# Patient Record
Sex: Female | Born: 1990 | Race: White | Hispanic: No | Marital: Married | Smoking: Never smoker
Health system: Southern US, Community
[De-identification: ages and names within clinical notes are randomized; demographics above are authoritative.]

## PROBLEM LIST (undated history)

## (undated) ENCOUNTER — Inpatient Hospital Stay (HOSPITAL_COMMUNITY): Payer: Self-pay

## (undated) DIAGNOSIS — N83209 Unspecified ovarian cyst, unspecified side: Secondary | ICD-10-CM

## (undated) DIAGNOSIS — F419 Anxiety disorder, unspecified: Secondary | ICD-10-CM

## (undated) DIAGNOSIS — K219 Gastro-esophageal reflux disease without esophagitis: Secondary | ICD-10-CM

## (undated) DIAGNOSIS — T7840XA Allergy, unspecified, initial encounter: Secondary | ICD-10-CM

## (undated) DIAGNOSIS — I493 Ventricular premature depolarization: Secondary | ICD-10-CM

## (undated) DIAGNOSIS — R51 Headache: Secondary | ICD-10-CM

## (undated) DIAGNOSIS — E079 Disorder of thyroid, unspecified: Secondary | ICD-10-CM

## (undated) DIAGNOSIS — J302 Other seasonal allergic rhinitis: Secondary | ICD-10-CM

## (undated) DIAGNOSIS — K8020 Calculus of gallbladder without cholecystitis without obstruction: Secondary | ICD-10-CM

## (undated) DIAGNOSIS — J40 Bronchitis, not specified as acute or chronic: Secondary | ICD-10-CM

## (undated) DIAGNOSIS — J45909 Unspecified asthma, uncomplicated: Secondary | ICD-10-CM

## (undated) DIAGNOSIS — M1990 Unspecified osteoarthritis, unspecified site: Secondary | ICD-10-CM

## (undated) DIAGNOSIS — F32A Depression, unspecified: Secondary | ICD-10-CM

## (undated) DIAGNOSIS — F329 Major depressive disorder, single episode, unspecified: Secondary | ICD-10-CM

## (undated) DIAGNOSIS — I491 Atrial premature depolarization: Secondary | ICD-10-CM

## (undated) HISTORY — PX: DERMOID CYST  EXCISION: SHX1452

## (undated) HISTORY — DX: Unspecified osteoarthritis, unspecified site: M19.90

## (undated) HISTORY — DX: Atrial premature depolarization: I49.1

## (undated) HISTORY — PX: OTHER SURGICAL HISTORY: SHX169

## (undated) HISTORY — DX: Allergy, unspecified, initial encounter: T78.40XA

## (undated) HISTORY — PX: DIAGNOSTIC LAPAROSCOPY: SUR761

## (undated) HISTORY — DX: Gastro-esophageal reflux disease without esophagitis: K21.9

## (undated) HISTORY — DX: Disorder of thyroid, unspecified: E07.9

## (undated) HISTORY — DX: Calculus of gallbladder without cholecystitis without obstruction: K80.20

## (undated) HISTORY — DX: Ventricular premature depolarization: I49.3

---
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
## 2010-05-09 ENCOUNTER — Ambulatory Visit: Payer: Self-pay | Admitting: Women's Health

## 2010-12-26 ENCOUNTER — Other Ambulatory Visit: Payer: Self-pay | Admitting: *Deleted

## 2010-12-26 DIAGNOSIS — Z3049 Encounter for surveillance of other contraceptives: Secondary | ICD-10-CM

## 2010-12-26 MED ORDER — ETONOGESTREL 68 MG ~~LOC~~ IMPL: DRUG_IMPLANT | Freq: Once | SUBCUTANEOUS | Status: DC

## 2011-01-23 ENCOUNTER — Telehealth: Payer: Self-pay

## 2011-01-23 NOTE — Telephone Encounter (Signed)
PT. C-O DARK HEAVY BLEEDING WITH CRAMPS THAT ARE VARYING IN INTENSITY. I LEFT A VOICE MAIL @ 11:39 A.M. TO GET MORE DETAIL TO HELP PT. SHE CALLED BACK AGAIN @ 11:40 A.M. IN MY VOICE MAIL AND DID NOT LEAVE ANY NEW INFO. FOR ME. FYI PT HAS AN AEX WITH YOU 02-14-11 & ON 02-15-11 REMOVAL & INSERTION OF NEXPLANON WITH DR. Reece Agar. NOT SURE WHAT TO DO SINCE SHE IS NOT GIVING MORE INFO.

## 2011-01-23 NOTE — Telephone Encounter (Signed)
Sherri, please call her and have her take 2-3 over-the-counter Motrin every 6 hours today to see if that will help slow down her menses and help with her cramps.

## 2011-01-23 NOTE — Telephone Encounter (Signed)
PT. NOTIFIED AND BLEEDING HAS SLOWED DOWN FOR NOW. SHE CAN NOT COME BACK FROM [place] CURRENTLY; BUT WILL GO TO [facility] [place] IF SYMPTOMS WORSEN OR PERSIST AGAIN UNTIL SHE CAN COME IN FOR HER APPTS NEXT MONTH AT OUR OFFICE.

## 2011-02-07 ENCOUNTER — Encounter: Payer: Self-pay | Admitting: Gynecology

## 2011-02-14 ENCOUNTER — Encounter: Payer: Self-pay | Admitting: Women's Health

## 2011-02-15 ENCOUNTER — Telehealth: Payer: Self-pay | Admitting: *Deleted

## 2011-02-15 ENCOUNTER — Encounter: Payer: Self-pay | Admitting: Obstetrics and Gynecology

## 2011-02-15 ENCOUNTER — Ambulatory Visit (INDEPENDENT_AMBULATORY_CARE_PROVIDER_SITE_OTHER): Payer: 59 | Admitting: Obstetrics and Gynecology

## 2011-02-15 VITALS — BP 120/80

## 2011-02-15 DIAGNOSIS — Z3046 Encounter for surveillance of implantable subdermal contraceptive: Secondary | ICD-10-CM

## 2011-02-15 DIAGNOSIS — N938 Other specified abnormal uterine and vaginal bleeding: Secondary | ICD-10-CM

## 2011-02-15 DIAGNOSIS — N83209 Unspecified ovarian cyst, unspecified side: Secondary | ICD-10-CM | POA: Insufficient documentation

## 2011-02-15 DIAGNOSIS — Z309 Encounter for contraceptive management, unspecified: Secondary | ICD-10-CM

## 2011-02-15 DIAGNOSIS — N949 Unspecified condition associated with female genital organs and menstrual cycle: Secondary | ICD-10-CM

## 2011-02-15 MED ORDER — HYDROCODONE-ACETAMINOPHEN 5-500 MG PO TABS: 1.0000 | ORAL_TABLET | Freq: Three times a day (TID) | ORAL | Status: DC | PRN

## 2011-02-15 NOTE — Telephone Encounter (Signed)
Patient informed below.  There is no numbness in her arm and not badly swollen.  Just said she had a lot of pain having it but in.

## 2011-02-15 NOTE — Telephone Encounter (Signed)
Patient called c/o of lots of pain with arm since insert of Nexplanon.  Wants to know if we could call in an RX. Had gotten a Vicodin from grandmother since procedure and has started to help.  Can we call in an RX for pain?

## 2011-02-15 NOTE — Telephone Encounter (Signed)
Okay to call and Vicodin 5 mg. Giver numbers 15. Does she have any numbness in her arm? Is her arm badly  swollen?

## 2011-02-15 NOTE — Progress Notes (Signed)
Patient came to see me today to have her old Nexplanon removed and a new one inserted. She is that her current one now for 3 years. Initially she had amenorrhea. She has had meno menorrhagia recently. She saw someone at the [place] where she lives. She has a ovarian cyst and a possible endometrial polyp and is scheduled for followup ultrasound in 3 more weeks. She is currently on a pack of birth control pills to try to control bleeding. She says her bleeding has reduced.  Her old implant is easily palpable. The area was prepped and draped in a sterile manner with Betadine. One percent plain Xylocaine was used to anesthetize the skin. A small incision was made over the distal part of the implant. The implant could be delivered into the incision. It was then removed. It was measured and was the full 4 cm. It was clearly intact. The patient and I discussed a new implant. I offered her a Mirena IUD as an option to better control bleeding. She instead wanted a new Nexplanon placed. She signed the informed consent. Additional Xylocaine was used on the skin. A new Nexplanon was inserted with ease very superficially. Both the patient and I can easily palpate it. A Steri-Strip was placed. A bandage was applied. The patient was given postoperative instructions. I told her that either Dr. Brayton Layman or I would be happy to help her with the ovarian cyst if she would like.

## 2011-02-15 NOTE — Progress Notes (Signed)
Addended by: Bertram Savin A on: 02/15/2011 10:27 AM   Modules accepted: Orders

## 2011-02-16 ENCOUNTER — Encounter (HOSPITAL_COMMUNITY): Payer: Self-pay

## 2011-02-16 ENCOUNTER — Inpatient Hospital Stay (HOSPITAL_COMMUNITY)
Admission: AD | Admit: 2011-02-16 | Discharge: 2011-02-17 | Disposition: A | Discharge status: Home / Self Care | Payer: 59 | Source: Ambulatory Visit | Attending: Gynecology | Admitting: Gynecology

## 2011-02-16 DIAGNOSIS — T859XXA Unspecified complication of internal prosthetic device, implant and graft, initial encounter: Secondary | ICD-10-CM | POA: Insufficient documentation

## 2011-02-16 DIAGNOSIS — R51 Headache: Secondary | ICD-10-CM | POA: Insufficient documentation

## 2011-02-16 HISTORY — DX: Unspecified ovarian cyst, unspecified side: N83.209

## 2011-02-16 HISTORY — DX: Major depressive disorder, single episode, unspecified: F32.9

## 2011-02-16 HISTORY — DX: Depression, unspecified: F32.A

## 2011-02-16 MED ORDER — IBUPROFEN 600 MG PO TABS
600.0000 mg | ORAL_TABLET | Freq: Once | ORAL | Status: AC
  Administered 2011-02-16: 600 mg via ORAL
  Filled 2011-02-16: qty 1

## 2011-02-16 MED ORDER — OXYCODONE-ACETAMINOPHEN 5-325 MG PO TABS
2.0000 | ORAL_TABLET | Freq: Once | ORAL | Status: AC
  Administered 2011-02-16: 2 via ORAL
  Filled 2011-02-16: qty 2

## 2011-02-16 MED ORDER — IBUPROFEN 600 MG PO TABS: 600.0000 mg | ORAL_TABLET | Freq: Four times a day (QID) | ORAL | Status: AC | PRN

## 2011-02-16 MED ORDER — OXYCODONE-ACETAMINOPHEN 5-325 MG PO TABS: 2.0000 | ORAL_TABLET | Freq: Four times a day (QID) | ORAL | Status: AC | PRN

## 2011-02-16 NOTE — Progress Notes (Signed)
Pt also states, " Im also having a headache since he put the implant in, and I've taken Vicodin, but it isn't helping."

## 2011-02-16 NOTE — Progress Notes (Signed)
Patient is here with c/o headache, pain in site of left inner forearm where the nexplanon was placed yesterday by dr Kem Kays. She is upset that her regular dr Lily Peer didn't do the procedure, she states that she felt rushed and was not numbed appropraitely. She states that she had implanon before and didn't have any issues. She has ace wrap wrapped around the site, unable to visualize the nexplanon placement site. She states that she is moving to another city for 2 months and has an appointment with a gyn doctor there for ovarian cyst. Capillary reflex is normal on left arm, it is warm and has normal color.

## 2011-02-16 NOTE — Progress Notes (Signed)
Pt states, " I had nexplanon inserted yesterday by Dr. Oletha Blend. Last night I had swelling in my left arm and my hand were numb and is pale. We unwrapped the bandage and loosed it and it helped some, and the rod is too superficial, and there is a below the incision."

## 2011-02-16 NOTE — ED Provider Notes (Signed)
History   The patient is a 20 y.o. year old female who presents to [facility] reporting pain and swelling at insertion sign of Nexplanon inserted yesterday but Dr. Dianne Dun. She also reports HA and numbness in her hand, both resolved. She denies fever and chills. She had Implanon previously w/out complication. She has taken Ibuprofen today w/ no relief of pain. She called her Dr's office this afternoon and was Rx'd Vicodin. She took one w/ no improvement. She does not currently live in town and will be returning home tomorrow. She has an appointment w/ her Gyn at home on 02/26/11 for F/U ovarian cysts.   CSN: [id number] Arrival date & time: 02/16/2011  8:44 PM  Chief Complaint  Patient presents with  . Post-op Problem  . Headache    (Consider location/radiation/quality/duration/timing/severity/associated sxs/prior treatment) HPI  Past Medical History  Diagnosis Date  . Thyroid disease     Hypothyroid  . Endometriosis   . Ovarian cyst   . Depression     History reviewed. No pertinent past surgical history.  Family History  Problem Relation Age of Onset  . Heart disease Mother     History  Substance Use Topics  . Smoking status: Never Smoker   . Smokeless tobacco: Never Used  . Alcohol Use: No    OB History    Grav Para Term Preterm Abortions TAB SAB Ect Mult Living                  Review of Systems  All other systems reviewed and are negative.    Allergies  Seroquel  Home Medications  No current outpatient prescriptions on file.  BP 132/82  Pulse 69  Temp(Src) 99.1 F (37.3 C) (Oral)  Resp 20  Ht 5' 7.5" (1.715 m)  Wt 85.787 kg (189 lb 2 oz)  BMI 29.18 kg/m2  LMP 01/21/2011  Physical Exam  Constitutional: She is oriented to person, place, and time. She appears well-developed and well-nourished. She appears distressed (mild).  Neck: Normal range of motion.  Cardiovascular: Normal rate.   Pulmonary/Chest: Effort normal.  Neurological: She is alert and  oriented to person, place, and time.  Skin: Skin is warm and dry. Bruising and ecchymosis noted. No erythema. No pallor.          Outer dressing removed. Small pressure dressing left in place. C/D/I. Nexplanon device visible and easily palpable through the skin. Moderate tenderness. No swelling .  Psychiatric: Her mood appears anxious.    ED Course  Procedures (including critical care time)   MDM  Assessment: 1. Shallow Nexplanon placement  Plan: 1. Per consult w/ Dr Nicholas Lose, offer removal vs expectant management w/ pain management. Pt elects to leave Nexplanon in place 2. Call Gyn on Monday to schedule F/U in 1 week or sooner PRN for worsening pain. 3. Rx Ibuprofen and Percocet #30. 4. Infection precautions reviewed   Dorathy Kinsman 02/16/2011 11:46 PM

## 2011-12-20 ENCOUNTER — Emergency Department (HOSPITAL_COMMUNITY): Payer: Worker's Compensation | Attending: Emergency Medicine

## 2011-12-20 ENCOUNTER — Emergency Department (HOSPITAL_COMMUNITY)
Admission: EM | Admit: 2011-12-20 | Discharge: 2011-12-20 | Disposition: A | Discharge status: Home / Self Care | Payer: Worker's Compensation | Attending: Emergency Medicine | Admitting: Emergency Medicine

## 2011-12-20 ENCOUNTER — Encounter (HOSPITAL_COMMUNITY): Payer: Self-pay | Admitting: Emergency Medicine

## 2011-12-20 DIAGNOSIS — M25539 Pain in unspecified wrist: Secondary | ICD-10-CM

## 2011-12-20 DIAGNOSIS — M25579 Pain in unspecified ankle and joints of unspecified foot: Secondary | ICD-10-CM | POA: Insufficient documentation

## 2011-12-20 DIAGNOSIS — X500XXA Overexertion from strenuous movement or load, initial encounter: Secondary | ICD-10-CM | POA: Insufficient documentation

## 2011-12-20 DIAGNOSIS — F329 Major depressive disorder, single episode, unspecified: Secondary | ICD-10-CM | POA: Insufficient documentation

## 2011-12-20 DIAGNOSIS — F3289 Other specified depressive episodes: Secondary | ICD-10-CM | POA: Insufficient documentation

## 2011-12-20 HISTORY — DX: Unspecified asthma, uncomplicated: J45.909

## 2011-12-20 MED ORDER — HYDROCODONE-ACETAMINOPHEN 5-325 MG PO TABS: 1.0000 | ORAL_TABLET | ORAL | Status: AC | PRN

## 2011-12-20 MED ORDER — IBUPROFEN 800 MG PO TABS: 800.0000 mg | ORAL_TABLET | Freq: Three times a day (TID) | ORAL | Status: AC | PRN

## 2011-12-20 MED ORDER — IBUPROFEN 800 MG PO TABS
800.0000 mg | ORAL_TABLET | Freq: Once | ORAL | Status: AC
  Administered 2011-12-20: 800 mg via ORAL
  Filled 2011-12-20: qty 1

## 2011-12-20 NOTE — ED Provider Notes (Signed)
Medical screening examination/treatment/procedure(s) were performed by non-physician practitioner and as supervising physician I was immediately available for consultation/collaboration.   Adryan Druckenmiller, MD 12/20/11 2357 

## 2011-12-20 NOTE — ED Notes (Signed)
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

## 2011-12-20 NOTE — ED Provider Notes (Signed)
History     CSN: [id number]  Arrival date & time 12/20/11  2007   First MD Initiated Contact with Patient 12/20/11 2154      Chief Complaint  Patient presents with  . 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  States the pain and "pop" involved the middle of her dorsal wrist.  Reports pain on dorsal and ventral aspects of wrist, centrally.  States her entire hand feels tingly.  Denies weakness of her hand.  Denies other injury.    The history is provided by the patient. The history is limited by the condition of the patient.    Past Medical History  Diagnosis Date  . Thyroid disease     Hypothyroid  . Endometriosis   . Ovarian cyst   . Depression   . Asthma     Past Surgical History  Procedure Date  . Dermoid cyst  excision     Family History  Problem Relation Age of Onset  . Heart disease Mother   . Hypertension Mother   . Hyperlipidemia Father   . Diabetes Other   . Hypertension Other     History  Substance Use Topics  . Smoking status: Never Smoker   . Smokeless tobacco: Never Used  . Alcohol Use: No    OB History    Grav Para Term Preterm Abortions TAB SAB Ect Mult Living                  Review of Systems  Skin: Negative for color change, pallor and wound.  Neurological: Negative for weakness and numbness.    Allergies  Seroquel  Home Medications   Current Outpatient Rx  Name Route Sig Dispense Refill  . ALPRAZOLAM 0.5 MG PO TABS Oral Take 0.5 mg by mouth at bedtime as needed. Anxiety    . DIPHENHYDRAMINE HCL 25 MG PO CAPS Oral Take 25 mg by mouth daily. Patient takes for allergies     . MONTELUKAST SODIUM 10 MG PO TABS Oral Take 10 mg by mouth at bedtime.      . SERTRALINE HCL 100 MG PO TABS Oral Take 100 mg by mouth  daily.      BP 124/96  Pulse 86  Temp 98.9 F (37.2 C) (Oral)  Resp 20  SpO2 100%  Physical Exam  Nursing note and vitals reviewed. Constitutional: She appears well-developed and well-nourished. No distress.  HENT:  Head: Normocephalic and atraumatic.  Neck: Neck supple.  Pulmonary/Chest: Effort normal.  Musculoskeletal:       Right wrist: She exhibits tenderness and swelling. She exhibits normal range of motion, no bony tenderness, no crepitus, no deformity and no laceration.       Pt with central tenderness over dorsal wrist.  Pt with full AROM of wrist.  Radial pulse is intact.  Grip strength is normal.  No bony tenderness of hand.    Neurological: She is alert.  Skin: She is not diaphoretic.    ED Course  Procedures (including critical care time)  Labs Reviewed - No data to display Dg Wrist Complete Right  12/20/2011  *RADIOLOGY REPORT*  Clinical Data: pain, trauma  RIGHT WRIST - COMPLETE 3+ VIEW  Comparison: The wrist films 06/11/2007  Findings: Radiocarpal joint is intact.  No evidence of carpal fracture.  No  dislocation.  IMPRESSION: No wrist fracture.  Original Report Authenticated By: Genevive Bi, M.D.   Dg Hand Complete Right  12/20/2011  *RADIOLOGY REPORT*  Clinical Data: Wrist pain  RIGHT HAND - COMPLETE 3+ VIEW  Comparison: Wrist films 06/11/2007  Findings: The radiocarpal joint is intact.  No evidence of carpal fracture or metacarpal fracture.  IMPRESSION: No wrist fracture.  No hand fracture.  Original Report Authenticated By: Genevive Bi, M.D.     1. Wrist pain       MDM  Pt with right wrist pain after a child pulled away from her.  No specific or localized bony tenderness. Xrays are negative.  Pt is neurovascularly intact, full AROM.  Pt placed in velcro splint, d/c home with pain medication and PCP resources for follow up.  Discussed all results with patient.  Pt given return precautions.  Pt verbalizes understanding and agrees with plan.            [place], [place] 12/20/11 2356

## 2012-04-05 ENCOUNTER — Ambulatory Visit (INDEPENDENT_AMBULATORY_CARE_PROVIDER_SITE_OTHER): Payer: PRIVATE HEALTH INSURANCE | Admitting: Family Medicine

## 2012-04-05 VITALS — BP 120/80 | HR 85 | Temp 98.1°F | Resp 16 | Ht 67.5 in | Wt 196.0 lb

## 2012-04-05 DIAGNOSIS — N390 Urinary tract infection, site not specified: Secondary | ICD-10-CM

## 2012-04-05 DIAGNOSIS — R300 Dysuria: Secondary | ICD-10-CM

## 2012-04-05 DIAGNOSIS — J069 Acute upper respiratory infection, unspecified: Secondary | ICD-10-CM

## 2012-04-05 LAB — POCT URINALYSIS DIPSTICK
Glucose, UA: NEGATIVE
Ketones, UA: NEGATIVE
Spec Grav, UA: 1.015

## 2012-04-05 LAB — POCT UA - MICROSCOPIC ONLY: Mucus, UA: NEGATIVE

## 2012-04-05 MED ORDER — PHENAZOPYRIDINE HCL 200 MG PO TABS: 200.0000 mg | ORAL_TABLET | Freq: Three times a day (TID) | ORAL | Status: DC | PRN

## 2012-04-05 MED ORDER — FLUCONAZOLE 150 MG PO TABS: 150.0000 mg | ORAL_TABLET | Freq: Once | ORAL | Status: DC

## 2012-04-05 MED ORDER — AMOXICILLIN 500 MG PO TABS: 500.0000 mg | ORAL_TABLET | Freq: Three times a day (TID) | ORAL | Status: DC

## 2012-04-05 NOTE — Progress Notes (Signed)
Subjective:    Patient ID: Erika Armstrong, female    DOB: [birth year], 21 y.o.   MRN: [id number] Chief Complaint  Patient presents with  . Urinary Tract Infection    2 weeks  . Sore Throat    1 week  . URI    1 week    HPI  Has had for 2 wks w/ dysuria and constant urge w/ hesitancy - gets several times a yr. Also gets freq yeast infection w/ occ itching, no discharge. Has chronic constipation, sexually active, no douching or bubble baths.  Had some Bactrim at home from prior infection - took 1 pill twice a day for a wk which helped some but sxs did not totally go away and since she went off it, sxs has gotten much worse. Tried her on some meds when she was little but nothing ever worked for her. Dev nasal cong a wk ago but progressed into pharyngitis and laryngitis. Having yellow mucous and productive cough w/ chest pain.  Sleeping ok.  No using any otc meds other than benadryl which she takes normally. Also had some Rt ear pressure. Past Medical History  Diagnosis Date  . Thyroid disease     Hypothyroid  . Endometriosis   . Ovarian cyst   . Depression   . Asthma   . Allergy   . Arthritis    Current Outpatient Prescriptions on File Prior to Visit  Medication Sig Dispense Refill  . ALPRAZolam (XANAX) 0.5 MG tablet Take 0.5 mg by mouth at bedtime as needed. Anxiety      . diphenhydrAMINE (BENADRYL) 25 mg capsule Take 25 mg by mouth daily. Patient takes for allergies       . montelukast (SINGULAIR) 10 MG tablet Take 10 mg by mouth at bedtime.        . sertraline (ZOLOFT) 100 MG tablet Take 100 mg by mouth daily.       Current Facility-Administered Medications on File Prior to Visit  Medication Dose Route Frequency Provider Last Rate Last Dose  . Etonogestrel IMPL   Subcutaneous Once Trellis Paganini, MD        Review of Systems  Constitutional: Negative for fever, chills, diaphoresis, activity change, appetite change, fatigue and unexpected weight change.  HENT: Positive for  ear pain, congestion, sore throat, rhinorrhea, trouble swallowing and voice change.   Respiratory: Positive for cough. Negative for shortness of breath and wheezing.   Cardiovascular: Positive for chest pain.  Gastrointestinal: Positive for constipation. Negative for abdominal pain, diarrhea, blood in stool, anal bleeding and rectal pain.  Genitourinary: Positive for dysuria, urgency and difficulty urinating. Negative for frequency, hematuria, decreased urine volume, vaginal bleeding, vaginal discharge, genital sores, vaginal pain, menstrual problem, pelvic pain and dyspareunia.  Musculoskeletal: Negative for gait problem.  Skin: Negative for rash.  Hematological: Negative for adenopathy.  Psychiatric/Behavioral: Negative for sleep disturbance. The patient is not nervous/anxious.       BP 120/80  Pulse 85  Temp(Src) 98.1 F (36.7 C) (Oral)  Resp 16  Ht 5' 7.5" (1.715 m)  Wt 196 lb (88.905 kg)  BMI 30.23 kg/m2  SpO2 100% Objective:   Physical Exam  Constitutional: She is oriented to person, place, and time. She appears well-developed and well-nourished. No distress.  HENT:  Head: Normocephalic and atraumatic.  Right Ear: Tympanic membrane, external ear and ear canal normal.  Left Ear: Tympanic membrane, external ear and ear canal normal.  Nose: Nose normal. No mucosal edema or rhinorrhea.  Mouth/Throat: Uvula is midline, oropharynx is clear and moist and mucous membranes are normal. No oropharyngeal exudate.  Eyes: Conjunctivae are normal. Right eye exhibits no discharge. Left eye exhibits no discharge. No scleral icterus.  Neck: Normal range of motion. Neck supple.  Cardiovascular: Normal rate, regular rhythm, normal heart sounds and intact distal pulses.   Pulmonary/Chest: Effort normal and breath sounds normal.  Abdominal: Soft. Bowel sounds are normal. She exhibits no distension and no mass. There is no hepatosplenomegaly. There is tenderness. There is no rebound, no guarding and  no CVA tenderness.  Lymphadenopathy:    She has no cervical adenopathy.  Neurological: She is alert and oriented to person, place, and time.  Skin: Skin is warm and dry. She is not diaphoretic. No erythema.  Psychiatric: She has a normal mood and affect. Her behavior is normal.      Results for orders placed in visit on 04/05/12  URINE CULTURE      Result Value Range   Colony Count 40,000 COLONIES/ML     Organism ID, Bacteria Multiple bacterial morphotypes present, none     Organism ID, Bacteria predominant. Suggest appropriate recollection if      Organism ID, Bacteria clinically indicated.    POCT URINALYSIS DIPSTICK      Result Value Range   Color, UA yellow     Clarity, UA cloudy     Glucose, UA neg     Bilirubin, UA neg     Ketones, UA neg     Spec Grav, UA 1.015     Blood, UA small     pH, UA 5.0     Protein, UA neg     Urobilinogen, UA 0.2     Nitrite, UA neg     Leukocytes, UA large (3+)    POCT UA - MICROSCOPIC ONLY      Result Value Range   WBC, Ur, HPF, POC 30-40     RBC, urine, microscopic 3-6     Bacteria, U Microscopic 1+     Mucus, UA neg     Epithelial cells, urine per micros 5-8     Crystals, Ur, HPF, POC neg     Casts, Ur, LPF, POC neg     Yeast, UA positive      Assessment & Plan:  Dysuria - Plan: POCT urinalysis dipstick, POCT UA - Microscopic Only, Urine culture, phenazopyridine (PYRIDIUM) 200 MG tablet  UTI (lower urinary tract infection) - Plan: amoxicillin (AMOXIL) 500 MG tablet  URI (upper respiratory infection)  Meds ordered this encounter  Medications  . amoxicillin (AMOXIL) 500 MG tablet    Sig: Take 1 tablet (500 mg total) by mouth 3 (three) times daily.    Dispense:  21 tablet    Refill:  0  . phenazopyridine (PYRIDIUM) 200 MG tablet    Sig: Take 1 tablet (200 mg total) by mouth 3 (three) times daily as needed for pain.    Dispense:  10 tablet    Refill:  0  .  fluconazole (DIFLUCAN) 150 MG tablet    Sig: Take 1 tablet (150 mg  total) by mouth once. Repeat if needed    Dispense:  2 tablet    Refill:  0

## 2012-04-06 LAB — URINE CULTURE

## 2012-04-12 ENCOUNTER — Telehealth: Payer: Self-pay

## 2012-04-12 NOTE — Telephone Encounter (Signed)
Patient was being treated with antibiotics for UTI and URI. She is out of antibiotic and still has symptoms. Dr Clelia Croft ordered labs to make sure it actually is a UTI but patient has not heard back about results. She would like to know what to do next. (Dr Clelia Croft will be out of office for the week)  Best [phone]

## 2012-04-12 NOTE — Telephone Encounter (Signed)
If she still has UTI symptoms - she should RTC because she may have a vaginal infection that is causing her urinary symptoms.

## 2012-04-12 NOTE — Telephone Encounter (Signed)
Please check lab results. They are back and in Dr. Clelia Croft box and she is out of the office.

## 2012-04-12 NOTE — Telephone Encounter (Signed)
Pt states that she is still coughing and having a runny nose, urinary frequency and a "ting".  Per Maralyn Sago get Mucinex and RTC since she may vaginal infection. Advised pt to RTC.

## 2012-04-24 ENCOUNTER — Telehealth: Payer: Self-pay | Admitting: *Deleted

## 2012-04-24 ENCOUNTER — Other Ambulatory Visit: Payer: Self-pay | Admitting: Gynecology

## 2012-04-24 ENCOUNTER — Encounter: Payer: Self-pay | Admitting: Gynecology

## 2012-04-24 ENCOUNTER — Ambulatory Visit (INDEPENDENT_AMBULATORY_CARE_PROVIDER_SITE_OTHER): Payer: PRIVATE HEALTH INSURANCE | Admitting: Gynecology

## 2012-04-24 ENCOUNTER — Telehealth: Payer: Self-pay | Admitting: Gynecology

## 2012-04-24 VITALS — BP 124/80

## 2012-04-24 DIAGNOSIS — N898 Other specified noninflammatory disorders of vagina: Secondary | ICD-10-CM

## 2012-04-24 DIAGNOSIS — D279 Benign neoplasm of unspecified ovary: Secondary | ICD-10-CM | POA: Insufficient documentation

## 2012-04-24 DIAGNOSIS — Z86018 Personal history of other benign neoplasm: Secondary | ICD-10-CM

## 2012-04-24 DIAGNOSIS — R102 Pelvic and perineal pain: Secondary | ICD-10-CM

## 2012-04-24 DIAGNOSIS — Z3049 Encounter for surveillance of other contraceptives: Secondary | ICD-10-CM

## 2012-04-24 DIAGNOSIS — Z23 Encounter for immunization: Secondary | ICD-10-CM

## 2012-04-24 DIAGNOSIS — N949 Unspecified condition associated with female genital organs and menstrual cycle: Secondary | ICD-10-CM

## 2012-04-24 DIAGNOSIS — R350 Frequency of micturition: Secondary | ICD-10-CM

## 2012-04-24 DIAGNOSIS — L259 Unspecified contact dermatitis, unspecified cause: Secondary | ICD-10-CM

## 2012-04-24 DIAGNOSIS — Z9889 Other specified postprocedural states: Secondary | ICD-10-CM

## 2012-04-24 LAB — URINALYSIS W MICROSCOPIC + REFLEX CULTURE
Casts: NONE SEEN
Crystals: NONE SEEN
Glucose, UA: NEGATIVE mg/dL
Hgb urine dipstick: NEGATIVE
Leukocytes, UA: NEGATIVE
Specific Gravity, Urine: 1.02 (ref 1.005–1.030)
WBC, UA: NONE SEEN WBC/hpf (ref <3)
pH: 7.5 (ref 5.0–8.0)

## 2012-04-24 LAB — WET PREP FOR TRICH, YEAST, CLUE
Clue Cells Wet Prep HPF POC: NONE SEEN
Yeast Wet Prep HPF POC: NONE SEEN

## 2012-04-24 LAB — CBC WITH DIFFERENTIAL/PLATELET
Basophils Absolute: 0 10*3/uL (ref 0.0–0.1)
Basophils Relative: 0 % (ref 0–1)
MCHC: 34.6 g/dL (ref 30.0–36.0)
Monocytes Absolute: 0.7 10*3/uL (ref 0.1–1.0)
Neutro Abs: 4.9 10*3/uL (ref 1.7–7.7)
Neutrophils Relative %: 60 % (ref 43–77)
RDW: 13.1 % (ref 11.5–15.5)

## 2012-04-24 MED ORDER — FLUCONAZOLE 150 MG PO TABS: 150.0000 mg | ORAL_TABLET | Freq: Once | ORAL | Status: DC

## 2012-04-24 MED ORDER — LEVONORGESTREL 20 MCG/24HR IU IUD: INTRAUTERINE_SYSTEM | Freq: Once | INTRAUTERINE | Status: DC

## 2012-04-24 MED ORDER — HYDROCORTISONE VALERATE 0.2 % EX OINT: TOPICAL_OINTMENT | Freq: Two times a day (BID) | CUTANEOUS | Status: DC

## 2012-04-24 NOTE — Telephone Encounter (Signed)
Reassure her that it should be no problem.

## 2012-04-24 NOTE — Telephone Encounter (Signed)
Pt informed with the below. 

## 2012-04-24 NOTE — Telephone Encounter (Signed)
Pt advised that Mirena & insertion covered under her $25 copay per [employer]. JF to insert on 04/27/12/wl

## 2012-04-24 NOTE — Telephone Encounter (Signed)
Pt was seen today and discussed removing nexplanon IUD in the near future and placing a Mirena IUD. Pt said she forgot to mention to you that she is a 2nd generation DES baby would that affect anything? As well has her uterus being anteverted? She is worried about placement of IUD issues. Please advise

## 2012-04-24 NOTE — Progress Notes (Addendum)
Patient is a 21 year old who presented to the office with several complaints. The first concern of her cyst is her left upper, she had a nexplanon placed that she feels is very superficial and at times causes her irritation. She is having no menstrual periods. She is sexually active. She also had symptoms of frequency and this year he and she stated that she had some antibiotic at home that she took twice a day for one week. Her symptoms continued and she went to an urgent care and they placed on amoxicillin twice a day for one week. They had also given her some anti-spasmodic agent which she does not recall the name and last night when she took it it made her sick her stomach with some nausea and vomiting. She was also complaining of some vulvar pruritus and she had been taking so much antibiotic. She states that occasionally she has had dyspareunia.  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 Patient denied any fever recently.  Exam: Left upper arm medial aspect nexplanon wasn't visible superficial underneath the skin with the distal portion of the rod causing perhaps some mild irritation.  Abdomen: Soft nontender no rebound or guarding although her pelvis slight tenderness more on the right than on the left. Pelvic exam: Bartholin urethra Skene glands within normal limits Vagina: Cranial-like discharge was noted so wet prep along with a GC and Chlamydia culture was obtained Cervix: As above Uterus: Anteverted slightly tender but no discernible mass per se on either adnexa. Rectal exam deferred.  Patient received flu vaccine today Urine pregnancy test pending at time of this dictation Wet prep negative  Assessment/plan: Patient has been on antibiotics for almost 2 weeks. We will wait for the urine culture as well as the GC and Chlamydia culture. 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 I've given her literature information on the Mirena IUD. I've called in a prescription for [place] cream to apply to her upper arm for the contact dermatitis. We discussed about removing her nexplanon IUD in the near future and placing a Mirena IUD intrauterine instead.  UPT negative

## 2012-04-24 NOTE — Patient Instructions (Addendum)
Intrauterine Device Information  An intrauterine device (IUD) is inserted into your uterus and prevents pregnancy. There are 2 types of IUDs available:  · Copper IUD. This type of IUD is wrapped in copper wire and is placed inside the uterus. Copper makes the uterus and fallopian tubes produce a fluid that kills sperm. The copper IUD can stay in place for 10 years.  · Hormone IUD. This type of IUD contains the hormone progestin (synthetic progesterone). The hormone thickens the cervical mucus and prevents sperm from entering the uterus, and it also thins the uterine lining to prevent implantation of a fertilized egg. The hormone can weaken or kill the sperm that get into the uterus. The hormone IUD can stay in place for 5 years.  Your caregiver will make sure you are a good candidate for a contraceptive IUD. Discuss with your caregiver the possible side effects.  ADVANTAGES  · It is highly effective, reversible, long-acting, and low maintenance.  · There are no estrogen-related side effects.  · An IUD can be used when breastfeeding.  · It is not associated with weight gain.  · It works immediately after insertion.  · The copper IUD does not interfere with your female hormones.  · The progesterone IUD can make heavy menstrual periods lighter.  · The progesterone IUD can be used for 5 years.  · The copper IUD can be used for 10 years.  DISADVANTAGES  · The progesterone IUD can be associated with irregular bleeding patterns.  · The copper IUD can make your menstrual flow heavier and more painful.  · You may experience cramping and vaginal bleeding after insertion.  Document Released: 04/02/2004 Document Revised: 07/22/2011 Document Reviewed: 09/01/2010  [employer]® Patient Information ©2013 [employer].

## 2012-04-24 NOTE — Addendum Note (Signed)
Addended by: Richardson Chiquito on: 04/24/2012 02:36 PM   Modules accepted: Orders

## 2012-04-24 NOTE — Addendum Note (Signed)
Addended by: Bertram Savin A on: 04/24/2012 02:19 PM   Modules accepted: Orders

## 2012-04-25 LAB — GC/CHLAMYDIA PROBE AMP: CT Probe RNA: NEGATIVE

## 2012-04-26 LAB — URINE CULTURE: Colony Count: 45000

## 2012-04-27 ENCOUNTER — Telehealth: Payer: Self-pay | Admitting: Gynecology

## 2012-04-27 ENCOUNTER — Encounter: Payer: Self-pay | Admitting: Gynecology

## 2012-04-27 ENCOUNTER — Ambulatory Visit (INDEPENDENT_AMBULATORY_CARE_PROVIDER_SITE_OTHER): Payer: PRIVATE HEALTH INSURANCE | Admitting: Gynecology

## 2012-04-27 ENCOUNTER — Ambulatory Visit (INDEPENDENT_AMBULATORY_CARE_PROVIDER_SITE_OTHER): Payer: PRIVATE HEALTH INSURANCE

## 2012-04-27 ENCOUNTER — Other Ambulatory Visit: Payer: Self-pay | Admitting: Gynecology

## 2012-04-27 DIAGNOSIS — R1900 Intra-abdominal and pelvic swelling, mass and lump, unspecified site: Secondary | ICD-10-CM

## 2012-04-27 DIAGNOSIS — N858 Other specified noninflammatory disorders of uterus: Secondary | ICD-10-CM

## 2012-04-27 DIAGNOSIS — N83202 Unspecified ovarian cyst, left side: Secondary | ICD-10-CM

## 2012-04-27 DIAGNOSIS — N949 Unspecified condition associated with female genital organs and menstrual cycle: Secondary | ICD-10-CM

## 2012-04-27 DIAGNOSIS — R102 Pelvic and perineal pain: Secondary | ICD-10-CM

## 2012-04-27 DIAGNOSIS — N83209 Unspecified ovarian cyst, unspecified side: Secondary | ICD-10-CM

## 2012-04-27 DIAGNOSIS — Z9889 Other specified postprocedural states: Secondary | ICD-10-CM

## 2012-04-27 DIAGNOSIS — D3910 Neoplasm of uncertain behavior of unspecified ovary: Secondary | ICD-10-CM

## 2012-04-27 DIAGNOSIS — N859 Noninflammatory disorder of uterus, unspecified: Secondary | ICD-10-CM

## 2012-04-27 MED ORDER — OXYCODONE-ACETAMINOPHEN 5-500 MG PO CAPS: 1.0000 | ORAL_CAPSULE | ORAL | Status: DC | PRN

## 2012-04-27 NOTE — Addendum Note (Signed)
Addended by: Ok Edwards on: 04/27/2012 11:29 AM   Modules accepted: Orders

## 2012-04-27 NOTE — Telephone Encounter (Signed)
Patient said she was in earlier and had u/s.  She said you gave her pain Rx and told her not to drive with using it.  Procedure today caused pain and she took one and could not drive to work.  Her work is asking her to have doctors note faxed to them for her absence today.  If that is okay with you I will prepare letter for you to sign.

## 2012-04-27 NOTE — Telephone Encounter (Signed)
Yes this will be fine thank you

## 2012-04-27 NOTE — Progress Notes (Signed)
Patient is a 21 year old who was seen in the office on December 13 complaining of some irritation on her upper left forearm where she had been nexplanon put in. She is having no menstrual cycles. She is sexually active. Patient been treated for urinary tract infection at the urgent care. She complained of some vulvar pruritus and had wet prep GC and Chlamydia culture here in our office which was negative.  Patient brought to my attention that in 2012 in [facility] she had a laparoscopic right ovarian cystectomy for dermoid cyst.  For the mild irritation at the nexplanon site she was given a prescription for Westcort cream to apply twice a day for 7-10 days which she just started yesterday.  She presented to the office for an ultrasound due to the fullness noted on exam on recent office visit in tenderness noted. Ultrasound today as follows:  Uterus measures 7 x 4.3 x 2.2 cm with endometrial stripe 1.5 mm. Anteverted uterus displaced to the right. Fluid-filled endometrial cavity. Right ovary normal follicles less than 12 mm. Left ovarian rim of tissue seen with follicles. Left adnexa with a solid cystic mass echogenic avascular measuring 4.9 x 4.1 x 4.6 cm some left ovarian arterial blood flow was seen. Mass was otherwise avascular.  Assessment/plan: Patient prior history of dermoid cyst of right ovary. Patient will return back to the office in 4-6 weeks for followup ultrasound if cyst is persistent we will proceed with laparoscopic ovarian cystectomy of what appears to be another dermoid cysts on this contralateral side from the side where she had had a previous ovarian cystectomy. Literature information on laparoscopy and ovarian cyst provided. We are going to get a medical release so we can review the previous operative note that was done in [facility] last year or where she had a dermoid cyst removed from her right. A CA 125 will be drawn today the limitations discussed  with the patient. We also discussed that at the same time that she is under anesthesia to remove her nexplanon and then on her postop visit we can place a Mirena IUD.

## 2012-04-27 NOTE — Telephone Encounter (Signed)
Letter prepared and faxed. Copy is in e-chart. Patient informed.

## 2012-04-27 NOTE — Patient Instructions (Signed)
Ovarian Cyst The ovaries are small organs that are on each side of the uterus. The ovaries are the organs that produce the female hormones, estrogen and progesterone. An ovarian cyst is a sac filled with fluid that can vary in its size. It is normal for a small cyst to form in women who are in the childbearing age and who have menstrual periods. This type of cyst is called a follicle cyst that becomes an ovulation cyst (corpus luteum cyst) after it produces the women's egg. It later goes away on its own if the woman does not become pregnant. There are other kinds of ovarian cysts that may cause problems and may need to be treated. The most serious problem is a cyst with cancer. It should be noted that menopausal women who have an ovarian cyst are at a higher risk of it being a cancer cyst. They should be evaluated very quickly, thoroughly and followed closely. This is especially true in menopausal women because of the high rate of ovarian cancer in women in menopause. CAUSES AND TYPES OF OVARIAN CYSTS:  FUNCTIONAL CYST: The follicle/corpus luteum cyst is a functional cyst that occurs every month during ovulation with the menstrual cycle. They go away with the next menstrual cycle if the woman does not get pregnant. Usually, there are no symptoms with a functional cyst.  ENDOMETRIOMA CYST: This cyst develops from the lining of the uterus tissue. This cyst gets in or on the ovary. It grows every month from the bleeding during the menstrual period. It is also called a "chocolate cyst" because it becomes filled with blood that turns brown. This cyst can cause pain in the lower abdomen during intercourse and with your menstrual period.  CYSTADENOMA CYST: This cyst develops from the cells on the outside of the ovary. They usually are not cancerous. They can get very big and cause lower abdomen pain and pain with intercourse. This type of cyst can twist on itself, cut off its blood supply and cause severe pain. It  also can easily rupture and cause a lot of pain.  DERMOID CYST: This type of cyst is sometimes found in both ovaries. They are found to have different kinds of body tissue in the cyst. The tissue includes skin, teeth, hair, and/or cartilage. They usually do not have symptoms unless they get very big. Dermoid cysts are rarely cancerous.  POLYCYSTIC OVARY: This is a rare condition with hormone problems that produces many small cysts on both ovaries. The cysts are follicle-like cysts that never produce an egg and become a corpus luteum. It can cause an increase in body weight, infertility, acne, increase in body and facial hair and lack of menstrual periods or rare menstrual periods. Many women with this problem develop type 2 diabetes. The exact cause of this problem is unknown. A polycystic ovary is rarely cancerous.  THECA LUTEIN CYST: Occurs when too much hormone (human chorionic gonadotropin) is produced and over-stimulates the ovaries to produce an egg. They are frequently seen when doctors stimulate the ovaries for invitro-fertilization (test tube babies).  LUTEOMA CYST: This cyst is seen during pregnancy. Rarely it can cause an obstruction to the birth canal during labor and delivery. They usually go away after delivery. SYMPTOMS   Pelvic pain or pressure.  Pain during sexual intercourse.  Increasing girth (swelling) of the abdomen.  Abnormal menstrual periods.  Increasing pain with menstrual periods.  You stop having menstrual periods and you are not pregnant. DIAGNOSIS  The diagnosis can   be made during:  Routine or annual pelvic examination (common).  Ultrasound.  X-ray of the pelvis.  CT Scan.  MRI.  Blood tests. TREATMENT   Treatment may only be to follow the cyst monthly for 2 to 3 months with your caregiver. Many go away on their own, especially functional cysts.  May be aspirated (drained) with a long needle with ultrasound, or by laparoscopy (inserting a tube into  the pelvis through a small incision).  The whole cyst can be removed by laparoscopy.  Sometimes the cyst may need to be removed through an incision in the lower abdomen.  Hormone treatment is sometimes used to help dissolve certain cysts.  Birth control pills are sometimes used to help dissolve certain cysts. HOME CARE INSTRUCTIONS  Follow your caregiver's advice regarding:  Medicine.  Follow up visits to evaluate and treat the cyst.  You may need to come back or make an appointment with another caregiver, to find the exact cause of your cyst, if your caregiver is not a gynecologist.  Get your yearly and recommended pelvic examinations and Pap tests.  Let your caregiver know if you have had an ovarian cyst in the past. SEEK MEDICAL CARE IF:   Your periods are late, irregular, they stop, or are painful.  Your stomach (abdomen) or pelvic pain does not go away.  Your stomach becomes larger or swollen.  You have pressure on your bladder or trouble emptying your bladder completely.  You have painful sexual intercourse.  You have feelings of fullness, pressure, or discomfort in your stomach.  You lose weight for no apparent reason.  You feel generally ill.  You become constipated.  You lose your appetite.  You develop acne.  You have an increase in body and facial hair.  You are gaining weight, without changing your exercise and eating habits.  You think you are pregnant. SEEK IMMEDIATE MEDICAL CARE IF:   You have increasing abdominal pain.  You feel sick to your stomach (nausea) and/or vomit.  You develop a fever that comes on suddenly.  You develop abdominal pain during a bowel movement.  Your menstrual periods become heavier than usual. Document Released: 04/29/2005 Document Revised: 07/22/2011 Document Reviewed: 03/02/2009 Exit[employer] Patient Information 2013 [employer].  Diagnostic Laparoscopy Laparoscopy is a surgical procedure. It is used to  diagnose and treat diseases inside the belly(abdomen). It is usually a brief, common, and relatively simple procedure. The laparoscopeis a thin, lighted, pencil-sized instrument. It is like a telescope. It is inserted into your abdomen through a small cut (incision). Your caregiver can look at the organs inside your body through this instrument. He or she can see if there is anything abnormal. Laparoscopy can be done either in a hospital or outpatient clinic. You may be given a mild sedative to help you relax before the procedure. Once in the operating room, you will be given a drug to make you sleep (general anesthesia). Laparoscopy usually lasts less than 1 hour. After the procedure, you will be monitored in a recovery area until you are stable and doing well. Once you are home, it will take 2 to 3 days to fully recover. RISKS AND COMPLICATIONS  Laparoscopy has relatively few risks. Your caregiver will discuss the risks with you before the procedure. Some problems that can occur include:  Infection.  Bleeding.  Damage to other organs.  Anesthetic side effects. PROCEDURE Once you receive anesthesia, your surgeon inflates the abdomen with a harmless gas (carbon dioxide). This makes the   organs easier to see. The laparoscope is inserted into the abdomen through a small incision. This allows your surgeon to see into the abdomen. Other small instruments are also inserted into the abdomen through other small openings. Many surgeons attach a video camera to the laparoscope to enlarge the view. During a diagnostic laparoscopy, the surgeon may be looking for inflammation, infection, or cancer. Your surgeon may take tissue samples(biopsies). The samples are sent to a specialist in looking at cells and tissue samples (pathologist). The pathologist examines them under a microscope. Biopsies can help to diagnose or confirm a disease. AFTER THE PROCEDURE   The gas is released from inside the abdomen.  The  incisions are closed with stitches (sutures). Because these incisions are small (usually less than 1/2 inch), there is usually minimal discomfort after the procedure. There may be some mild discomfort in the throat. This is from the tube placed in the throat while you were sleeping. You may have some mild abdominal discomfort. There may also be discomfort from the instrument placement incisions in the abdomen.  The recovery time is shortened as long as there are no complications.  You will rest in a recovery room until stable and doing well. As long as there are no complications, you may be allowed to go home. FINDING OUT THE RESULTS OF YOUR TEST Not all test results are available during your visit. If your test results are not back during the visit, make an appointment with your caregiver to find out the results. Do not assume everything is normal if you have not heard from your caregiver or the medical facility. It is important for you to follow up on all of your test results. HOME CARE INSTRUCTIONS   Take all medicines as directed.  Only take over-the-counter or prescription medicines for pain, discomfort, or fever as directed by your caregiver.  Resume daily activities as directed.  Showers are preferred over baths.  You may resume sexual activities in 1 week or as directed.  Do not drive while taking narcotics. SEEK MEDICAL CARE IF:   There is increasing abdominal pain.  There is new pain in the shoulders (shoulder strap areas).  You feel lightheaded or faint.  You have the chills.  You or your child has an oral temperature above 102 F (38.9 C).  There is pus-like (purulent) drainage from any of the wounds.  You are unable to pass gas or have a bowel movement.  You feel sick to your stomach (nauseous) or throw up (vomit). MAKE SURE YOU:   Understand these instructions.  Will watch your condition.  Will get help right away if you are not doing well or get  worse. Document Released: 08/05/2000 Document Revised: 07/22/2011 Document Reviewed: 04/29/2007 ExitCare Patient Information 2013 [employer].   

## 2012-05-14 ENCOUNTER — Encounter: Payer: Self-pay | Admitting: Women's Health

## 2012-05-14 ENCOUNTER — Ambulatory Visit (INDEPENDENT_AMBULATORY_CARE_PROVIDER_SITE_OTHER): Payer: 59 | Admitting: Women's Health

## 2012-05-14 VITALS — BP 108/70 | Ht 68.0 in | Wt 187.5 lb

## 2012-05-14 DIAGNOSIS — F419 Anxiety disorder, unspecified: Secondary | ICD-10-CM

## 2012-05-14 DIAGNOSIS — F411 Generalized anxiety disorder: Secondary | ICD-10-CM

## 2012-05-14 DIAGNOSIS — D279 Benign neoplasm of unspecified ovary: Secondary | ICD-10-CM

## 2012-05-14 DIAGNOSIS — R3915 Urgency of urination: Secondary | ICD-10-CM

## 2012-05-14 DIAGNOSIS — Z975 Presence of (intrauterine) contraceptive device: Secondary | ICD-10-CM

## 2012-05-14 DIAGNOSIS — E079 Disorder of thyroid, unspecified: Secondary | ICD-10-CM

## 2012-05-14 DIAGNOSIS — Z01419 Encounter for gynecological examination (general) (routine) without abnormal findings: Secondary | ICD-10-CM

## 2012-05-14 LAB — URINALYSIS W MICROSCOPIC + REFLEX CULTURE
Bilirubin Urine: NEGATIVE
Casts: NONE SEEN
Crystals: NONE SEEN
Glucose, UA: NEGATIVE mg/dL
Specific Gravity, Urine: >1.03 — ABNORMAL HIGH (ref 1.005–1.030)
pH: 5.5 (ref 5.0–8.0)

## 2012-05-14 LAB — CBC WITH DIFFERENTIAL/PLATELET
Eosinophils Absolute: 0.3 10*3/uL (ref 0.0–0.7)
Eosinophils Relative: 4 % (ref 0–5)
Hemoglobin: 15.9 g/dL — ABNORMAL HIGH (ref 12.0–15.0)
Lymphocytes Relative: 30 % (ref 12–46)
Lymphs Abs: 2.3 10*3/uL (ref 0.7–4.0)
MCH: 31.1 pg (ref 26.0–34.0)
MCV: 87.9 fL (ref 78.0–100.0)
Monocytes Relative: 9 % (ref 3–12)
RBC: 5.11 MIL/uL (ref 3.87–5.11)
WBC: 7.5 10*3/uL (ref 4.0–10.5)

## 2012-05-14 LAB — GLUCOSE, RANDOM: Glucose, Bld: 64 mg/dL — ABNORMAL LOW (ref 70–99)

## 2012-05-14 MED ORDER — ALPRAZOLAM 0.5 MG PO TABS: 0.5000 mg | ORAL_TABLET | Freq: Every evening | ORAL | Status: DC | PRN

## 2012-05-14 NOTE — Patient Instructions (Addendum)

## 2012-05-14 NOTE — Progress Notes (Signed)
Erika Armstrong 02/24/1991 [id number]    History:    The patient presents for annual exam.  Amenorrheic with nexplanon/ upper left arm placed October 2012 by Dr. Eda Paschal. History of irregular cycles prior to nexplanon on. Had Implanon placed 2009 until 2012/amenorrheic. History of a right dermoid cyst removed October 2012 in [place]. History of normal Paps. Gardasil series completed 2008. History of a left ovarian cyst December 2013, scheduled for repeat ultrasound January 2014.   Past medical history, past surgical history, family history and social history were all reviewed and documented in the EPIC chart. Planning marriage June 2014. First/only partner. History of hypothyroidism 2009 no Synthroid since 2010. 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   ROS:  A  ROS was performed and pertinent positives and negatives are included in the history.  Exam:  Filed Vitals:   05/14/12 1005  BP: 108/70    General appearance:  Normal Head/Neck:  Normal, without cervical or supraclavicular adenopathy. Thyroid:  Symmetrical, normal in size, without palpable masses or nodularity. Respiratory  Effort:  Normal  Auscultation:  Clear without wheezing or rhonchi Cardiovascular  Auscultation:  Regular rate, without rubs, murmurs or gallops  Edema/varicosities:  Not grossly evident Abdominal  Soft,nontender, without masses, guarding or rebound.  Liver/spleen:  No organomegaly noted  Hernia:  None appreciated  Skin  Inspection:  Grossly normal left arm superficial nexplanon palpated  Palpation:  Grossly normal Neurologic/psychiatric  Orientation:  Normal with appropriate conversation.  Mood/affect:  Normal  Genitourinary    Breasts: Examined lying and sitting.     Right: Without masses, retractions, discharge or axillary adenopathy.     Left: Without masses, retractions, discharge or axillary adenopathy.   Inguinal/mons:  Normal without inguinal  adenopathy  External genitalia:  Normal  BUS/Urethra/Skene's glands:  Normal  Bladder:  Normal  Vagina:  Normal  Cervix:  Normal  Uterus:   normal in size, shape and contour.  Midline and mobile  Adnexa/parametria:     Rt: Without masses or tenderness.   Lt: No fullness with exam noted .  Anus and perineum: Normal   Assessment/Plan:  22 y.o. engaged [facility] G0 for annual exam with complaint of urinary frequency with urgency without pain.  UA: Trace leukocytes, 3-6 WBCs, few bacteria Nexplanon / October 2012 Left ovarian cyst  Plan: Urine culture pending. History of hypothyroidism, no Synthroid since 2010. TSH, CBC, Pap normal 2012, new screening guidelines reviewed. SBE's, increase exercise and decrease calories for weight loss. MVI daily encouraged. Keep scheduled ultrasound appointment for followup.    Harrington Challenger [facility], 10:33 AM 05/14/2012

## 2012-05-26 ENCOUNTER — Telehealth: Payer: Self-pay

## 2012-05-26 NOTE — Telephone Encounter (Signed)
I left message for patient that Dr. Glenetta Hew had sent me an order for surgery after her last visit  to schedule for first week of Feb.  I now have Feb schedule and tentatively have scheduled 06/18/12 7:30am [facility].  I did remind her that this surgery is tentative pending her ultrasound and seeing if the cyst is still present.  I asked her to call me to confirm the date.

## 2012-05-28 ENCOUNTER — Telehealth: Payer: Self-pay

## 2012-05-28 NOTE — Telephone Encounter (Signed)
Patient called. She got my message regarding surgery being tentatively scheduled.  She said she has started new job and will not be able to do surgery then.  She wants me to cancel it and wait for her to have u/s visit.  She said if cyst persists she will talk with Dr. Glenetta Hew then about possibly postponing a little longer until she has been at her new job a little longer. Surgery cancelled.

## 2012-06-05 ENCOUNTER — Other Ambulatory Visit: Payer: PRIVATE HEALTH INSURANCE

## 2012-06-05 ENCOUNTER — Other Ambulatory Visit: Payer: Self-pay | Admitting: Gynecology

## 2012-06-05 ENCOUNTER — Ambulatory Visit (INDEPENDENT_AMBULATORY_CARE_PROVIDER_SITE_OTHER): Payer: 59 | Admitting: Gynecology

## 2012-06-05 ENCOUNTER — Ambulatory Visit: Payer: PRIVATE HEALTH INSURANCE | Admitting: Gynecology

## 2012-06-05 ENCOUNTER — Ambulatory Visit (INDEPENDENT_AMBULATORY_CARE_PROVIDER_SITE_OTHER): Payer: 59

## 2012-06-05 DIAGNOSIS — N83202 Unspecified ovarian cyst, left side: Secondary | ICD-10-CM

## 2012-06-05 DIAGNOSIS — N898 Other specified noninflammatory disorders of vagina: Secondary | ICD-10-CM

## 2012-06-05 DIAGNOSIS — N83201 Unspecified ovarian cyst, right side: Secondary | ICD-10-CM

## 2012-06-05 DIAGNOSIS — R300 Dysuria: Secondary | ICD-10-CM

## 2012-06-05 DIAGNOSIS — N83209 Unspecified ovarian cyst, unspecified side: Secondary | ICD-10-CM

## 2012-06-05 DIAGNOSIS — Z113 Encounter for screening for infections with a predominantly sexual mode of transmission: Secondary | ICD-10-CM

## 2012-06-05 LAB — URINALYSIS W MICROSCOPIC + REFLEX CULTURE
Bilirubin Urine: NEGATIVE
Crystals: NONE SEEN
Glucose, UA: NEGATIVE mg/dL
Leukocytes, UA: NEGATIVE
Protein, ur: NEGATIVE mg/dL
Specific Gravity, Urine: >1.03 — ABNORMAL HIGH (ref 1.005–1.030)
Urobilinogen, UA: 0.2 mg/dL (ref 0.0–1.0)

## 2012-06-05 LAB — WET PREP FOR TRICH, YEAST, CLUE
Clue Cells Wet Prep HPF POC: NONE SEEN
Trich, Wet Prep: NONE SEEN

## 2012-06-05 MED ORDER — NITROFURANTOIN MONOHYD MACRO 100 MG PO CAPS: 100.0000 mg | ORAL_CAPSULE | Freq: Two times a day (BID) | ORAL | Status: DC

## 2012-06-05 MED ORDER — FLUCONAZOLE 100 MG PO TABS: ORAL_TABLET | ORAL | Status: DC

## 2012-06-05 NOTE — Patient Instructions (Addendum)
Urinary Tract Infection Urinary tract infections (UTIs) can develop anywhere along your urinary tract. Your urinary tract is your body's drainage system for removing wastes and extra water. Your urinary tract includes two kidneys, two ureters, a bladder, and a urethra. Your kidneys are a pair of bean-shaped organs. Each kidney is about the size of your fist. They are located below your ribs, one on each side of your spine. CAUSES Infections are caused by microbes, which are microscopic organisms, including fungi, viruses, and bacteria. These organisms are so small that they can only be seen through a microscope. Bacteria are the microbes that most commonly cause UTIs. SYMPTOMS  Symptoms of UTIs may vary by age and gender of the patient and by the location of the infection. Symptoms in young women typically include a frequent and intense urge to urinate and a painful, burning feeling in the bladder or urethra during urination. Older women and men are more likely to be tired, shaky, and weak and have muscle aches and abdominal pain. A fever may mean the infection is in your kidneys. Other symptoms of a kidney infection include pain in your back or sides below the ribs, nausea, and vomiting. DIAGNOSIS To diagnose a UTI, your caregiver will ask you about your symptoms. Your caregiver also will ask to provide a urine sample. The urine sample will be tested for bacteria and white blood cells. White blood cells are made by your body to help fight infection. TREATMENT  Typically, UTIs can be treated with medication. Because most UTIs are caused by a bacterial infection, they usually can be treated with the use of antibiotics. The choice of antibiotic and length of treatment depend on your symptoms and the type of bacteria causing your infection. HOME CARE INSTRUCTIONS  If you were prescribed antibiotics, take them exactly as your caregiver instructs you. Finish the medication even if you feel better after you  have only taken some of the medication.  Drink enough water and fluids to keep your urine clear or pale yellow.  Avoid caffeine, tea, and carbonated beverages. They tend to irritate your bladder.  Empty your bladder often. Avoid holding urine for long periods of time.  Empty your bladder before and after sexual intercourse.  After a bowel movement, women should cleanse from front to back. Use each tissue only once. SEEK MEDICAL CARE IF:   You have back pain.  You develop a fever.  Your symptoms do not begin to resolve within 3 days. SEEK IMMEDIATE MEDICAL CARE IF:   You have severe back pain or lower abdominal pain.  You develop chills.  You have nausea or vomiting.  You have continued burning or discomfort with urination. MAKE SURE YOU:   Understand these instructions.  Will watch your condition.  Will get help right away if you are not doing well or get worse. Document Released: 02/06/2005 Document Revised: 10/29/2011 Document Reviewed: 06/07/2011 [facility] Patient Information 2013 [place], [place].  Monilial Vaginitis Vaginitis in a soreness, swelling and redness (inflammation) of the vagina and vulva. Monilial vaginitis is not a sexually transmitted infection. CAUSES  Yeast vaginitis is caused by yeast (candida) that is normally found in your vagina. With a yeast infection, the candida has overgrown in number to a point that upsets the chemical balance. SYMPTOMS   White, thick vaginal discharge.  Swelling, itching, redness and irritation of the vagina and possibly the lips of the vagina (vulva).  Burning or painful urination.  Painful intercourse. DIAGNOSIS  Things that may contribute  to monilial vaginitis are:  Postmenopausal and virginal states.  Pregnancy.  Infections.  Being tired, sick or stressed, especially if you had monilial vaginitis in the past.  Diabetes. Good control will help lower the chance.  Birth control pills.  Tight fitting  garments.  Using bubble bath, feminine sprays, douches or deodorant tampons.  Taking certain medications that kill germs (antibiotics).  Sporadic recurrence can occur if you become ill. TREATMENT  Your caregiver will give you medication.  There are several kinds of anti monilial vaginal creams and suppositories specific for monilial vaginitis. For recurrent yeast infections, use a suppository or cream in the vagina 2 times a week, or as directed.  Anti-monilial or steroid cream for the itching or irritation of the vulva may also be used. Get your caregiver's permission.  Painting the vagina with methylene blue solution may help if the monilial cream does not work.  Eating yogurt may help prevent monilial vaginitis. HOME CARE INSTRUCTIONS   Finish all medication as prescribed.  Do not have sex until treatment is completed or after your caregiver tells you it is okay.  Take warm sitz baths.  Do not douche.  Do not use tampons, especially scented ones.  Wear cotton underwear.  Avoid tight pants and panty hose.  Tell your sexual partner that you have a yeast infection. They should go to their caregiver if they have symptoms such as mild rash or itching.  Your sexual partner should be treated as well if your infection is difficult to eliminate.  Practice safer sex. Use condoms.  Some vaginal medications cause latex condoms to fail. Vaginal medications that harm condoms are:  Cleocin cream.  Butoconazole (Femstat).  Terconazole (Terazol) vaginal suppository.  Miconazole (Monistat) (may be purchased over the counter). SEEK MEDICAL CARE IF:   You have a temperature by mouth above 102 F (38.9 C).  The infection is getting worse after 2 days of treatment.  The infection is not getting better after 3 days of treatment.  You develop blisters in or around your vagina.  You develop vaginal bleeding, and it is not your menstrual period.  You have pain when you  urinate.  You develop intestinal problems.  You have pain with sexual intercourse. Document Released: 02/06/2005 Document Revised: 07/22/2011 Document Reviewed: 10/21/2008 [facility] Patient Information 2013 [place], [place].

## 2012-06-06 LAB — GC/CHLAMYDIA PROBE AMP: GC Probe RNA: NEGATIVE

## 2012-06-06 NOTE — Progress Notes (Addendum)
Erika Armstrong is an 22 y.o. female. Who presented to the office for preoperative consultation as a result of her persistent left ovarian cyst. Patient was also having slight vaginal discharge today and an STD screen along with a wet prep was undertaken. Patient hasn't nexplanon for contraception which was placed in 2012  and is not having menses. Patient has the Gardasil Vaccine series completed.  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 On the office visit December 2013 she had an ultrasound due to the fact that time of a pelvic exam a fullness was noted on her left adnexa. The ultrasound demonstrated the following:  Uterus measures 7 x 4.3 x 2.2 cm with endometrial stripe 1.5 mm. Anteverted uterus displaced to the right. Fluid-filled endometrial cavity. Right ovary normal follicles less than 12 mm. Left ovarian rim of tissue seen with follicles. Left adnexa with a solid cystic mass echogenic avascular measuring 4.9 x 4.1 x 4.6 cm some left ovarian arterial blood flow was seen. Mass was otherwise avascular. She had a normal CA 125 (9.9). She was asked to return back to the office for followup ultrasound which she did today 06/05/2012 with the following findings:  The uterus measured 5.6 x 4.1 x 2.5 cm with endometrial stripe of 10.5 mm. Right ovary appeared to be normal. Left adnexal hyperechoic, avascular mass measured 4.4 x 3.8 x 4.4 cm was noted there was no free fluid noted in the cul-de-sac.  Patient's wet prep today demonstrated moniliasis and she was given a prescription Diflucan 150 mg to take 1 by mouth.   Pertinent Gynecological History: Menses: No menses on nexplanon Bleeding: No menses on nexplanon Contraception: none DES exposure: denies Blood transfusions: none Sexually transmitted diseases: no past history Previous GYN Procedures: Laparoscopic right ovarian cystectomy for dermoid cyst    Last mammogram: Not indicated Date: Not indicated Last pap: No prior Pap Date: No prior Pap OB History: G 0, P 0   Menstrual History: Menarche age: 47 No LMP recorded. Patient has had an implant.    Past Medical History  Diagnosis Date  . Thyroid disease     Hypothyroid  . Endometriosis   . Ovarian cyst   . Depression   . Asthma   . Allergy   . Arthritis     Past Surgical History  Procedure Date  . Dermoid cyst  excision     Family History  Problem Relation Age of Onset  . Heart disease Mother   . Hypertension Mother   . Hyperlipidemia Father   . Diabetes Other   . Hypertension Other   . Hypertension Maternal Grandmother   . COPD Maternal Grandmother   . COPD Maternal Grandfather   . Hypertension Paternal Grandmother   . Diabetes Paternal Grandmother   . COPD Paternal Grandfather     Social History:  reports that she has never smoked. She has never used smokeless tobacco. She reports that she does not drink alcohol or use illicit drugs.  Allergies:  Allergies  Allergen Reactions  . Seroquel (Quetiapine Fumerate) Swelling     (Not in a hospital admission)  REVIEW OF SYSTEMS: A ROS was performed and pertinent positives and negatives are included in the history.  GENERAL: No fevers or chills. HEENT: No change in vision, no earache, sore throat or sinus congestion. NECK: No pain or stiffness. CARDIOVASCULAR: No chest pain or pressure. No palpitations. PULMONARY: No shortness of  breath, cough or wheeze. GASTROINTESTINAL: No abdominal pain, nausea, vomiting or diarrhea, melena or bright red blood per rectum. GENITOURINARY: No urinary frequency, urgency, hesitancy or dysuria. MUSCULOSKELETAL: No joint or muscle pain, no back pain, no recent trauma. DERMATOLOGIC: No rash, no itching, no lesions. ENDOCRINE: No polyuria, polydipsia, no heat or cold intolerance. No recent change in weight. HEMATOLOGICAL: No anemia or easy bruising or bleeding. NEUROLOGIC: No headache,  seizures, numbness, tingling or weakness. PSYCHIATRIC: No depression, no loss of interest in normal activity or change in sleep pattern.     There were no vitals taken for this visit.  Physical Exam:  HEENT:unremarkable Neck:Supple, midline, no thyroid megaly, no carotid bruits Lungs:  Clear to auscultation no rhonchi's or wheezes Heart:Regular rate and rhythm, no murmurs or gallops Breast Exam: Done recently reported to be normal Abdomen: Soft nontender no rebound or guarding Pelvic:BUS within normal limits Vagina: Moniliasis Cervix: No lesions Uterus: Anteverted Adnexa: Left adnexal fullness Extremities: No cords, no edema Rectal: Not done  Results for orders placed in visit on 06/05/12 (from the past 24 hour(s))  URINALYSIS WITH CULTURE REFLEX     Status: Abnormal   Collection Time   06/05/12  4:33 PM      Component Value Range   Color, Urine YELLOW  YELLOW   APPearance CLEAR  CLEAR   Specific Gravity, Urine >1.030 (*) 1.005 - 1.030   pH 5.5  5.0 - 8.0   Glucose, UA NEG  NEG mg/dL   Bilirubin Urine NEG  NEG   Ketones, ur TRACE (*) NEG mg/dL   Hgb urine dipstick TRACE (*) NEG   Protein, ur NEG  NEG mg/dL   Urobilinogen, UA 0.2  0.0 - 1.0 mg/dL   Nitrite NEG  NEG   Leukocytes, UA NEG  NEG   Squamous Epithelial / LPF FEW  RARE   Crystals NONE SEEN  NONE SEEN   Casts NONE SEEN  NONE SEEN   WBC, UA 0-2  <3 WBC/hpf   RBC / HPF 0-2  <3 RBC/hpf   Bacteria, UA FEW (*) RARE   Narrative:    Performed at:  [facility]                [address]                [place], [place] [address]  WET PREP FOR TRICH, YEAST, CLUE     Status: Abnormal   Collection Time   06/05/12  4:59 PM      Component Value Range   Yeast Wet Prep HPF POC FEW (*) NONE SEEN   Trich, Wet Prep NONE SEEN  NONE SEEN   Clue Cells Wet Prep HPF POC NONE SEEN  NONE SEEN   WBC, Wet Prep HPF POC FEW  NONE SEEN   Narrative:    Performed at:  [facility]                [address]                [place], [place] [address]  GC/CHLAMYDIA PROBE AMP     Status: Normal (In process)   Collection Time   06/05/12  4:59 PM   Narrative:    Performed at:  [facility]                [address]  [place], [place] [address]  CA 125     Status: Normal   Collection Time   06/05/12  5:06 PM      Component Value Range   CA 125 9.9  0.0 - 30.2 U/mL   Narrative:    Performed at:  [facility]                [address]                [place], [place] [address]    No results found.  Assessment/Plan: Patient with persistent left ovarian cyst highly suspicious for another dermoid cyst. Patient is having some pulling sensation and discomfort during intercourse. Patient with recent normal CA 125. Wet prep today demonstrated moniliasis and she was given a prescription of Diflucan 150 mg one by mouth. Urine culture pending at this time. Her urinalysis had demonstrated 0-2 white blood cells, 0-RBC, and few bacteria. Patient was to start the Macrobid if she began having any dysuria or back pain 100 mg twice a day for 7 days. Culture pending at time of this dictation. We discussed laparoscopic left ovarian cystectomy and the risk discuss what her were as follow:                        Patient was counseled as to the risk of surgery to include the following:  1. Infection (prohylactic antibiotics will be administered)  2. DVT/Pulmonary Embolism (prophylactic pneumo compression stockings will be used)  3.Trauma to internal organs requiring additional surgical procedure to repair any injury to     Internal organs requiring perhaps additional hospitalization days.  4.Hemmorhage requiring transfusion and blood products which carry risks such as anaphylactic reaction, hepatitis and AIDS  5. Removal of left ovarian cyst, possible left salpingo-oophorectomy.  Patient had received literature information on the procedure  scheduled and all her questions were answered accepts all risk. STD screen pending at time of this dictation (GC and Chlamydia culture).  [facility] HMD11:37 AMTD@   Akshaj Besancon H 06/06/2012, 11:25 AM

## 2012-06-07 LAB — URINE CULTURE

## 2012-06-11 ENCOUNTER — Telehealth: Payer: Self-pay

## 2012-06-11 NOTE — Telephone Encounter (Signed)
Patient returned my call. I had left message on 06/08/12 for her to call me regarding surgery--ins benefits and scheduling.Patient states that she would like to do surgery after Feb 20th.  I called her back and left message to call me back.

## 2012-06-11 NOTE — Telephone Encounter (Signed)
I called patient and at her request left detailed message on her voice mail.  Patient informed that I followed up with administrator who also spoke with Dr. Glenetta Hew.  The determination is that this surgery is not urgent/emergent and patient could have a little time to prepare financially.  Dr. Glenetta Hew said it was reassuring that the CA-125 was normal and we should recheck an ultrasound in two months if she opts to postpone surgery.  I asked her to call me and let me know what she would like to do and if she wants to postpone we will go ahead and get her scheduled for her u/s.

## 2012-06-11 NOTE — Telephone Encounter (Signed)
I spoke with patient regarding ins benefits and scheduling surgery.  Patient said she cannot afford surgery.  She can only pay a small fraction of her surgery prepymt to [facility].  I will check with administrator/Dr. JF and see what they recommend. I told patient I will call her back.

## 2012-06-18 ENCOUNTER — Encounter (HOSPITAL_COMMUNITY): Admission: RE | Payer: Self-pay | Source: Ambulatory Visit

## 2012-06-18 ENCOUNTER — Ambulatory Visit (HOSPITAL_COMMUNITY): Admission: RE | Admit: 2012-06-18 | Payer: 59 | Source: Ambulatory Visit | Admitting: Gynecology

## 2012-06-18 SURGERY — LAPAROSCOPY OPERATIVE
Anesthesia: General | Laterality: Left

## 2012-07-07 ENCOUNTER — Telehealth: Payer: Self-pay

## 2012-07-07 ENCOUNTER — Other Ambulatory Visit: Payer: Self-pay | Admitting: Gynecology

## 2012-07-07 NOTE — Telephone Encounter (Signed)
I called patient to follow-up with her on a couple of things.  #1 She was not able to schedule surgery due to her financial issues and Dr. Glenetta Hew recommended an u/s 2 mos follow-up if she opted to postpone it.  That will be due the end of March and I wanted to see if we could go ahead and schedule that for her.  #2 We are holding a Mirena IUD for her since December and I would like to know if patient plans to use it or not.  I asked her to call me when she can.

## 2012-07-21 ENCOUNTER — Telehealth: Payer: Self-pay

## 2012-07-21 NOTE — Telephone Encounter (Signed)
Patient had returned my 07/08/12 call and we talked. She does not want the Mirena IUD we are holding.  She was willing to go ahead and schedule u/s.  I had to leave message for appt desk to call her to schedule.

## 2012-07-22 NOTE — Telephone Encounter (Signed)
Appt for u/s and office visit scheduled for 08/14/12.

## 2012-08-14 ENCOUNTER — Ambulatory Visit (INDEPENDENT_AMBULATORY_CARE_PROVIDER_SITE_OTHER): Payer: 59

## 2012-08-14 ENCOUNTER — Ambulatory Visit (INDEPENDENT_AMBULATORY_CARE_PROVIDER_SITE_OTHER): Payer: 59 | Admitting: Gynecology

## 2012-08-14 DIAGNOSIS — N83209 Unspecified ovarian cyst, unspecified side: Secondary | ICD-10-CM

## 2012-08-14 DIAGNOSIS — N83202 Unspecified ovarian cyst, left side: Secondary | ICD-10-CM

## 2012-08-14 NOTE — Progress Notes (Signed)
Patient was seen in the office on 06/11/2012 for preoperative consultation As a result of her persistent left ovarian cyst. 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 On the office visit December 2013 she had an ultrasound due to the fact that time of a pelvic exam a fullness was noted on her left adnexa. The ultrasound demonstrated the following:  Uterus measures 7 x 4.3 x 2.2 cm with endometrial stripe 1.5 mm. Anteverted uterus displaced to the right. Fluid-filled endometrial cavity. Right ovary normal follicles less than 12 mm. Left ovarian rim of tissue seen with follicles. Left adnexa with a solid cystic mass echogenic avascular measuring 4.9 x 4.1 x 4.6 cm some left ovarian arterial blood flow was seen. Mass was otherwise avascular. She had a normal CA 125 (9.9). She was asked to return back to the office for followup ultrasound which she did today 06/05/2012 with the following findings:  The uterus measured 5.6 x 4.1 x 2.5 cm with endometrial stripe of 10.5 mm. Right ovary appeared to be normal. Left adnexal hyperechoic, avascular mass measured 4.4 x 3.8 x 4.4 cm was noted there was no free fluid noted in the cul-de-sac.   Patient was scheduled for surgery on February 6 but due to financial reasons the case was canceled and she returned today for followup ultrasound and further discussion. Today's ultrasound demonstrated the following  Uterus measured 5.6 x 3.9 x 2.4 cm with endometrial stripe of 4.4 mm. Right ovary was normal left adnexal hyperechoic avascular mass measuring 5.6 x 4.0 x 5.3 cm. No free fluid in the cul-de-sac.  Patient would like to try to reschedule the operation and see if the [place] system will allow her to make some form of payment  plan for her surgery. Patient is not in any distress today. We'll contact her later next  week to reschedule.

## 2012-08-14 NOTE — Patient Instructions (Addendum)
Diagnostic Laparoscopy Laparoscopy is a surgical procedure. It is used to diagnose and treat diseases inside the belly(abdomen). It is usually a brief, common, and relatively simple procedure. The laparoscopeis a thin, lighted, pencil-sized instrument. It is like a telescope. It is inserted into your abdomen through a small cut (incision). Your caregiver can look at the organs inside your body through this instrument. He or she can see if there is anything abnormal. Laparoscopy can be done either in a hospital or outpatient clinic. You may be given a mild sedative to help you relax before the procedure. Once in the operating room, you will be given a drug to make you sleep (general anesthesia). Laparoscopy usually lasts less than 1 hour. After the procedure, you will be monitored in a recovery area until you are stable and doing well. Once you are home, it will take 2 to 3 days to fully recover. RISKS AND COMPLICATIONS  Laparoscopy has relatively few risks. Your caregiver will discuss the risks with you before the procedure. Some problems that can occur include:  Infection.  Bleeding.  Damage to other organs.  Anesthetic side effects. PROCEDURE Once you receive anesthesia, your surgeon inflates the abdomen with a harmless gas (carbon dioxide). This makes the organs easier to see. The laparoscope is inserted into the abdomen through a small incision. This allows your surgeon to see into the abdomen. Other small instruments are also inserted into the abdomen through other small openings. Many surgeons attach a video camera to the laparoscope to enlarge the view. During a diagnostic laparoscopy, the surgeon may be looking for inflammation, infection, or cancer. Your surgeon may take tissue samples(biopsies). The samples are sent to a specialist in looking at cells and tissue samples (pathologist). The pathologist examines them under a microscope. Biopsies can help to diagnose or confirm a  disease. AFTER THE PROCEDURE   The gas is released from inside the abdomen.  The incisions are closed with stitches (sutures). Because these incisions are small (usually less than 1/2 inch), there is usually minimal discomfort after the procedure. There may be some mild discomfort in the throat. This is from the tube placed in the throat while you were sleeping. You may have some mild abdominal discomfort. There may also be discomfort from the instrument placement incisions in the abdomen.  The recovery time is shortened as long as there are no complications.  You will rest in a recovery room until stable and doing well. As long as there are no complications, you may be allowed to go home. FINDING OUT THE RESULTS OF YOUR TEST Not all test results are available during your visit. If your test results are not back during the visit, make an appointment with your caregiver to find out the results. Do not assume everything is normal if you have not heard from your caregiver or the medical facility. It is important for you to follow up on all of your test results. HOME CARE INSTRUCTIONS   Take all medicines as directed.  Only take over-the-counter or prescription medicines for pain, discomfort, or fever as directed by your caregiver.  Resume daily activities as directed.  Showers are preferred over baths.  You may resume sexual activities in 1 week or as directed.  Do not drive while taking narcotics. SEEK MEDICAL CARE IF:   There is increasing abdominal pain.  There is new pain in the shoulders (shoulder strap areas).  You feel lightheaded or faint.  You have the chills.  You or your  child has an oral temperature above 102 F (38.9 C).  There is pus-like (purulent) drainage from any of the wounds.  You are unable to pass gas or have a bowel movement.  You feel sick to your stomach (nauseous) or throw up (vomit). MAKE SURE YOU:   Understand these instructions.  Will watch  your condition.  Will get help right away if you are not doing well or get worse. Document Released: 08/05/2000 Document Revised: 07/22/2011 Document Reviewed: 04/29/2007 [facility] Patient Information 2013 [place], [place]. Ovarian Cyst The ovaries are small organs that are on each side of the uterus. The ovaries are the organs that produce the female hormones, estrogen and progesterone. An ovarian cyst is a sac filled with fluid that can vary in its size. It is normal for a small cyst to form in women who are in the childbearing age and who have menstrual periods. This type of cyst is called a follicle cyst that becomes an ovulation cyst (corpus luteum cyst) after it produces the women's egg. It later goes away on its own if the woman does not become pregnant. There are other kinds of ovarian cysts that may cause problems and may need to be treated. The most serious problem is a cyst with cancer. It should be noted that menopausal women who have an ovarian cyst are at a higher risk of it being a cancer cyst. They should be evaluated very quickly, thoroughly and followed closely. This is especially true in menopausal women because of the high rate of ovarian cancer in women in menopause. CAUSES AND TYPES OF OVARIAN CYSTS:  FUNCTIONAL CYST: The follicle/corpus luteum cyst is a functional cyst that occurs every month during ovulation with the menstrual cycle. They go away with the next menstrual cycle if the woman does not get pregnant. Usually, there are no symptoms with a functional cyst.  ENDOMETRIOMA CYST: This cyst develops from the lining of the uterus tissue. This cyst gets in or on the ovary. It grows every month from the bleeding during the menstrual period. It is also called a "chocolate cyst" because it becomes filled with blood that turns brown. This cyst can cause pain in the lower abdomen during intercourse and with your menstrual period.  CYSTADENOMA CYST: This cyst develops from the cells on  the outside of the ovary. They usually are not cancerous. They can get very big and cause lower abdomen pain and pain with intercourse. This type of cyst can twist on itself, cut off its blood supply and cause severe pain. It also can easily rupture and cause a lot of pain.  DERMOID CYST: This type of cyst is sometimes found in both ovaries. They are found to have different kinds of body tissue in the cyst. The tissue includes skin, teeth, hair, and/or cartilage. They usually do not have symptoms unless they get very big. Dermoid cysts are rarely cancerous.  POLYCYSTIC OVARY: This is a rare condition with hormone problems that produces many small cysts on both ovaries. The cysts are follicle-like cysts that never produce an egg and become a corpus luteum. It can cause an increase in body weight, infertility, acne, increase in body and facial hair and lack of menstrual periods or rare menstrual periods. Many women with this problem develop type 2 diabetes. The exact cause of this problem is unknown. A polycystic ovary is rarely cancerous.  THECA LUTEIN CYST: Occurs when too much hormone (human chorionic gonadotropin) is produced and over-stimulates the ovaries to produce an  egg. They are frequently seen when doctors stimulate the ovaries for invitro-fertilization (test tube babies).  LUTEOMA CYST: This cyst is seen during pregnancy. Rarely it can cause an obstruction to the birth canal during labor and delivery. They usually go away after delivery. SYMPTOMS   Pelvic pain or pressure.  Pain during sexual intercourse.  Increasing girth (swelling) of the abdomen.  Abnormal menstrual periods.  Increasing pain with menstrual periods.  You stop having menstrual periods and you are not pregnant. DIAGNOSIS  The diagnosis can be made during:  Routine or annual pelvic examination (common).  Ultrasound.  X-ray of the pelvis.  CT Scan.  MRI.  Blood tests. TREATMENT   Treatment may only be to  follow the cyst monthly for 2 to 3 months with your caregiver. Many go away on their own, especially functional cysts.  May be aspirated (drained) with a long needle with ultrasound, or by laparoscopy (inserting a tube into the pelvis through a small incision).  The whole cyst can be removed by laparoscopy.  Sometimes the cyst may need to be removed through an incision in the lower abdomen.  Hormone treatment is sometimes used to help dissolve certain cysts.  Birth control pills are sometimes used to help dissolve certain cysts. HOME CARE INSTRUCTIONS  Follow your caregiver's advice regarding:  Medicine.  Follow up visits to evaluate and treat the cyst.  You may need to come back or make an appointment with another caregiver, to find the exact cause of your cyst, if your caregiver is not a gynecologist.  Get your yearly and recommended pelvic examinations and Pap tests.  Let your caregiver know if you have had an ovarian cyst in the past. SEEK MEDICAL CARE IF:   Your periods are late, irregular, they stop, or are painful.  Your stomach (abdomen) or pelvic pain does not go away.  Your stomach becomes larger or swollen.  You have pressure on your bladder or trouble emptying your bladder completely.  You have painful sexual intercourse.  You have feelings of fullness, pressure, or discomfort in your stomach.  You lose weight for no apparent reason.  You feel generally ill.  You become constipated.  You lose your appetite.  You develop acne.  You have an increase in body and facial hair.  You are gaining weight, without changing your exercise and eating habits.  You think you are pregnant. SEEK IMMEDIATE MEDICAL CARE IF:   You have increasing abdominal pain.  You feel sick to your stomach (nausea) and/or vomit.  You develop a fever that comes on suddenly.  You develop abdominal pain during a bowel movement.  Your menstrual periods become heavier than  usual. Document Released: 04/29/2005 Document Revised: 07/22/2011 Document Reviewed: 03/02/2009 [facility] Patient Information 2013 [place], [place].

## 2012-08-25 ENCOUNTER — Telehealth: Payer: Self-pay

## 2012-08-25 NOTE — Telephone Encounter (Signed)
I called patient to let her know I spoke with Franciso [place]. And she said that we can work with her regarding payments for surgery.  Patient was very happy and is going to check regarding someone to drive her home from surgery and be with her for 24 hours and also to check with her work.  She will call me when she works these things out to schedule.

## 2012-09-09 ENCOUNTER — Telehealth: Payer: Self-pay

## 2012-09-09 NOTE — Telephone Encounter (Signed)
Patient called and left message in voice mail stating she is ready to schedule surgery.

## 2012-09-11 NOTE — Telephone Encounter (Signed)
Surgery scheduled in coordination with patient's schedule. Instructions provided.

## 2012-09-24 ENCOUNTER — Ambulatory Visit (INDEPENDENT_AMBULATORY_CARE_PROVIDER_SITE_OTHER): Payer: 59 | Admitting: Gynecology

## 2012-09-24 ENCOUNTER — Encounter: Payer: Self-pay | Admitting: Gynecology

## 2012-09-24 VITALS — BP 128/86

## 2012-09-24 DIAGNOSIS — Z309 Encounter for contraceptive management, unspecified: Secondary | ICD-10-CM

## 2012-09-24 DIAGNOSIS — N83209 Unspecified ovarian cyst, unspecified side: Secondary | ICD-10-CM

## 2012-09-24 DIAGNOSIS — L989 Disorder of the skin and subcutaneous tissue, unspecified: Secondary | ICD-10-CM

## 2012-09-24 DIAGNOSIS — N83202 Unspecified ovarian cyst, left side: Secondary | ICD-10-CM

## 2012-09-24 DIAGNOSIS — R238 Other skin changes: Secondary | ICD-10-CM

## 2012-09-24 NOTE — Progress Notes (Signed)
Patient presented to the office today to discuss the fact that she was concerned about her left upper arm were 8 nexplanon was placed 2 years ago and a patient at work graft her arm and it was somewhat sore and she wanted to be checked. She states that when they placed the nexplanon that they placed to 2 superficial and she whelps at the site periodically. Patient is scheduled to undergo laparoscopic left ovarian cystectomy at the end of this month.  Exam:  left upper arm medial aspect nexplanon capsule was palpated superficially underneath the skin. It appears to be intact. Slight irritation at on of the ends. Otherwise no abnormality.  Assessment/plan: Patient would like to have this nexplanon removed and explained to her that we can do it in the operating room at time of her left ovarian cystectomy. When she returned back to the office for postop visit 2 weeks later we can then place a new nexplanon on the other arm. She was instructed to put Neosporin in to the area twice a day for the next 5-7 days. We had talked about different other contraceptive options but this has worked up as for her for controlling her cycle.

## 2012-09-25 ENCOUNTER — Telehealth: Payer: Self-pay | Admitting: Gynecology

## 2012-09-25 NOTE — Telephone Encounter (Signed)
09/25/12-Pt was advised that her [facility] insurance covers the Nexplanon & insertion at 100% under preventative services when used for contraception. She wants to proceed.WL

## 2012-09-28 ENCOUNTER — Telehealth: Payer: Self-pay | Admitting: *Deleted

## 2012-09-28 NOTE — Telephone Encounter (Signed)
Left message for pt to call.

## 2012-09-28 NOTE — Telephone Encounter (Signed)
Pt is scheduled for surgery on 10/08/12 left ovarian cystectomy and her nexplanon removed in operating room. She was told that postop visit 2 weeks later we can then place a new nexplanon on the other arm. Pt asked if there was a reason why she has to wait the 2 weeks to have new one placed. Please advise

## 2012-09-28 NOTE — Telephone Encounter (Signed)
Please check with Erika Armstrong to check if her insurance company we'll cover to have it removed and put a new one  In  at the time of her left ovarian cystectomy. If so we need to add  It to the operative permit  and I need to be reminded to bring the nexplanon with me to the operating room because they do not have that there

## 2012-09-29 ENCOUNTER — Other Ambulatory Visit: Payer: Self-pay | Admitting: Gynecology

## 2012-09-29 DIAGNOSIS — Z30431 Encounter for routine checking of intrauterine contraceptive device: Secondary | ICD-10-CM

## 2012-10-01 ENCOUNTER — Encounter (HOSPITAL_COMMUNITY): Payer: Self-pay

## 2012-10-02 ENCOUNTER — Encounter (HOSPITAL_COMMUNITY): Payer: Self-pay

## 2012-10-02 ENCOUNTER — Encounter: Payer: Self-pay | Admitting: Gynecology

## 2012-10-02 ENCOUNTER — Encounter (HOSPITAL_COMMUNITY)
Admission: RE | Admit: 2012-10-02 | Discharge: 2012-10-02 | Disposition: A | Discharge status: Home / Self Care | Payer: 59 | Source: Ambulatory Visit | Attending: Gynecology | Admitting: Gynecology

## 2012-10-02 ENCOUNTER — Ambulatory Visit (INDEPENDENT_AMBULATORY_CARE_PROVIDER_SITE_OTHER): Payer: 59 | Admitting: Gynecology

## 2012-10-02 VITALS — BP 114/72

## 2012-10-02 DIAGNOSIS — N83209 Unspecified ovarian cyst, unspecified side: Secondary | ICD-10-CM

## 2012-10-02 DIAGNOSIS — N83202 Unspecified ovarian cyst, left side: Secondary | ICD-10-CM

## 2012-10-02 HISTORY — DX: Other seasonal allergic rhinitis: J30.2

## 2012-10-02 HISTORY — DX: Anxiety disorder, unspecified: F41.9

## 2012-10-02 HISTORY — DX: Bronchitis, not specified as acute or chronic: J40

## 2012-10-02 HISTORY — DX: Headache: R51

## 2012-10-02 LAB — CBC
HCT: 42.1 % (ref 36.0–46.0)
Hemoglobin: 14.7 g/dL (ref 12.0–15.0)
MCH: 31.7 pg (ref 26.0–34.0)
MCHC: 34.9 g/dL (ref 30.0–36.0)
MCV: 90.9 fL (ref 78.0–100.0)

## 2012-10-02 LAB — URINALYSIS, ROUTINE W REFLEX MICROSCOPIC
Bilirubin Urine: NEGATIVE
Glucose, UA: NEGATIVE mg/dL
Ketones, ur: NEGATIVE mg/dL
Protein, ur: NEGATIVE mg/dL

## 2012-10-02 LAB — URINE MICROSCOPIC-ADD ON

## 2012-10-02 MED ORDER — METOCLOPRAMIDE HCL 10 MG PO TABS: 10.0000 mg | ORAL_TABLET | Freq: Three times a day (TID) | ORAL | Status: DC

## 2012-10-02 MED ORDER — HYDROCODONE-ACETAMINOPHEN 5-325 MG PO TABS: 1.0000 | ORAL_TABLET | Freq: Four times a day (QID) | ORAL | Status: DC | PRN

## 2012-10-02 NOTE — Progress Notes (Signed)
Erika Armstrong is an 22 y.o. female. GOPO, Who presented to the office for preoperative consultation as a result of her persistent left ovarian cyst. Patient hasn't nexplanon for contraception which was placed in 2012 and is not having menses. Patient wants to have it removed because it is bothering her and it was placed elsewhere too superficial. She want to have a new one placed at a different location. 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 On the office visit December 2013 she had an ultrasound due to the fact that time of a pelvic exam a fullness was noted on her left adnexa. The ultrasound demonstrated the following:   Uterus measures 7 x 4.3 x 2.2 cm with endometrial stripe 1.5 mm. Anteverted uterus displaced to the right. Fluid-filled endometrial cavity. Right ovary normal follicles less than 12 mm. Left ovarian rim of tissue seen with follicles. Left adnexa with a solid cystic mass echogenic avascular measuring 4.9 x 4.1 x 4.6 cm some left ovarian arterial blood flow was seen. Mass was otherwise avascular. She had a normal CA 125 (9.9). She was asked to return back to the office for followup ultrasound which she did today 06/05/2012 with the following findings:   The uterus measured 5.6 x 4.1 x 2.5 cm with endometrial stripe of 10.5 mm. Right ovary appeared to be normal. Left adnexal hyperechoic, avascular mass measured 4.4 x 3.8 x 4.4 cm was noted there was no free fluid noted in the cul-de-sac.   On April 4th ultrasound repeated because patient was scheduled for surgery on February 6 but due to financial reasons the case was canceled and she returned today for followup ultrasound and further discussion. Today's ultrasound demonstrated the following  Uterus measured 5.6 x 3.9 x 2.4 cm with endometrial stripe of 4.4 mm. Right ovary was normal left adnexal hyperechoic avascular mass measuring 5.6 x 4.0  x 5.3 cm. No free fluid in the cul-de-sac.    Pertinent Gynecological History: Menses: no cycles on Nexplanon Bleeding: none Contraception: Nexplanon DES exposure: denies Blood transfusions: none Sexually transmitted diseases: no past history Previous GYN Procedures: See above  Last mammogram: not indicated Date: not indicated Last pap: normal Date: 2013 OB History: G0, P0   Menstrual History: Menarche age: 4  No LMP recorded. Patient has had an implant.    Past Medical History  Diagnosis Date  . Thyroid disease     Hypothyroid - pt states no med - saw 2nd opinion - neg   . Endometriosis   . Ovarian cyst   . Depression   . Allergy   . Asthma     no inhaler - uses nebalizer  . Seasonal allergies   . Bronchitis     history - per pt  . Headache     otc med prn  . Arthritis     wrist, knees - otc med prn  . Anxiety     and panic attacks    Past Surgical History  Procedure Laterality Date  . Dermoid cyst  excision      right side  . Diagnostic laparoscopy      Family History  Problem Relation Age of Onset  . Heart disease Mother   . Hypertension Mother   . Hyperlipidemia Father   . Diabetes Other   . Hypertension Other   . Hypertension Maternal Grandmother   . COPD Maternal Grandmother   . COPD  Maternal Grandfather   . Hypertension Paternal Grandmother   . Diabetes Paternal Grandmother   . COPD Paternal Grandfather     Social History:  reports that she has never smoked. She has never used smokeless tobacco. She reports that she does not drink alcohol or use illicit drugs.  Allergies:  Allergies  Allergen Reactions  . Seroquel (Quetiapine Fumerate) Swelling     (Not in a hospital admission)  REVIEW OF SYSTEMS: A ROS was performed and pertinent positives and negatives are included in the history.  GENERAL: No fevers or chills. HEENT: No change in vision, no earache, sore throat or sinus congestion. NECK: No pain or stiffness. CARDIOVASCULAR: No  chest pain or pressure. No palpitations. PULMONARY: No shortness of breath, cough or wheeze. GASTROINTESTINAL: No abdominal pain, nausea, vomiting or diarrhea, melena or bright red blood per rectum. GENITOURINARY: No urinary frequency, urgency, hesitancy or dysuria. MUSCULOSKELETAL: No joint or muscle pain, no back pain, no recent trauma. DERMATOLOGIC: No rash, no itching, no lesions. ENDOCRINE: No polyuria, polydipsia, no heat or cold intolerance. No recent change in weight. HEMATOLOGICAL: No anemia or easy bruising or bleeding. NEUROLOGIC: No headache, seizures, numbness, tingling or weakness. PSYCHIATRIC: No depression, no loss of interest in normal activity or change in sleep pattern.     Blood pressure 114/72.  Physical Exam:  HEENT:unremarkable Neck:Supple, midline, no thyroid megaly, no carotid bruits Lungs:  Clear to auscultation no rhonchi's or wheezes Heart:Regular rate and rhythm, no murmurs or gallops Breast Exam:WNL Abdomen:soft nontender no rebound or guarding Pelvic:BUSwithin normal limits Vagina:no lesions or discharge Cervix:no lesions or discharge Uterus:anteverted Adnexa:fullness and tenderness left adnexa Extremities: No cords, no edema Rectal:unremarkable  Results for orders placed during the hospital encounter of 10/02/12 (from the past 24 hour(s))  URINALYSIS, ROUTINE W REFLEX MICROSCOPIC     Status: Abnormal   Collection Time    10/02/12  2:25 PM      Result Value Range   Color, Urine YELLOW  YELLOW   APPearance CLEAR  CLEAR   Specific Gravity, Urine 1.020  1.005 - 1.030   pH 6.0  5.0 - 8.0   Glucose, UA NEGATIVE  NEGATIVE mg/dL   Hgb urine dipstick SMALL (*) NEGATIVE   Bilirubin Urine NEGATIVE  NEGATIVE   Ketones, ur NEGATIVE  NEGATIVE mg/dL   Protein, ur NEGATIVE  NEGATIVE mg/dL   Urobilinogen, UA 0.2  0.0 - 1.0 mg/dL   Nitrite NEGATIVE  NEGATIVE   Leukocytes, UA NEGATIVE  NEGATIVE  URINE MICROSCOPIC-ADD ON     Status: Abnormal   Collection Time     10/02/12  2:25 PM      Result Value Range   Squamous Epithelial / LPF FEW (*) RARE   WBC, UA 0-2  <3 WBC/hpf   RBC / HPF 0-2  <3 RBC/hpf  CBC     Status: None   Collection Time    10/02/12  2:37 PM      Result Value Range   WBC 8.9  4.0 - 10.5 K/uL   RBC 4.63  3.87 - 5.11 MIL/uL   Hemoglobin 14.7  12.0 - 15.0 g/dL   HCT 11.9  14.7 - 82.9 %   MCV 90.9  78.0 - 100.0 fL   MCH 31.7  26.0 - 34.0 pg   MCHC 34.9  30.0 - 36.0 g/dL   RDW 56.2  13.0 - 86.5 %   Platelets 218  150 - 400 K/uL    No results found.  Assessment/Plan: Patient with  persistent left adnexal mass. Last year in [facility] patient had a right ovarian cystectomy for dermoid cyst . Patient also wants to have her  Nexplanon removed from her left arm that was placed at another facility 2 years ago and it has bothered her and feels too superficial. Patient will be scheduled to undergo laparoscopic left ovarian cystectomy possible left oophorectomy possible left salpingo-oophorectomy. Patient will also undergo removal of Nexplanon from the left arm and replaced with a new one on the right. The risks from procedure described below:                        Patient was counseled as to the risk of surgery to include the following:  1. Infection (prohylactic antibiotics will be administered)  2. DVT/Pulmonary Embolism (prophylactic pneumo compression stockings will be used)  3.Trauma to internal organs requiring additional surgical procedure to repair any injury to     Internal organs requiring perhaps additional hospitalization days.  4.Hemmorhage requiring transfusion and blood products which carry risks such as   anaphylactic reaction, hepatitis and AIDS  5. The risk of nerve or vessel injury while removing and reinserting new Nexplanon rod.  Patient had received literature information on the procedure scheduled and all her questions were answered and fully accepts all risk. Patient was given a prescription for  Lortab 5/325 to take one by mouth every 4-6 hours when necessary postop as well as Reglan 10 mg 1 by mouth every 4-6 hours when necessary nausea or vomiting.  [facility] HMD3:26 PMTD@  Kiani Wurtzel H 10/02/2012, 3:02 PM

## 2012-10-02 NOTE — Telephone Encounter (Signed)
Pt never returned call pt was today for OV.

## 2012-10-02 NOTE — Patient Instructions (Addendum)
DONT FORGET TO BRING THE NEXPLANON WITH YOU TO THE HOSPITAL THE DAY OF YOUR SURGERY!!!    Diagnostic Laparoscopy Laparoscopy is a surgical procedure. It is used to diagnose and treat diseases inside the belly(abdomen). It is usually a brief, common, and relatively simple procedure. The laparoscopeis a thin, lighted, pencil-sized instrument. It is like a telescope. It is inserted into your abdomen through a small cut (incision). Your caregiver can look at the organs inside your body through this instrument. He or she can see if there is anything abnormal. Laparoscopy can be done either in a hospital or outpatient clinic. You may be given a mild sedative to help you relax before the procedure. Once in the operating room, you will be given a drug to make you sleep (general anesthesia). Laparoscopy usually lasts less than 1 hour. After the procedure, you will be monitored in a recovery area until you are stable and doing well. Once you are home, it will take 2 to 3 days to fully recover. RISKS AND COMPLICATIONS  Laparoscopy has relatively few risks. Your caregiver will discuss the risks with you before the procedure. Some problems that can occur include:  Infection.  Bleeding.  Damage to other organs.  Anesthetic side effects. PROCEDURE Once you receive anesthesia, your surgeon inflates the abdomen with a harmless gas (carbon dioxide). This makes the organs easier to see. The laparoscope is inserted into the abdomen through a small incision. This allows your surgeon to see into the abdomen. Other small instruments are also inserted into the abdomen through other small openings. Many surgeons attach a video camera to the laparoscope to enlarge the view. During a diagnostic laparoscopy, the surgeon may be looking for inflammation, infection, or cancer. Your surgeon may take tissue samples(biopsies). The samples are sent to a specialist in looking at cells and tissue samples (pathologist). The  pathologist examines them under a microscope. Biopsies can help to diagnose or confirm a disease. AFTER THE PROCEDURE   The gas is released from inside the abdomen.  The incisions are closed with stitches (sutures). Because these incisions are small (usually less than 1/2 inch), there is usually minimal discomfort after the procedure. There may be some mild discomfort in the throat. This is from the tube placed in the throat while you were sleeping. You may have some mild abdominal discomfort. There may also be discomfort from the instrument placement incisions in the abdomen.  The recovery time is shortened as long as there are no complications.  You will rest in a recovery room until stable and doing well. As long as there are no complications, you may be allowed to go home. FINDING OUT THE RESULTS OF YOUR TEST Not all test results are available during your visit. If your test results are not back during the visit, make an appointment with your caregiver to find out the results. Do not assume everything is normal if you have not heard from your caregiver or the medical facility. It is important for you to follow up on all of your test results. HOME CARE INSTRUCTIONS   Take all medicines as directed.  Only take over-the-counter or prescription medicines for pain, discomfort, or fever as directed by your caregiver.  Resume daily activities as directed.  Showers are preferred over baths.  You may resume sexual activities in 1 week or as directed.  Do not drive while taking narcotics. SEEK MEDICAL CARE IF:   There is increasing abdominal pain.  There is new pain in the  shoulders (shoulder strap areas).  You feel lightheaded or faint.  You have the chills.  You or your child has an oral temperature above 102 F (38.9 C).  There is pus-like (purulent) drainage from any of the wounds.  You are unable to pass gas or have a bowel movement.  You feel sick to your stomach (nauseous)  or throw up (vomit). MAKE SURE YOU:   Understand these instructions.  Will watch your condition.  Will get help right away if you are not doing well or get worse. Document Released: 08/05/2000 Document Revised: 07/22/2011 Document Reviewed: 04/29/2007 [facility] Patient Information 2014 [place], [place].  Ovarian Cyst The ovaries are small organs that are on each side of the uterus. The ovaries are the organs that produce the female hormones, estrogen and progesterone. An ovarian cyst is a sac filled with fluid that can vary in its size. It is normal for a small cyst to form in women who are in the childbearing age and who have menstrual periods. This type of cyst is called a follicle cyst that becomes an ovulation cyst (corpus luteum cyst) after it produces the women's egg. It later goes away on its own if the woman does not become pregnant. There are other kinds of ovarian cysts that may cause problems and may need to be treated. The most serious problem is a cyst with cancer. It should be noted that menopausal women who have an ovarian cyst are at a higher risk of it being a cancer cyst. They should be evaluated very quickly, thoroughly and followed closely. This is especially true in menopausal women because of the high rate of ovarian cancer in women in menopause. CAUSES AND TYPES OF OVARIAN CYSTS:  FUNCTIONAL CYST: The follicle/corpus luteum cyst is a functional cyst that occurs every month during ovulation with the menstrual cycle. They go away with the next menstrual cycle if the woman does not get pregnant. Usually, there are no symptoms with a functional cyst.  ENDOMETRIOMA CYST: This cyst develops from the lining of the uterus tissue. This cyst gets in or on the ovary. It grows every month from the bleeding during the menstrual period. It is also called a "chocolate cyst" because it becomes filled with blood that turns brown. This cyst can cause pain in the lower abdomen during intercourse  and with your menstrual period.  CYSTADENOMA CYST: This cyst develops from the cells on the outside of the ovary. They usually are not cancerous. They can get very big and cause lower abdomen pain and pain with intercourse. This type of cyst can twist on itself, cut off its blood supply and cause severe pain. It also can easily rupture and cause a lot of pain.  DERMOID CYST: This type of cyst is sometimes found in both ovaries. They are found to have different kinds of body tissue in the cyst. The tissue includes skin, teeth, hair, and/or cartilage. They usually do not have symptoms unless they get very big. Dermoid cysts are rarely cancerous.  POLYCYSTIC OVARY: This is a rare condition with hormone problems that produces many small cysts on both ovaries. The cysts are follicle-like cysts that never produce an egg and become a corpus luteum. It can cause an increase in body weight, infertility, acne, increase in body and facial hair and lack of menstrual periods or rare menstrual periods. Many women with this problem develop type 2 diabetes. The exact cause of this problem is unknown. A polycystic ovary is rarely cancerous.  THECA LUTEIN CYST: Occurs when too much hormone (human chorionic gonadotropin) is produced and over-stimulates the ovaries to produce an egg. They are frequently seen when doctors stimulate the ovaries for invitro-fertilization (test tube babies).  LUTEOMA CYST: This cyst is seen during pregnancy. Rarely it can cause an obstruction to the birth canal during labor and delivery. They usually go away after delivery. SYMPTOMS   Pelvic pain or pressure.  Pain during sexual intercourse.  Increasing girth (swelling) of the abdomen.  Abnormal menstrual periods.  Increasing pain with menstrual periods.  You stop having menstrual periods and you are not pregnant. DIAGNOSIS  The diagnosis can be made during:  Routine or annual pelvic examination (common).  Ultrasound.  X-ray  of the pelvis.  CT Scan.  MRI.  Blood tests. TREATMENT   Treatment may only be to follow the cyst monthly for 2 to 3 months with your caregiver. Many go away on their own, especially functional cysts.  May be aspirated (drained) with a long needle with ultrasound, or by laparoscopy (inserting a tube into the pelvis through a small incision).  The whole cyst can be removed by laparoscopy.  Sometimes the cyst may need to be removed through an incision in the lower abdomen.  Hormone treatment is sometimes used to help dissolve certain cysts.  Birth control pills are sometimes used to help dissolve certain cysts. HOME CARE INSTRUCTIONS  Follow your caregiver's advice regarding:  Medicine.  Follow up visits to evaluate and treat the cyst.  You may need to come back or make an appointment with another caregiver, to find the exact cause of your cyst, if your caregiver is not a gynecologist.  Get your yearly and recommended pelvic examinations and Pap tests.  Let your caregiver know if you have had an ovarian cyst in the past. SEEK MEDICAL CARE IF:   Your periods are late, irregular, they stop, or are painful.  Your stomach (abdomen) or pelvic pain does not go away.  Your stomach becomes larger or swollen.  You have pressure on your bladder or trouble emptying your bladder completely.  You have painful sexual intercourse.  You have feelings of fullness, pressure, or discomfort in your stomach.  You lose weight for no apparent reason.  You feel generally ill.  You become constipated.  You lose your appetite.  You develop acne.  You have an increase in body and facial hair.  You are gaining weight, without changing your exercise and eating habits.  You think you are pregnant. SEEK IMMEDIATE MEDICAL CARE IF:   You have increasing abdominal pain.  You feel sick to your stomach (nausea) and/or vomit.  You develop a fever that comes on suddenly.  You develop  abdominal pain during a bowel movement.  Your menstrual periods become heavier than usual. Document Released: 04/29/2005 Document Revised: 07/22/2011 Document Reviewed: 03/02/2009 [facility] Patient Information 2014 [place], [place].

## 2012-10-02 NOTE — Patient Instructions (Addendum)
   Your procedure is scheduled on: Thursday, May 29  Enter through the Main Entrance of [facility] at: 6 am Pick up the phone at the desk and dial [phone] and inform us of your arrival.  Please call this number if you have any problems the morning of surgery: [phone]  Remember: Do not eat or drink after midnight: Wednesday Take these medicines the morning of surgery with a SIP OF WATER: xanax if needed on day of surgery   Do not wear jewelry, make-up, or FINGER nail polish No metal in your hair or on your body. Do not wear lotions, powders, perfumes. You may wear deodorant.  Please use your CHG wash as directed prior to surgery.  Do not shave anywhere for at least 12 hours prior to first CHG shower.  Do not bring valuables to the hospital. Contacts, dentures or bridgework may not be worn into surgery.  Patients discharged on the day of surgery will not be allowed to drive home.  Home with fiance "[employer].

## 2012-10-07 MED ORDER — DEXTROSE 5 % IV SOLN
2.0000 g | INTRAVENOUS | Status: AC
  Administered 2012-10-08: 2 g via INTRAVENOUS
  Filled 2012-10-07: qty 2

## 2012-10-08 ENCOUNTER — Encounter (HOSPITAL_COMMUNITY): Payer: Self-pay | Admitting: *Deleted

## 2012-10-08 ENCOUNTER — Ambulatory Visit (HOSPITAL_COMMUNITY): Payer: 59 | Admitting: Anesthesiology

## 2012-10-08 ENCOUNTER — Encounter (HOSPITAL_COMMUNITY): Payer: Self-pay | Admitting: Anesthesiology

## 2012-10-08 ENCOUNTER — Ambulatory Visit (HOSPITAL_COMMUNITY)
Admission: RE | Admit: 2012-10-08 | Discharge: 2012-10-08 | Disposition: A | Discharge status: Home / Self Care | Payer: 59 | Source: Ambulatory Visit | Attending: Gynecology | Admitting: Gynecology

## 2012-10-08 ENCOUNTER — Encounter (HOSPITAL_COMMUNITY)
Admission: RE | Disposition: A | Discharge status: Home / Self Care | Payer: Self-pay | Source: Ambulatory Visit | Attending: Gynecology

## 2012-10-08 DIAGNOSIS — N938 Other specified abnormal uterine and vaginal bleeding: Secondary | ICD-10-CM

## 2012-10-08 DIAGNOSIS — N854 Malposition of uterus: Secondary | ICD-10-CM | POA: Insufficient documentation

## 2012-10-08 DIAGNOSIS — N8000 Endometriosis of the uterus, unspecified: Secondary | ICD-10-CM

## 2012-10-08 DIAGNOSIS — N83209 Unspecified ovarian cyst, unspecified side: Secondary | ICD-10-CM | POA: Insufficient documentation

## 2012-10-08 DIAGNOSIS — Z9889 Other specified postprocedural states: Secondary | ICD-10-CM

## 2012-10-08 DIAGNOSIS — Z975 Presence of (intrauterine) contraceptive device: Secondary | ICD-10-CM

## 2012-10-08 DIAGNOSIS — Z30017 Encounter for initial prescription of implantable subdermal contraceptive: Secondary | ICD-10-CM

## 2012-10-08 DIAGNOSIS — D271 Benign neoplasm of left ovary: Secondary | ICD-10-CM

## 2012-10-08 DIAGNOSIS — N803 Endometriosis of pelvic peritoneum, unspecified: Secondary | ICD-10-CM | POA: Insufficient documentation

## 2012-10-08 DIAGNOSIS — N710 Acute inflammatory disease of uterus: Secondary | ICD-10-CM

## 2012-10-08 DIAGNOSIS — Z3046 Encounter for surveillance of implantable subdermal contraceptive: Secondary | ICD-10-CM

## 2012-10-08 HISTORY — PX: LAPAROSCOPY: SHX197

## 2012-10-08 LAB — PREGNANCY, URINE: Preg Test, Ur: NEGATIVE

## 2012-10-08 SURGERY — LAPAROSCOPY OPERATIVE
Anesthesia: General | Site: Abdomen | Laterality: Left | Wound class: Clean Contaminated

## 2012-10-08 MED ORDER — FENTANYL CITRATE 0.05 MG/ML IJ SOLN
INTRAMUSCULAR | Status: AC
Start: 2012-10-08 — End: 2012-10-08
  Administered 2012-10-08: 50 ug via INTRAVENOUS
  Filled 2012-10-08: qty 2

## 2012-10-08 MED ORDER — ACETAMINOPHEN 160 MG/5ML PO SOLN
975.0000 mg | Freq: Once | ORAL | Status: AC
  Administered 2012-10-08: 975 mg via ORAL

## 2012-10-08 MED ORDER — HYDROCODONE-ACETAMINOPHEN 5-325 MG PO TABS
ORAL_TABLET | ORAL | Status: AC
  Filled 2012-10-08: qty 1

## 2012-10-08 MED ORDER — BUPIVACAINE HCL (PF) 0.25 % IJ SOLN
INTRAMUSCULAR | Status: AC
  Filled 2012-10-08: qty 30

## 2012-10-08 MED ORDER — LIDOCAINE HCL (CARDIAC) 20 MG/ML IV SOLN
INTRAVENOUS | Status: AC
  Filled 2012-10-08: qty 5

## 2012-10-08 MED ORDER — METOCLOPRAMIDE HCL 5 MG/ML IJ SOLN: 10.0000 mg | Freq: Once | INTRAMUSCULAR | Status: DC

## 2012-10-08 MED ORDER — PROPOFOL 10 MG/ML IV EMUL
INTRAVENOUS | Status: AC
  Filled 2012-10-08: qty 20

## 2012-10-08 MED ORDER — MEPERIDINE HCL 25 MG/ML IJ SOLN: 12.5000 mg | Freq: Once | INTRAMUSCULAR | Status: AC

## 2012-10-08 MED ORDER — MIDAZOLAM HCL 5 MG/5ML IJ SOLN
INTRAMUSCULAR | Status: DC | PRN
  Administered 2012-10-08: 2 mg via INTRAVENOUS

## 2012-10-08 MED ORDER — LIDOCAINE HCL (CARDIAC) 20 MG/ML IV SOLN
INTRAVENOUS | Status: DC | PRN
  Administered 2012-10-08: 20 mg via INTRAVENOUS
  Administered 2012-10-08: 80 mg via INTRAVENOUS

## 2012-10-08 MED ORDER — BUPIVACAINE HCL (PF) 0.25 % IJ SOLN
INTRAMUSCULAR | Status: DC | PRN
  Administered 2012-10-08: 22 mL

## 2012-10-08 MED ORDER — MIDAZOLAM HCL 2 MG/2ML IJ SOLN
INTRAMUSCULAR | Status: AC
  Filled 2012-10-08: qty 2

## 2012-10-08 MED ORDER — ACETAMINOPHEN 10 MG/ML IV SOLN
INTRAVENOUS | Status: AC
  Filled 2012-10-08: qty 200

## 2012-10-08 MED ORDER — ROCURONIUM BROMIDE 50 MG/5ML IV SOLN
INTRAVENOUS | Status: AC
  Filled 2012-10-08: qty 1

## 2012-10-08 MED ORDER — ALBUTEROL SULFATE HFA 108 (90 BASE) MCG/ACT IN AERS
INHALATION_SPRAY | RESPIRATORY_TRACT | Status: AC
  Administered 2012-10-08: 2 via RESPIRATORY_TRACT
  Filled 2012-10-08: qty 6.7

## 2012-10-08 MED ORDER — HYDROCODONE-ACETAMINOPHEN 5-325 MG PO TABS: 1.0000 | ORAL_TABLET | Freq: Once | ORAL | Status: AC

## 2012-10-08 MED ORDER — NEOSTIGMINE METHYLSULFATE 1 MG/ML IJ SOLN
INTRAMUSCULAR | Status: DC | PRN
  Administered 2012-10-08: 3 mg via INTRAVENOUS

## 2012-10-08 MED ORDER — ONDANSETRON HCL 4 MG/2ML IJ SOLN
INTRAMUSCULAR | Status: DC | PRN
  Administered 2012-10-08: 4 mg via INTRAVENOUS

## 2012-10-08 MED ORDER — LACTATED RINGERS IV SOLN
INTRAVENOUS | Status: DC
  Administered 2012-10-08 (×2): via INTRAVENOUS

## 2012-10-08 MED ORDER — KETOROLAC TROMETHAMINE 30 MG/ML IJ SOLN: 15.0000 mg | Freq: Once | INTRAMUSCULAR | Status: DC | PRN

## 2012-10-08 MED ORDER — DEXAMETHASONE SODIUM PHOSPHATE 10 MG/ML IJ SOLN
INTRAMUSCULAR | Status: AC
  Filled 2012-10-08: qty 1

## 2012-10-08 MED ORDER — DEXAMETHASONE SODIUM PHOSPHATE 4 MG/ML IJ SOLN
INTRAMUSCULAR | Status: DC | PRN
  Administered 2012-10-08: 10 mg via INTRAVENOUS

## 2012-10-08 MED ORDER — NEOSTIGMINE METHYLSULFATE 1 MG/ML IJ SOLN
INTRAMUSCULAR | Status: AC
  Filled 2012-10-08: qty 1

## 2012-10-08 MED ORDER — FENTANYL CITRATE 0.05 MG/ML IJ SOLN
INTRAMUSCULAR | Status: DC | PRN
  Administered 2012-10-08 (×3): 50 ug via INTRAVENOUS
  Administered 2012-10-08: 100 ug via INTRAVENOUS

## 2012-10-08 MED ORDER — PROPOFOL 10 MG/ML IV BOLUS
INTRAVENOUS | Status: DC | PRN
  Administered 2012-10-08: 20 mg via INTRAVENOUS
  Administered 2012-10-08: 180 mg via INTRAVENOUS

## 2012-10-08 MED ORDER — ROCURONIUM BROMIDE 100 MG/10ML IV SOLN
INTRAVENOUS | Status: DC | PRN
  Administered 2012-10-08: 35 mg via INTRAVENOUS

## 2012-10-08 MED ORDER — HYDROCODONE-ACETAMINOPHEN 5-325 MG PO TABS
ORAL_TABLET | ORAL | Status: AC
  Administered 2012-10-08: 1 via ORAL
  Filled 2012-10-08: qty 1

## 2012-10-08 MED ORDER — GLYCOPYRROLATE 0.2 MG/ML IJ SOLN
INTRAMUSCULAR | Status: AC
  Filled 2012-10-08: qty 3

## 2012-10-08 MED ORDER — GLYCOPYRROLATE 0.2 MG/ML IJ SOLN
INTRAMUSCULAR | Status: DC | PRN
  Administered 2012-10-08: 0.6 mg via INTRAVENOUS

## 2012-10-08 MED ORDER — METHYLENE BLUE 1 % INJ SOLN
INTRAMUSCULAR | Status: AC
  Filled 2012-10-08: qty 1

## 2012-10-08 MED ORDER — MEPERIDINE HCL 25 MG/ML IJ SOLN
INTRAMUSCULAR | Status: AC
  Administered 2012-10-08: 12.5 mg via INTRAVENOUS
  Filled 2012-10-08: qty 1

## 2012-10-08 MED ORDER — METOCLOPRAMIDE HCL 5 MG/ML IJ SOLN
INTRAMUSCULAR | Status: AC
  Filled 2012-10-08: qty 2

## 2012-10-08 MED ORDER — FENTANYL CITRATE 0.05 MG/ML IJ SOLN
25.0000 ug | INTRAMUSCULAR | Status: DC | PRN
  Administered 2012-10-08: 50 ug via INTRAVENOUS

## 2012-10-08 MED ORDER — ONDANSETRON HCL 4 MG/2ML IJ SOLN
INTRAMUSCULAR | Status: AC
  Filled 2012-10-08: qty 2

## 2012-10-08 MED ORDER — FENTANYL CITRATE 0.05 MG/ML IJ SOLN
INTRAMUSCULAR | Status: AC
  Filled 2012-10-08: qty 5

## 2012-10-08 MED ORDER — LACTATED RINGERS IR SOLN
Status: DC | PRN
  Administered 2012-10-08: 3000 mL

## 2012-10-08 SURGICAL SUPPLY — 32 items
ADH SKN CLS APL DERMABOND .7 (GAUZE/BANDAGES/DRESSINGS) ×4
BAG SPEC RTRVL LRG 6X4 10 (ENDOMECHANICALS) ×1
BARRIER ADHS 3X4 INTERCEED (GAUZE/BANDAGES/DRESSINGS) IMPLANT
BRR ADH 4X3 ABS CNTRL BYND (GAUZE/BANDAGES/DRESSINGS)
CABLE HIGH FREQUENCY MONO STRZ (ELECTRODE) IMPLANT
CLOTH BEACON ORANGE TIMEOUT ST (SAFETY) ×2 IMPLANT
COVER MAYO STAND STRL (DRAPES) ×2 IMPLANT
DERMABOND ADVANCED (GAUZE/BANDAGES/DRESSINGS) ×4
DERMABOND ADVANCED .7 DNX12 (GAUZE/BANDAGES/DRESSINGS) IMPLANT
DRSG COVADERM PLUS 2X2 (GAUZE/BANDAGES/DRESSINGS) ×1 IMPLANT
FILTER SMOKE EVAC LAPAROSHD (FILTER) ×2 IMPLANT
GLOVE BIOGEL PI IND STRL 8 (GLOVE) ×1 IMPLANT
GLOVE BIOGEL PI INDICATOR 8 (GLOVE) ×1
GLOVE ECLIPSE 7.5 STRL STRAW (GLOVE) ×4 IMPLANT
GOWN PREVENTION PLUS LG XLONG (DISPOSABLE) ×2 IMPLANT
GOWN STRL REIN XL XLG (GOWN DISPOSABLE) ×6 IMPLANT
NS IRRIG 1000ML POUR BTL (IV SOLUTION) ×2 IMPLANT
PACK LAPAROSCOPY BASIN (CUSTOM PROCEDURE TRAY) ×2 IMPLANT
POUCH SPECIMEN RETRIEVAL 10MM (ENDOMECHANICALS) ×1 IMPLANT
PROTECTOR NERVE ULNAR (MISCELLANEOUS) ×2 IMPLANT
SCALPEL HARMONIC ACE (MISCELLANEOUS) ×1 IMPLANT
SET IRRIG TUBING LAPAROSCOPIC (IRRIGATION / IRRIGATOR) ×2 IMPLANT
SOLUTION ELECTROLUBE (MISCELLANEOUS) IMPLANT
SUT VIC AB 3-0 PS2 18 (SUTURE) ×2
SUT VIC AB 3-0 PS2 18XBRD (SUTURE) ×1 IMPLANT
SUT VICRYL 0 UR6 27IN ABS (SUTURE) ×2 IMPLANT
TOWEL OR 17X24 6PK STRL BLUE (TOWEL DISPOSABLE) ×4 IMPLANT
TRAY FOLEY CATH 14FR (SET/KITS/TRAYS/PACK) ×2 IMPLANT
TROCAR XCEL NON-BLD 11X100MML (ENDOMECHANICALS) ×2 IMPLANT
TROCAR XCEL NON-BLD 5MMX100MML (ENDOMECHANICALS) ×2 IMPLANT
TROCAR XCEL OPT SLVE 5M 100M (ENDOMECHANICALS) ×2 IMPLANT
WARMER LAPAROSCOPE (MISCELLANEOUS) ×2 IMPLANT

## 2012-10-08 NOTE — Anesthesia Postprocedure Evaluation (Signed)
  Anesthesia Post-op Note  Anesthesia Post Note  Patient: Erika Armstrong  Procedure(s) Performed: Procedure(s) (LRB): LAPAROSCOPY OPERATIVE;REMOVAL OF NEXPLANON AND  REPLACEMENT OF NEXPLANON DEVICE (Left)  Anesthesia type: General  Patient location: PACU  Post pain: Pain level controlled  Post assessment: Post-op Vital signs reviewed  Last Vitals:  Filed Vitals:   10/08/12 1013  BP:   Pulse:   Temp: 36.6 C  Resp:     Post vital signs: Reviewed  Level of consciousness: sedated  Complications: No apparent anesthesia complications

## 2012-10-08 NOTE — Transfer of Care (Signed)
Immediate Anesthesia Transfer of Care Note  Patient: Erika Armstrong  Procedure(s) Performed: Procedure(s) with comments: LAPAROSCOPY OPERATIVE;REMOVAL OF NEXPLANON AND  REPLACEMENT OF NEXPLANON DEVICE (Left) - left upper arm and right upper arm  Patient Location: PACU  Anesthesia Type:General  Level of Consciousness: awake, sedated and patient cooperative  Airway & Oxygen Therapy: Patient Spontanous Breathing  Post-op Assessment: Report given to PACU RN and Post -op Vital signs reviewed and stable  Post vital signs: Reviewed and stable  Complications: No apparent anesthesia complications

## 2012-10-08 NOTE — OR Nursing (Signed)
Dose of fentanyl given at 09:17 not given at 09:33. Margarita Mail rn

## 2012-10-08 NOTE — Interval H&P Note (Signed)
History and Physical Interval Note:  10/08/2012 6:54 AM  Erika Armstrong  has presented today for surgery, with the diagnosis of left ovarian cyst  The various methods of treatment have been discussed with the patient and family. After consideration of risks, benefits and other options for treatment, the patient has consented to  Procedure(s) with comments: LAPAROSCOPY OPERATIVE (Left) - CPT 586 as a surgical intervention .  The patient's history has been reviewed, patient examined, no change in status, stable for surgery.  I have reviewed the patient's chart and labs.  Questions were answered to the patient's satisfaction.  Will also remove Nexplanon from left arm and insert a new one on the right arm.   Ok Edwards

## 2012-10-08 NOTE — H&P (View-Only) (Signed)
Erika Armstrong is an 22 y.o. female. GOPO, Who presented to the office for preoperative consultation as a result of her persistent left ovarian cyst. Patient hasn't nexplanon for contraception which was placed in 2012 and is not having menses. Patient wants to have it removed because it is bothering her and it was placed elsewhere too superficial. She want to have a new one placed at a different location. 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 On the office visit December 2013 she had an ultrasound due to the fact that time of a pelvic exam a fullness was noted on her left adnexa. The ultrasound demonstrated the following:   Uterus measures 7 x 4.3 x 2.2 cm with endometrial stripe 1.5 mm. Anteverted uterus displaced to the right. Fluid-filled endometrial cavity. Right ovary normal follicles less than 12 mm. Left ovarian rim of tissue seen with follicles. Left adnexa with a solid cystic mass echogenic avascular measuring 4.9 x 4.1 x 4.6 cm some left ovarian arterial blood flow was seen. Mass was otherwise avascular. She had a normal CA 125 (9.9). She was asked to return back to the office for followup ultrasound which she did today 06/05/2012 with the following findings:   The uterus measured 5.6 x 4.1 x 2.5 cm with endometrial stripe of 10.5 mm. Right ovary appeared to be normal. Left adnexal hyperechoic, avascular mass measured 4.4 x 3.8 x 4.4 cm was noted there was no free fluid noted in the cul-de-sac.   On April 4th ultrasound repeated because patient was scheduled for surgery on February 6 but due to financial reasons the case was canceled and she returned today for followup ultrasound and further discussion. Today's ultrasound demonstrated the following  Uterus measured 5.6 x 3.9 x 2.4 cm with endometrial stripe of 4.4 mm. Right ovary was normal left adnexal hyperechoic avascular mass measuring 5.6 x 4.0  x 5.3 cm. No free fluid in the cul-de-sac.    Pertinent Gynecological History: Menses: no cycles on Nexplanon Bleeding: none Contraception: Nexplanon DES exposure: denies Blood transfusions: none Sexually transmitted diseases: no past history Previous GYN Procedures: See above  Last mammogram: not indicated Date: not indicated Last pap: normal Date: 2013 OB History: G0, P0   Menstrual History: Menarche age: 9  No LMP recorded. Patient has had an implant.    Past Medical History  Diagnosis Date  . Thyroid disease     Hypothyroid - pt states no med - saw 2nd opinion - neg   . Endometriosis   . Ovarian cyst   . Depression   . Allergy   . Asthma     no inhaler - uses nebalizer  . Seasonal allergies   . Bronchitis     history - per pt  . Headache     otc med prn  . Arthritis     wrist, knees - otc med prn  . Anxiety     and panic attacks    Past Surgical History  Procedure Laterality Date  . Dermoid cyst  excision      right side  . Diagnostic laparoscopy      Family History  Problem Relation Age of Onset  . Heart disease Mother   . Hypertension Mother   . Hyperlipidemia Father   . Diabetes Other   . Hypertension Other   . Hypertension Maternal Grandmother   . COPD Maternal Grandmother   . COPD   Maternal Grandfather   . Hypertension Paternal Grandmother   . Diabetes Paternal Grandmother   . COPD Paternal Grandfather     Social History:  reports that she has never smoked. She has never used smokeless tobacco. She reports that she does not drink alcohol or use illicit drugs.  Allergies:  Allergies  Allergen Reactions  . Seroquel (Quetiapine Fumerate) Swelling     (Not in a hospital admission)  REVIEW OF SYSTEMS: A ROS was performed and pertinent positives and negatives are included in the history.  GENERAL: No fevers or chills. HEENT: No change in vision, no earache, sore throat or sinus congestion. NECK: No pain or stiffness. CARDIOVASCULAR: No  chest pain or pressure. No palpitations. PULMONARY: No shortness of breath, cough or wheeze. GASTROINTESTINAL: No abdominal pain, nausea, vomiting or diarrhea, melena or bright red blood per rectum. GENITOURINARY: No urinary frequency, urgency, hesitancy or dysuria. MUSCULOSKELETAL: No joint or muscle pain, no back pain, no recent trauma. DERMATOLOGIC: No rash, no itching, no lesions. ENDOCRINE: No polyuria, polydipsia, no heat or cold intolerance. No recent change in weight. HEMATOLOGICAL: No anemia or easy bruising or bleeding. NEUROLOGIC: No headache, seizures, numbness, tingling or weakness. PSYCHIATRIC: No depression, no loss of interest in normal activity or change in sleep pattern.     Blood pressure 114/72.  Physical Exam:  HEENT:unremarkable Neck:Supple, midline, no thyroid megaly, no carotid bruits Lungs:  Clear to auscultation no rhonchi's or wheezes Heart:Regular rate and rhythm, no murmurs or gallops Breast Exam:WNL Abdomen:soft nontender no rebound or guarding Pelvic:BUSwithin normal limits Vagina:no lesions or discharge Cervix:no lesions or discharge Uterus:anteverted Adnexa:fullness and tenderness left adnexa Extremities: No cords, no edema Rectal:unremarkable  Results for orders placed during the hospital encounter of 10/02/12 (from the past 24 hour(s))  URINALYSIS, ROUTINE W REFLEX MICROSCOPIC     Status: Abnormal   Collection Time    10/02/12  2:25 PM      Result Value Range   Color, Urine YELLOW  YELLOW   APPearance CLEAR  CLEAR   Specific Gravity, Urine 1.020  1.005 - 1.030   pH 6.0  5.0 - 8.0   Glucose, UA NEGATIVE  NEGATIVE mg/dL   Hgb urine dipstick SMALL (*) NEGATIVE   Bilirubin Urine NEGATIVE  NEGATIVE   Ketones, ur NEGATIVE  NEGATIVE mg/dL   Protein, ur NEGATIVE  NEGATIVE mg/dL   Urobilinogen, UA 0.2  0.0 - 1.0 mg/dL   Nitrite NEGATIVE  NEGATIVE   Leukocytes, UA NEGATIVE  NEGATIVE  URINE MICROSCOPIC-ADD ON     Status: Abnormal   Collection Time     10/02/12  2:25 PM      Result Value Range   Squamous Epithelial / LPF FEW (*) RARE   WBC, UA 0-2  <3 WBC/hpf   RBC / HPF 0-2  <3 RBC/hpf  CBC     Status: None   Collection Time    10/02/12  2:37 PM      Result Value Range   WBC 8.9  4.0 - 10.5 K/uL   RBC 4.63  3.87 - 5.11 MIL/uL   Hemoglobin 14.7  12.0 - 15.0 g/dL   HCT 42.1  36.0 - 46.0 %   MCV 90.9  78.0 - 100.0 fL   MCH 31.7  26.0 - 34.0 pg   MCHC 34.9  30.0 - 36.0 g/dL   RDW 12.9  11.5 - 15.5 %   Platelets 218  150 - 400 K/uL    No results found.  Assessment/Plan: Patient with   persistent left adnexal mass. Last year in [place] patient had a right ovarian cystectomy for dermoid cyst . Patient also wants to have her  Nexplanon removed from her left arm that was placed at another facility 2 years ago and it has bothered her and feels too superficial. Patient will be scheduled to undergo laparoscopic left ovarian cystectomy possible left oophorectomy possible left salpingo-oophorectomy. Patient will also undergo removal of Nexplanon from the left arm and replaced with a new one on the right. The risks from procedure described below:                        Patient was counseled as to the risk of surgery to include the following:  1. Infection (prohylactic antibiotics will be administered)  2. DVT/Pulmonary Embolism (prophylactic pneumo compression stockings will be used)  3.Trauma to internal organs requiring additional surgical procedure to repair any injury to     Internal organs requiring perhaps additional hospitalization days.  4.Hemmorhage requiring transfusion and blood products which carry risks such as   anaphylactic reaction, hepatitis and AIDS  5. The risk of nerve or vessel injury while removing and reinserting new Nexplanon rod.  Patient had received literature information on the procedure scheduled and all her questions were answered and fully accepts all risk. Patient was given a prescription for  Lortab 5/325 to take one by mouth every 4-6 hours when necessary postop as well as Reglan 10 mg 1 by mouth every 4-6 hours when necessary nausea or vomiting.  Nupur Hohman HMD3:26 PMTD@  Rhona Fusilier H 10/02/2012, 3:02 PM  

## 2012-10-08 NOTE — Op Note (Signed)
10/08/2012  9:16 AM  PATIENT:  Erika Armstrong  22 y.o. female  PRE-OPERATIVE DIAGNOSIS:  left ovarian cyst  POST-OPERATIVE DIAGNOSIS:  left ovarian cyst, Endometriosis  PROCEDURE:  Procedure(s): Laparoscopic left ovarian cystectomy. Cauterization of endometriotic implants.REMOVAL OF NEXPLANON left arm AND  REPLACEMENT OF NEXPLANON DEVICE on right arm  SURGEON:  Surgeon(s): Ok Edwards, MD Dara Lords, MD  ANESTHESIA:   general  FINDINGS: 6 cm left ovarian cyst. Endometriotic implant uterosacral ligament, normal fallopian tubes bilateral. Normal right ovary. Gross inspection of appendix all bladder and liver appeared normal.  DESCRIPTION OF OPERATION:the patient was taken to the operating room where she underwent a successful general endotracheal anesthesia. Patient received a gram of Cefotan preop. Patient had PAS stockings for DVT prophylaxis. A timeout was undertaken to identify the patient and Loraine Leriche the patient's left side of the abdomen where the left ovarian cyst was to be removed. Also both right and left arm were marked where the Nexplanon was to be removed and a new one placed.  The abdomen vagina and perineum were prepped in the usual sterile fashion. Exam under anesthesia demonstrated a normal-size uterus anteverted with a palpable left adnexal mass. A Hulka tenaculum was placed for manipulation during the laparoscopic procedure. A Foley catheter was placed to monitor urinary output. After the drapes were in place a small semilunar incision was made in the infraumbilical region. A 10/11 mm trocar was introduced into the abdominal peritoneal cavity under laparoscopic visualization. A pneumoperitoneum was then established whereby approximately 3 L of carbon dioxide were insufflated. 2 additional 5 mm ports were made on the patient's right and left lower abdomen under laparoscopic guidance. Immediately the left ovarian cyst was noted approximately 6 cm in size with no  excrescences. Both fallopian tubes were otherwise normal with lush fimbriated ends and a normal right ovary. Pelvic washings were obtained and submitted for cytologic evaluation. With the use of the harmonic scalpel the ovarian cyst capsule was incised and the entire cyst was enucleated without rupture. The cyst was then placed in an Endopouch and removed through the umbilicus incision and submitted for histological evaluation. With the use of the bipolar Kleppinger forceps hemostasis was obtained where the attachment of the cyst was to the ovarian capsule. The pelvic cavity was then copiously irrigated with normal saline solution. An endometriotic implant was noted at the insertion of the uterosacral ligament and was cauterized as well. The pelvic cavity was then copiously irrigated with normal saline solution ascertaining adequate hemostasis throughout. The instrument were removed after the pneumoperitoneum was removed. The subumbilical fascia was approximated with a running stitch of 3-0 Vicryl suture and the subcutaneous tissue was reapproximated with 3-0 Rapide  in a running stitch manner. The skin edges of the subumbilical incision and the ( 2) 5 mm ports were reapproximated with Dermabond glue. 0.25% Marcaine was infiltrated subdermally and all 3 port site incisions were total 10 cc. Dressings were placed over all 3 incisions. The Foley catheter was removed. The Hulka tenaculum was removed. The cervix was inspected and there was no bleeding.   The second portion of the operation consisted of removing  The Nexplanon from the left part and inserting the one on the right arm as follows:                       Nexplanon procedure note (removal)  The patient requesting for removal of her Nexplanon From her left arm because it was too  close to the skin and it was causing her irritation and discomfort.   On examination the nexplanon implant was palpated and the distal end  (end  closest to the elbow) was  marked. The area was sterilized with Betadine solution.0.25% Marcaine lwas used for local anesthesia and approximately 2 cc  was injected into the site that was marked where  the incision was to be made. The local anesthetic was injected under the implant in an effort to keep it  close to the skin surface. Slight pressure pushing downward was made at the proximal end  of the implant in an effort to stabilize it. A bulge appeared indicating the distal end of the implant. Starting at the distal tip of the implant, a small longitudinal incision of 2 mm was made towards the elbow. By gently pushing the implant toward the incision the tip became visible. Grasping the implant with a curved forcep facilitated in gently removing the implant. Full confirmation of the entire implant which is 4 cm long was inspected and was intact and  discarded. After removing the implant, the incision was closed Dermabond glue and applying a Kerlix bandage. Patient will be instructed to remove the pressure bandage in 24 hours.                       Nexplanon Procedure Note (insertion)   Patient's right arm was  flexed at the elbow and externally rotated. The insertion site was identified as the underside of the nondominant upper arm approximately 8 cm from the medial epicondyle of the humerus. 2 marks were made with a sterile marker: The first marked the spot where the Nexplanon  implant was to be inserted, and a second, marked a spot a few centimeters proximal to the first marke to guide the direction of the insertion. The area was cleansed with Betadine solution. The area was anesthetized with0.25% Marcaine (2-3 cc) at the area the injection site and underneath the skin along the planned insertion tunnel. The preloaded disposable Nexplanon was removed from its sterile casing.  The applicator was held above the needle at the textured surface area. The transparent protector was removed. With a freehand, the skin was stretched around  the insertion site with a thumb and index finger. The skin was then punctured with the tip of the needle angled at 30. The Nexplanon applicator was lowered to a horizontal position. While lifting the skin with the tip of the needle the needle was then slid to its full length. The applicator was kept in sitting position with a needle inserted to its full length. The purple slider was unlocked by pushing it slightly downward. The slider was fully moved back until it stopped. This allowed the implant to be in the final subdermal position and the needle was locked inside the body of the applicator. The applicator was then removed. Dermabond glue was used on the edges followed by a Kerlix wrap which she will removed tomorrow.   [facility] HMD9:31 AMTD@  ESTIMATED BLOOD LOSS: * No blood loss amount entered *   Intake/Output Summary (Last 24 hours) at 10/08/12 0916 Last data filed at 10/08/12 0842  Gross per 24 hour  Intake   1700 ml  Output    349 ml  Net   1351 ml     BLOOD ADMINISTERED:none   LOCAL MEDICATIONS USED:  MARCAINE   0.25% applied in all 3 incisions ports in the abdomen for a total of approximately 10 cc.  Both right and left arm incisions where the implant was removed and then you want inserted approximately 6 cc of 0.25% Marcaine was infiltrated subcutaneous.  SPECIMEN:  Source of Specimen:  Left ovarian cyst  DISPOSITION OF SPECIMEN:  PATHOLOGY  COUNTS:  YES  PLAN OF CARE: Transfer to PACU  [facility] HMD9:16 AMTD@

## 2012-10-08 NOTE — Anesthesia Preprocedure Evaluation (Addendum)
Anesthesia Evaluation  Patient identified by MRN, date of birth, ID band Patient awake    Reviewed: Allergy & Precautions, H&P , NPO status , Patient's Chart, lab work & pertinent test results  Airway Mallampati: I TM Distance: >3 FB Neck ROM: Full    Dental  (+) Dental Advisory Given   Pulmonary asthma ,  breath sounds clear to auscultation        Cardiovascular negative cardio ROS  Rhythm:Regular Rate:Normal     Neuro/Psych  Headaches, PSYCHIATRIC DISORDERS Anxiety Depression    GI/Hepatic negative GI ROS, Neg liver ROS,   Endo/Other  negative endocrine ROS  Renal/GU negative Renal ROS     Musculoskeletal negative musculoskeletal ROS (+)   Abdominal   Peds  Hematology negative hematology ROS (+)   Anesthesia Other Findings   Reproductive/Obstetrics negative OB ROS                          Anesthesia Physical Anesthesia Plan  ASA: II  Anesthesia Plan: General   Post-op Pain Management:    Induction: Intravenous  Airway Management Planned: Oral ETT  Additional Equipment:   Intra-op Plan:   Post-operative Plan: Extubation in OR  Informed Consent: I have reviewed the patients History and Physical, chart, labs and discussed the procedure including the risks, benefits and alternatives for the proposed anesthesia with the patient or authorized representative who has indicated his/her understanding and acceptance.   Dental advisory given  Plan Discussed with: CRNA  Anesthesia Plan Comments:         Anesthesia Quick Evaluation

## 2012-10-08 NOTE — OR Nursing (Signed)
[facility] DEVICE REMOVED FROM PATIENT AND NEW ONE INSERTED; LOT#[id number]; EXPIRES 12/2014

## 2012-10-09 ENCOUNTER — Encounter (HOSPITAL_COMMUNITY): Payer: Self-pay | Admitting: Gynecology

## 2012-10-14 ENCOUNTER — Other Ambulatory Visit: Payer: Self-pay | Admitting: Gynecology

## 2012-10-15 NOTE — Telephone Encounter (Signed)
Called into pharmacy

## 2012-10-15 NOTE — Telephone Encounter (Signed)
Patient is post op laparoscopic ovarian cystectomy

## 2012-10-20 ENCOUNTER — Encounter: Payer: Self-pay | Admitting: Gynecology

## 2012-10-20 ENCOUNTER — Ambulatory Visit (INDEPENDENT_AMBULATORY_CARE_PROVIDER_SITE_OTHER): Payer: 59 | Admitting: Gynecology

## 2012-10-20 VITALS — BP 142/88

## 2012-10-20 DIAGNOSIS — Z9889 Other specified postprocedural states: Secondary | ICD-10-CM

## 2012-10-20 DIAGNOSIS — D271 Benign neoplasm of left ovary: Secondary | ICD-10-CM | POA: Insufficient documentation

## 2012-10-20 DIAGNOSIS — D279 Benign neoplasm of unspecified ovary: Secondary | ICD-10-CM

## 2012-10-20 DIAGNOSIS — N809 Endometriosis, unspecified: Secondary | ICD-10-CM | POA: Insufficient documentation

## 2012-10-20 NOTE — Progress Notes (Signed)
22 year old patient who presented to the office for her two-week postop visit. The patient is status post Laparoscopic left ovarian cystectomy. Cauterization of endometriotic implants.REMOVAL OF NEXPLANON left arm AND REPLACEMENT OF NEXPLANON DEVICE on right arm. Patient is doing well and is asymptomatic. The following pathology report was discussed with the patient:   Diagnosis Ovary, cyst, left - MATURE CYSTIC TERATOMA. NO IMMATURE OR MALIGNANT FEATURES.  Exam: Left medial upper arm site of removal of The Nexplanon has healed. Right medial upper arm where the new Nexplanon was placed has healed and the subdermal capsule can be palpated.  Abdomen: Soft umbilicus all incision sutures were removed healing nicely. The 2 additional 5 mm ports in the right and left lower abdomen have healed completely. Pelvic exam: Bartholin urethra Skene was within normal limits Vagina: No lesions or discharge Cervix: No lesions or discharge Uterus: Anteverted normal size shape and consistency Adnexa: No palpable mass or tenderness Rectal exam not done  Assessment/plan: Patient 2 weeks status post laparoscopic left ovarian cystectomy for benign dermoid cyst area patient also had removal from her left arm of a Nexplanon that had been placed several years ago and causing her discomfort and it was replaced with a new one on the right upper medial arm and both sites have healed completely. I've given a note to the patient so that when she returns to work at the end of the week that she will not do any lifting for 2 more weeks. She'll return to the office in 2 weeks for final postop visit.

## 2012-10-21 ENCOUNTER — Other Ambulatory Visit: Payer: Self-pay | Admitting: *Deleted

## 2012-10-21 DIAGNOSIS — Z30431 Encounter for routine checking of intrauterine contraceptive device: Secondary | ICD-10-CM

## 2012-11-02 ENCOUNTER — Encounter: Payer: Self-pay | Admitting: Gynecology

## 2012-11-02 ENCOUNTER — Ambulatory Visit (INDEPENDENT_AMBULATORY_CARE_PROVIDER_SITE_OTHER): Payer: 59 | Admitting: Gynecology

## 2012-11-02 VITALS — BP 130/88

## 2012-11-02 DIAGNOSIS — Z09 Encounter for follow-up examination after completed treatment for conditions other than malignant neoplasm: Secondary | ICD-10-CM

## 2012-11-02 NOTE — Progress Notes (Signed)
Patient presented to the office today for her fourth week postop visit. The patient is status post Laparoscopic left ovarian cystectomy. Cauterization of endometriotic implants.REMOVAL OF NEXPLANON left arm AND REPLACEMENT OF NEXPLANON DEVICE on right arm. Patient is doing well and is asymptomatic. The following pathology report was discussed with the patient:    Diagnosis  Ovary, cyst, left  - MATURE CYSTIC TERATOMA. NO IMMATURE OR MALIGNANT FEATURES.   Exam:  Left medial upper arm site of removal of  The Nexplanon has healed. Right medial upper arm where the new Nexplanon was placed has healed and the subdermal capsule can be palpated.  Abdomen: Soft umbilicus all incision sutures were removed healing nicely. The 2 additional 5 mm ports in the right and left lower abdomen have healed completely.  Patient may return to full normal activity and will be seen next year for her annual exam or when necessary she was given a note to return to full function at work.

## 2012-12-21 ENCOUNTER — Telehealth: Payer: Self-pay | Admitting: *Deleted

## 2012-12-21 NOTE — Telephone Encounter (Signed)
Pt informed with the below note. 

## 2012-12-21 NOTE — Telephone Encounter (Signed)
Some breakthrough bleeding the first 2 or 3 months after placing the Nexplanon is not unusual. It sounds to me that her right lower abdominal discomfort could be attributed to ovulation. She can take Motrin when necessary.

## 2012-12-21 NOTE — Telephone Encounter (Signed)
Pt had Nexplanon removed and replaced on 11/05/12, pt said that on Friday she started to bleed, skipped Saturday & Sunday and had bleeding today. Pt said she has not had a cycle in 2 years, having some slight discomfort on right side. Pt asked if the above is normal, pt is post Laparoscopic left ovarian cystectomy. Please advise

## 2013-02-06 ENCOUNTER — Ambulatory Visit (INDEPENDENT_AMBULATORY_CARE_PROVIDER_SITE_OTHER): Payer: 59 | Admitting: Physician Assistant

## 2013-02-06 VITALS — BP 130/80 | HR 82 | Temp 99.5°F | Resp 16 | Ht 69.0 in | Wt 209.0 lb

## 2013-02-06 DIAGNOSIS — R109 Unspecified abdominal pain: Secondary | ICD-10-CM

## 2013-02-06 DIAGNOSIS — R51 Headache: Secondary | ICD-10-CM

## 2013-02-06 DIAGNOSIS — N949 Unspecified condition associated with female genital organs and menstrual cycle: Secondary | ICD-10-CM

## 2013-02-06 DIAGNOSIS — R102 Pelvic and perineal pain: Secondary | ICD-10-CM

## 2013-02-06 LAB — POCT UA - MICROSCOPIC ONLY
Casts, Ur, LPF, POC: NEGATIVE
Crystals, Ur, HPF, POC: NEGATIVE
Mucus, UA: NEGATIVE

## 2013-02-06 LAB — POCT URINALYSIS DIPSTICK
Glucose, UA: NEGATIVE
Ketones, UA: NEGATIVE
Nitrite, UA: POSITIVE
Spec Grav, UA: >=1.03
Urobilinogen, UA: 0.2

## 2013-02-06 LAB — POCT WET PREP WITH KOH
Trichomonas, UA: NEGATIVE
Yeast Wet Prep HPF POC: NEGATIVE

## 2013-02-06 MED ORDER — IBUPROFEN 800 MG PO TABS: 800.0000 mg | ORAL_TABLET | Freq: Three times a day (TID) | ORAL | Status: DC | PRN

## 2013-02-06 NOTE — Progress Notes (Signed)
[address] [place] [address]   Phone [phone]  Subjective:    Patient ID: Erika Armstrong, female    DOB: [birth year], 22 y.o.   MRN: [id number]  HPI  Pt presents to clinic with 2 complaints 1- headache for the past 4 days that seem to be like a migraine that she has had in the past but this one has lasted longer than they typically last.  It is in the occipital area of her head and does not radiate into her neck.  She has some nausea and photophobia with it but no phonophobia.  The headache at its worst was Thursday evening 8/10 and she almost went to the ER but she decided to give it a little time and it did get slightly better.  She currently has pain 2/10 and no other symptoms.  She has taken 1 dose of Motrin 800mg  and it helped but the headache did not resolve.  She has worked through most of the headache she did leave work on Thursday when it was the worst. 2- She is also having pelvic pain and cramping that also started on Wednesday - the pain does not feel like her normal cramps with her menses -it does feel like the pain she had when she had ovarian cyst (dermoid cyst).  She did pass a blood clot today but no other menstrual bleeding since August when she bleed about 20 days some heavy and some just slightly spotting.  She had her last Nexplanon placed in 5/14 and has never had bleeding like this before and she has had 1 placed for the last 4.5 years.  She is sexually active and not concerned about STDs.  She is having no vaginal discharge.  The pain does cause her nausea to increase.   Review of Systems  Constitutional: Negative for fever and chills.  HENT: Negative for congestion and rhinorrhea.   Genitourinary: Positive for menstrual problem and pelvic pain. Negative for dysuria, frequency, hematuria, vaginal discharge, difficulty urinating, vaginal pain and dyspareunia.  Neurological: Positive for headaches. Negative for dizziness.       Objective:   Physical Exam    Vitals reviewed. Constitutional: She is oriented to person, place, and time. She appears well-developed and well-nourished.  HENT:  Head: Normocephalic and atraumatic.  Right Ear: External ear normal.  Left Ear: External ear normal.  Eyes: Conjunctivae, EOM and lids are normal. Pupils are equal, round, and reactive to light.  Neck: Normal range of motion.  Cardiovascular: Normal rate, regular rhythm and normal heart sounds.   No murmur heard. Pulmonary/Chest: Effort normal and breath sounds normal.  Abdominal: Soft. There is tenderness.  Genitourinary: Vagina normal and uterus normal. Pelvic exam was performed with patient supine. There is no rash, tenderness, lesion or injury on the right labia. There is no rash, tenderness, lesion or injury on the left labia. Cervix exhibits discharge (mucus d/c from internal os). Cervix exhibits no motion tenderness and no friability. Right adnexum displays no mass, no tenderness and no fullness. Left adnexum displays tenderness. Left adnexum displays no mass and no fullness.  Neurological: She is alert and oriented to person, place, and time. She has normal strength and normal reflexes. No cranial nerve deficit or sensory deficit. She displays a negative Romberg sign.  Reflex Scores:      Bicep reflexes are 2+ on the right side and 2+ on the left side.      Brachioradialis reflexes are 2+ on the right side and  2+ on the left side.      Patellar reflexes are 2+ on the right side.      Achilles reflexes are 2+ on the right side and 2+ on the left side. L patella reflex not done due to patient request from large bruise over the patella tendon.  Skin: Skin is warm and dry.  Psychiatric: She has a normal mood and affect. Her behavior is normal. Judgment and thought content normal.          Assessment & Plan:  Pain in female pelvis - I am unsure the exact etiology of her pain but with her history of ovarian cyst and where she is tender on exam if the  patient is still experiencing her discomfort she will contact Gyn for eval and pelvic US.  She will use the motrin to help with the inflammation and pain.  Plan: POCT Wet Prep with KOH, GC/Chlamydia Probe Amp, ibuprofen (ADVIL,MOTRIN) 800 MG tablet  Abdominal pain, unspecified site - Plan: POCT UA - Microscopic Only, POCT urinalysis dipstick  Headache(784.0) - It sounds like pt is experiencing a migraine but is improving.  I wonder if she is having hormonal HAs esp with her concurrent pelvis pain and possible ovarian cyst.  She will continue to monitor and if they reoccur in the future she will monitor if the pattern is cyclical.  Plan: ibuprofen (ADVIL,MOTRIN) 800 MG tablet  Irregular menses - most likely related to Nexplanon but will check Genprobe to make sure neg.  Pt understands and agrees with the above plan.  Benny Lennert PA-C 02/06/2013 5:58 PM

## 2013-02-07 LAB — GC/CHLAMYDIA PROBE AMP
CT Probe RNA: NEGATIVE
GC Probe RNA: NEGATIVE

## 2013-02-10 ENCOUNTER — Other Ambulatory Visit: Payer: Self-pay | Admitting: Physician Assistant

## 2013-02-10 MED ORDER — ONDANSETRON HCL 4 MG PO TABS: 4.0000 mg | ORAL_TABLET | Freq: Three times a day (TID) | ORAL | Status: DC | PRN

## 2013-02-15 ENCOUNTER — Ambulatory Visit (INDEPENDENT_AMBULATORY_CARE_PROVIDER_SITE_OTHER): Payer: 59 | Admitting: Gynecology

## 2013-02-15 ENCOUNTER — Encounter: Payer: Self-pay | Admitting: Gynecology

## 2013-02-15 VITALS — BP 128/86

## 2013-02-15 DIAGNOSIS — M79609 Pain in unspecified limb: Secondary | ICD-10-CM

## 2013-02-15 DIAGNOSIS — Z3046 Encounter for surveillance of implantable subdermal contraceptive: Secondary | ICD-10-CM

## 2013-02-15 DIAGNOSIS — Z975 Presence of (intrauterine) contraceptive device: Secondary | ICD-10-CM

## 2013-02-15 DIAGNOSIS — N921 Excessive and frequent menstruation with irregular cycle: Secondary | ICD-10-CM

## 2013-02-15 DIAGNOSIS — N912 Amenorrhea, unspecified: Secondary | ICD-10-CM

## 2013-02-15 MED ORDER — KETOROLAC TROMETHAMINE 60 MG/2ML IM SOLN
60.0000 mg | Freq: Once | INTRAMUSCULAR | Status: AC
  Administered 2013-02-15: 60 mg via INTRAMUSCULAR

## 2013-02-15 NOTE — Progress Notes (Signed)
Patient presented to the office today requesting to have the Nexplanon removed from her right arm. Review of her record indicated she was seen on 11/02/2012 and she was status post Laparoscopic left ovarian cystectomy. Cauterization of endometriotic implants.REMOVAL OF NEXPLANON left arm AND REPLACEMENT OF NEXPLANON DEVICE on right arm.   Attempted removal of subdermal and plan as follows:                         Removal of Implanon/Xplanon  The patient was in the supine position with her right arm at 90 identification of the previously placed Implanon was noted. The site for the incision was to be made Betadine solution was applied as an antiseptic. One percent lidocaine was infiltrated under the implant to keep a close to the skin surface. Countertraction on the proximal end of the implant was made and a longitudinal incision of 2 mm from the tip of the implant towards the elbow was made. Pushing the implant towards the incision was made several times along with placing the curved hemostat and trying to identify and feel for the subdermal implant with no success. The skin edges were reapproximated with 2 interrupted sutures of 3-0 Vicryl suture and a Kerlix wrap was placed for patient to remove tomorrow. The patient will be contacted and will have her return in 2 weeks to remove the implant under ultrasound guidance.  Patient wants to have the subdermal implant removed because now she wants to plan to get pregnant and this is causing her to have heavy periods, nausea and headaches. She had a negative urine pregnancy test today in the office.  [facility] HMD5:41 PMTD@

## 2013-02-16 ENCOUNTER — Telehealth: Payer: Self-pay | Admitting: *Deleted

## 2013-02-16 MED ORDER — FLUCONAZOLE 150 MG PO TABS: 150.0000 mg | ORAL_TABLET | Freq: Once | ORAL | Status: DC

## 2013-02-16 MED ORDER — CEPHALEXIN 500 MG PO CAPS: 500.0000 mg | ORAL_CAPSULE | Freq: Two times a day (BID) | ORAL | Status: DC

## 2013-02-16 NOTE — Telephone Encounter (Signed)
rx sent, pt informed.  

## 2013-02-16 NOTE — Telephone Encounter (Signed)
Pt had OV yesterday, pt said her Rx for Diflucan and antibiotic was going to be e-scibed? No rx noted in office notes. Please advise

## 2013-02-16 NOTE — Telephone Encounter (Signed)
Please call in Keflex 500 mg bid for 5 days  Diflucan 150 mg one PO refll x 1

## 2013-02-17 ENCOUNTER — Other Ambulatory Visit: Payer: Self-pay | Admitting: *Deleted

## 2013-02-17 DIAGNOSIS — Z3049 Encounter for surveillance of other contraceptives: Secondary | ICD-10-CM

## 2013-03-05 ENCOUNTER — Ambulatory Visit (INDEPENDENT_AMBULATORY_CARE_PROVIDER_SITE_OTHER): Payer: 59 | Admitting: Gynecology

## 2013-03-05 ENCOUNTER — Encounter: Payer: Self-pay | Admitting: Gynecology

## 2013-03-05 DIAGNOSIS — S40259A Superficial foreign body of unspecified shoulder, initial encounter: Secondary | ICD-10-CM

## 2013-03-05 DIAGNOSIS — M795 Residual foreign body in soft tissue: Secondary | ICD-10-CM

## 2013-03-05 DIAGNOSIS — Z3046 Encounter for surveillance of implantable subdermal contraceptive: Secondary | ICD-10-CM

## 2013-03-05 MED ORDER — FLUCONAZOLE 150 MG PO TABS: 150.0000 mg | ORAL_TABLET | Freq: Once | ORAL | Status: DC

## 2013-03-05 MED ORDER — OXYCODONE-ACETAMINOPHEN 2.5-325 MG PO TABS: 1.0000 | ORAL_TABLET | ORAL | Status: DC | PRN

## 2013-03-05 MED ORDER — CEPHALEXIN 500 MG PO CAPS: ORAL_CAPSULE | ORAL | Status: DC

## 2013-03-05 NOTE — Progress Notes (Signed)
Patient presented to the office today for removal of her On under ultrasound guidance. She received the office on October 7 and we were not successful.  Procedure note: The patient was in the supine position with her right arm at 90 identification of the previously placed Implanon was noted. The site for the incision was to be made Betadine solution was applied as an antiseptic. One percent lidocaine was infiltrated under the implant to keep a close to the skin surface. Countertraction on the proximal end of the implant was made and a longitudinal incision of 2 mm from the tip of the implant towards the elbow was made. Pushing the implant towards the incision was made several times along with placing the curved hemostat and trying to identify and feel for the subdermal implant with no success. This was undertaken with the aid of the ultrasound to help localize the subdermal implant but we were not successful and retrieving it despite a larger incision.  The edges were reapproximated with interrupted suture of 2-0 Vicryl a gauze and a Kerlix wrap was placed.  The patient will be placed on Keflex 500 mg twice a day for 7 more days. She was given a prescription of Percocet to take one by mouth every 4-6 hours when necessary. She will return back to the office in 10 days to remove the suture. I am going to talk with upper extremity orthopedic surgeon to see if it would be able to retrieve and remove the Nexplanon since the patient would like to get pregnant. I've discussed this with her husband who was present and the patient.

## 2013-03-22 ENCOUNTER — Other Ambulatory Visit: Payer: Self-pay | Admitting: *Deleted

## 2013-03-22 ENCOUNTER — Encounter: Payer: Self-pay | Admitting: Gynecology

## 2013-03-22 MED ORDER — ALPRAZOLAM 0.5 MG PO TABS: 0.5000 mg | ORAL_TABLET | Freq: Every day | ORAL | Status: DC | PRN

## 2013-03-22 NOTE — Telephone Encounter (Signed)
rx called in KW 

## 2013-03-24 ENCOUNTER — Ambulatory Visit (INDEPENDENT_AMBULATORY_CARE_PROVIDER_SITE_OTHER): Payer: 59 | Admitting: Family Medicine

## 2013-03-24 VITALS — BP 118/76 | HR 98 | Temp 98.2°F | Resp 16 | Ht 68.0 in | Wt 208.0 lb

## 2013-03-24 DIAGNOSIS — R059 Cough, unspecified: Secondary | ICD-10-CM

## 2013-03-24 DIAGNOSIS — R05 Cough: Secondary | ICD-10-CM

## 2013-03-24 DIAGNOSIS — F411 Generalized anxiety disorder: Secondary | ICD-10-CM

## 2013-03-24 DIAGNOSIS — J029 Acute pharyngitis, unspecified: Secondary | ICD-10-CM

## 2013-03-24 DIAGNOSIS — J019 Acute sinusitis, unspecified: Secondary | ICD-10-CM

## 2013-03-24 LAB — POCT RAPID STREP A (OFFICE): Rapid Strep A Screen: NEGATIVE

## 2013-03-24 MED ORDER — BENZONATATE 100 MG PO CAPS: 200.0000 mg | ORAL_CAPSULE | Freq: Three times a day (TID) | ORAL | Status: DC | PRN

## 2013-03-24 MED ORDER — AMOXICILLIN-POT CLAVULANATE 875-125 MG PO TABS: 1.0000 | ORAL_TABLET | Freq: Two times a day (BID) | ORAL | Status: DC

## 2013-03-24 MED ORDER — HYDROCODONE-HOMATROPINE 5-1.5 MG/5ML PO SYRP: 5.0000 mL | ORAL_SOLUTION | Freq: Every evening | ORAL | Status: DC | PRN

## 2013-03-24 MED ORDER — FLUTICASONE PROPIONATE 50 MCG/ACT NA SUSP: 2.0000 | Freq: Every day | NASAL | Status: DC

## 2013-03-24 MED ORDER — HYDROXYZINE HCL 10 MG PO TABS: 10.0000 mg | ORAL_TABLET | Freq: Two times a day (BID) | ORAL | Status: DC | PRN

## 2013-03-24 NOTE — Progress Notes (Signed)
[facility]:  Office Visit  Chief Complaint:  Chief Complaint  Patient presents with  . Sore Throat    some PND symptoms x 1 week  . Cough    HPI: Erika Armstrong is a 22 y.o. female who is here for  Sore throat, sinus congestion ,  Yellow/white mucus, raw throat, she has had facial pain. 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 Everyone in her house has it. She denies fevers, chills, msk aches. She ahs not had flu vaccine. She has clogged ears. Has allergies and asthma. She has taken benadryl. She also has asthma. Denies wheezing but there is rumbling in her chest sometimes. She has no rashes, no abd pain, no vomiting.   She has constant nausea from nexplanon, she got it put in in May and is supposed to get it removed 2 days before Thanksgiving.  Hsitory of anxiety was given benzos by ob/gyn  Past Medical History  Diagnosis Date  . Thyroid disease     Hypothyroid - pt states no med - saw 2nd opinion - neg   . Endometriosis   . Ovarian cyst   . Depression   . Allergy   . Asthma     no inhaler - uses nebalizer  . Seasonal allergies   . Bronchitis     history - per pt  . Headache(784.0)     otc med prn  . Arthritis     wrist, knees - otc med prn  . Anxiety     and panic attacks   Past Surgical History  Procedure Laterality Date  . Dermoid cyst  excision      right side  . Diagnostic laparoscopy    . Laparoscopy Left 10/08/2012    Procedure: LAPAROSCOPY OPERATIVE;REMOVAL OF NEXPLANON AND  REPLACEMENT OF NEXPLANON DEVICE;  Surgeon: Ok Edwards, MD;  Location: [facility];  Service: Gynecology;  Laterality: Left;  left upper arm and right upper arm   History   Social History  . Marital Status: Single    Spouse Name: N/A    Number of Children: N/A  . Years of Education: N/A   Social History Main Topics  . Smoking status: Never Smoker   . Smokeless  tobacco: Never Used  . Alcohol Use: No  . Drug Use: No  . Sexual Activity: Yes    Birth Control/ Protection: Implant   Other Topics Concern  . Not on file   Social History Narrative  . No narrative on file   Family History  Problem Relation Age of Onset  . Heart disease Mother   . Hypertension Mother   . Hyperlipidemia Father   . Diabetes Other   . Hypertension Other   . Hypertension Maternal Grandmother   . COPD Maternal Grandmother   . COPD Maternal Grandfather   . Hypertension Paternal Grandmother   . Diabetes Paternal Grandmother   . COPD Paternal Grandfather    Allergies  Allergen Reactions  . Seroquel [Quetiapine Fumerate] Swelling   Prior to Admission medications   Medication Sig Start Date End Date Taking? Authorizing Provider  acetaminophen (TYLENOL) 325 MG tablet Take 650 mg by mouth daily as needed for pain (alternates with ibuprofen daily).   Yes Historical Provider, MD  diphenhydrAMINE (BENADRYL) 25 mg capsule Take 50 mg by mouth 2 (two) times daily as needed. Patient takes for allergies  Yes Historical Provider, MD  albuterol (PROVENTIL) (2.5 MG/3ML) 0.083% nebulizer solution Take 2.5 mg by nebulization daily as needed for wheezing (depends of allergies, not an MDI).    Historical Provider, MD  ALPRAZolam Prudy Feeler) 0.5 MG tablet Take 1 tablet (0.5 mg total) by mouth daily as needed for anxiety. Anxiety 03/22/13   Ok Edwards, MD  HYDROcodone-acetaminophen (NORCO/VICODIN) 5-325 MG per tablet TAKE ONE TABLET BY MOUTH EVERY 6 HOURS AS NEEDED FOR PAIN 10/14/12   Ok Edwards, MD  oxycodone-acetaminophen (PERCOCET) 2.5-325 MG per tablet Take 1 tablet by mouth every 4 (four) hours as needed for pain. 03/05/13   Ok Edwards, MD     ROS: The patient denies fevers, chills, night sweats, unintentional weight loss, chest pain, palpitations, wheezing, dyspnea on exertion, nausea, vomiting, abdominal pain, dysuria, hematuria, melena, numbness, weakness, or  tingling.   All other systems have been reviewed and were otherwise negative with the exception of those mentioned in the HPI and as above.    PHYSICAL EXAM: Filed Vitals:   03/24/13 1334  BP: 118/76  Pulse: 98  Temp: 98.2 F (36.8 C)  Resp: 16   Filed Vitals:   03/24/13 1334  Height: 5\' 8"  (1.727 m)  Weight: 208 lb (94.348 kg)   Body mass index is 31.63 kg/(m^2).  General: Alert, no acute distress HEENT:  Normocephalic, atraumatic, oropharynx patent. EOMI, PERRLA, TM nl, + sinus tenerenss, + boggy nares, right is closed compared to left, + rhinorrhea Cardiovascular:  Regular rate and rhythm, no rubs murmurs or gallops.  No Carotid bruits, radial pulse intact. No pedal edema.  Respiratory: Clear to auscultation bilaterally.  No wheezes, rales, or rhonchi.  No cyanosis, no use of accessory musculature GI: No organomegaly, abdomen is soft and non-tender, positive bowel sounds.  No masses. Skin: No rashes. Neurologic: Facial musculature symmetric. Psychiatric: Patient is appropriate throughout our interaction. Lymphatic: No cervical lymphadenopathy Musculoskeletal: Gait intact.   LABS: Results for orders placed in visit on 03/24/13  POCT RAPID STREP A (OFFICE)      Result Value Range   Rapid Strep A Screen Negative  Negative     EKG/XRAY:   Primary read interpreted by Dr. Conley Rolls at [facility].   ASSESSMENT/PLAN: Encounter Diagnoses  Name Primary?  . Acute pharyngitis Yes  . Acute sinusitis   . Cough   . Generalized anxiety disorder    Rx Augmentin, Tessalon Perles, Hycodan, Vistaril (advise to not take with benadryl if taking vistaril) Work note given for today F/u prn  Gross sideeffects, risk and benefits, and alternatives of medications d/w patient. Patient is aware that all medications have potential sideeffects and we are unable to predict every sideeffect or drug-drug interaction that may occur.  Hamilton Capri PHUONG, DO 03/24/2013 2:45 PM

## 2013-03-24 NOTE — Patient Instructions (Signed)

## 2013-03-26 ENCOUNTER — Other Ambulatory Visit: Payer: Self-pay | Admitting: Orthopedic Surgery

## 2013-03-31 ENCOUNTER — Encounter (HOSPITAL_BASED_OUTPATIENT_CLINIC_OR_DEPARTMENT_OTHER): Payer: Self-pay | Admitting: *Deleted

## 2013-04-01 ENCOUNTER — Telehealth: Payer: Self-pay

## 2013-04-01 MED ORDER — FLUCONAZOLE 150 MG PO TABS: 150.0000 mg | ORAL_TABLET | Freq: Once | ORAL | Status: DC

## 2013-04-01 NOTE — Telephone Encounter (Signed)
Patient was put on amoxicillin by dr Conley Rolls, she now has a yeast infection and would like something called in for that if possible please call patient at [phone]

## 2013-04-01 NOTE — Telephone Encounter (Signed)
Diflucan sent in/ per protocol called patient to advise.

## 2013-04-03 ENCOUNTER — Ambulatory Visit (INDEPENDENT_AMBULATORY_CARE_PROVIDER_SITE_OTHER): Payer: 59 | Admitting: Family Medicine

## 2013-04-03 ENCOUNTER — Ambulatory Visit: Payer: 59

## 2013-04-03 VITALS — BP 118/72 | HR 89 | Temp 99.0°F | Resp 16 | Ht 68.0 in | Wt 208.0 lb

## 2013-04-03 DIAGNOSIS — J45909 Unspecified asthma, uncomplicated: Secondary | ICD-10-CM

## 2013-04-03 DIAGNOSIS — R059 Cough, unspecified: Secondary | ICD-10-CM

## 2013-04-03 DIAGNOSIS — K120 Recurrent oral aphthae: Secondary | ICD-10-CM

## 2013-04-03 DIAGNOSIS — R05 Cough: Secondary | ICD-10-CM

## 2013-04-03 DIAGNOSIS — J069 Acute upper respiratory infection, unspecified: Secondary | ICD-10-CM

## 2013-04-03 DIAGNOSIS — R0989 Other specified symptoms and signs involving the circulatory and respiratory systems: Secondary | ICD-10-CM

## 2013-04-03 LAB — POCT CBC
Granulocyte percent: 65.3 %G (ref 37–80)
HCT, POC: 46.8 % (ref 37.7–47.9)
Hemoglobin: 15.1 g/dL (ref 12.2–16.2)
Lymph, poc: 3 (ref 0.6–3.4)
MCH, POC: 30.8 pg (ref 27–31.2)
MCHC: 32.3 g/dL (ref 31.8–35.4)
MCV: 95.5 fL (ref 80–97)
MID (cbc): 0.8 (ref 0–0.9)
MPV: 9.3 fL (ref 0–99.8)
POC Granulocyte: 7.2 — AB (ref 2–6.9)
POC LYMPH PERCENT: 27.3 %L (ref 10–50)
POC MID %: 7.4 % (ref 0–12)
Platelet Count, POC: 254 10*3/uL (ref 142–424)
RBC: 4.9 M/uL (ref 4.04–5.48)
RDW, POC: 13.2 %
WBC: 11 10*3/uL — AB (ref 4.6–10.2)

## 2013-04-03 MED ORDER — ALBUTEROL SULFATE (2.5 MG/3ML) 0.083% IN NEBU: 2.5000 mg | INHALATION_SOLUTION | Freq: Four times a day (QID) | RESPIRATORY_TRACT | Status: DC | PRN

## 2013-04-03 MED ORDER — ACYCLOVIR 5 % EX OINT: 1.0000 "application " | TOPICAL_OINTMENT | Freq: Four times a day (QID) | CUTANEOUS | Status: DC | PRN

## 2013-04-03 MED ORDER — LEVOFLOXACIN 500 MG PO TABS: 500.0000 mg | ORAL_TABLET | Freq: Every day | ORAL | Status: DC

## 2013-04-03 MED ORDER — FLUCONAZOLE 150 MG PO TABS: 150.0000 mg | ORAL_TABLET | Freq: Once | ORAL | Status: DC

## 2013-04-03 MED ORDER — METHYLPREDNISOLONE 4 MG PO KIT: PACK | ORAL | Status: DC

## 2013-04-03 NOTE — Progress Notes (Signed)
 [facility]:  Office Visit  Chief Complaint:  Chief Complaint  Patient presents with  . Nasal Congestion    Last seen on 12th still currently on abx. feels no better   . Chest Pain  . Cough    HPI: Erika Armstrong is a 22 y.o. female who is here for  Chest congestion and still not feeling better after treated for sinus and bronchitis sxs She has a history fo asthma , has had to use albuterol more frequently, admits to some wheezing but done . She was on augmentin for the last 10 days. hpi from last visit below  Erika Armstrong is a 22 y.o. female who is here for Sore throat, sinus congestion , Yellow/white mucus, raw throat, she has had facial pain. 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 Everyone in her house has it. She denies fevers, chills, msk aches. She ahs not had flu vaccine. She has clogged ears. Has allergies and asthma. She has taken benadryl. She also has asthma. Denies wheezing but there is rumbling in her chest sometimes. She has no rashes, no abd pain, no vomiting.  She has constant nausea from nexplanon, she got it put in in May and is supposed to get it removed 2 days before Thanksgiving.  Hsitory of anxiety was given benzos by ob/gyn   Past Medical History  Diagnosis Date  . Thyroid disease     Hypothyroid - pt states no med - saw 2nd opinion - neg   . Endometriosis   . Ovarian cyst   . Depression   . Allergy   . Asthma     no inhaler - uses nebalizer  . Seasonal allergies   . Bronchitis     history - per pt  . Headache(784.0)     otc med prn  . Arthritis     wrist, knees - otc med prn  . Anxiety     and panic attacks   Past Surgical History  Procedure Laterality Date  . Dermoid cyst  excision      right side  . Diagnostic laparoscopy    . Laparoscopy Left 10/08/2012    Procedure: LAPAROSCOPY OPERATIVE;REMOVAL OF NEXPLANON AND  REPLACEMENT  OF NEXPLANON DEVICE;  Surgeon: Ok Edwards, MD;  Location: [facility];  Service: Gynecology;  Laterality: Left;  left upper arm and right upper arm   History   Social History  . Marital Status: Single    Spouse Name: N/A    Number of Children: N/A  . Years of Education: N/A   Social History Main Topics  . Smoking status: Never Smoker   . Smokeless tobacco: Never Used  . Alcohol Use: Yes     Comment: social  . Drug Use: No  . Sexual Activity: Yes    Birth Control/ Protection: Implant   Other Topics Concern  . None   Social History Narrative  . None   Family History  Problem Relation Age of Onset  . Heart disease Mother   . Hypertension Mother   . Hyperlipidemia Father   . Diabetes Other   . Hypertension Other   . Hypertension Maternal Grandmother   . COPD Maternal Grandmother   . COPD Maternal Grandfather   . Hypertension Paternal Grandmother   . Diabetes Paternal Grandmother   . COPD Paternal Grandfather    Allergies  Allergen Reactions  . Seroquel [Quetiapine Fumerate] Swelling   Prior to Admission medications   Medication Sig Start Date End Date Taking? Authorizing Provider  acetaminophen (TYLENOL) 325 MG tablet Take 650 mg by mouth daily as needed for pain (alternates with ibuprofen daily).   Yes Historical Provider, MD  albuterol (PROVENTIL) (2.5 MG/3ML) 0.083% nebulizer solution Take 2.5 mg by nebulization daily as needed for wheezing (depends of allergies, not an MDI).   Yes Historical Provider, MD  ALPRAZolam Prudy Feeler) 0.5 MG tablet Take 1 tablet (0.5 mg total) by mouth daily as needed for anxiety. Anxiety 03/22/13  Yes Ok Edwards, MD  amoxicillin-clavulanate (AUGMENTIN) 875-125 MG per tablet Take 1 tablet by mouth 2 (two) times daily. 03/24/13  Yes  P , DO  diphenhydrAMINE (BENADRYL) 25 mg capsule Take 50 mg by mouth 2 (two) times daily as needed. Patient takes for allergies   Yes Historical Provider, MD  fluconazole (DIFLUCAN) 150 MG tablet Take 1  tablet (150 mg total) by mouth once. Repeat if needed 04/01/13  Yes Chelle S Jeffery, PA-C  fluticasone (FLONASE) 50 MCG/ACT nasal spray Place 2 sprays into both nostrils daily. 03/24/13  Yes  P , DO  HYDROcodone-homatropine (HYCODAN) 5-1.5 MG/5ML syrup Take 5 mLs by mouth at bedtime as needed for cough. 03/24/13  Yes  P , DO     ROS: The patient denies night sweats, unintentional weight loss, chest pain, palpitations, wheezing, dyspnea on exertion, nausea, vomiting, abdominal pain, dysuria, hematuria, melena, numbness, weakness, or tingling.   All other systems have been reviewed and were otherwise negative with the exception of those mentioned in the HPI and as above.    PHYSICAL EXAM: Filed Vitals:   04/03/13 1822  BP: 118/72  Pulse: 89  Temp: 99 F (37.2 C)  Resp: 16   Filed Vitals:   04/03/13 1822  Height: 5\' 8"  (1.727 m)  Weight: 208 lb (94.348 kg)   Body mass index is 31.63 kg/(m^2).  General: Alert, no acute distress HEENT:  Normocephalic, atraumatic, oropharynx patent. EOMI, PERRLA, + sinus tenderness,  tm nl, no exudates Cardiovascular:  Regular rate and rhythm, no rubs murmurs or gallops.  No Carotid bruits, radial pulse intact. No pedal edema.  Respiratory: Clear to auscultation bilaterally.  No wheezes, rales, or rhonchi.  No cyanosis, no use of accessory musculature GI: No organomegaly, abdomen is soft and non-tender, positive bowel sounds.  No masses. Skin: No rashes. Neurologic: Facial musculature symmetric. Psychiatric: Patient is appropriate throughout our interaction. Lymphatic: No cervical lymphadenopathy Musculoskeletal: Gait intact.   LABS: Results for orders placed in visit on 04/03/13  POCT CBC      Result Value Range   WBC 11.0 (*) 4.6 - 10.2 K/uL   Lymph, poc 3.0  0.6 - 3.4   POC LYMPH PERCENT 27.3  10 - 50 %L   MID (cbc) 0.8  0 - 0.9   POC MID % 7.4  0 - 12 %M   POC Granulocyte 7.2 (*) 2 - 6.9   Granulocyte percent 65.3  37 - 80  %G   RBC 4.90  4.04 - 5.48 M/uL   Hemoglobin 15.1  12.2 - 16.2 g/dL   HCT, POC 84.6  96.2 - 47.9 %   MCV 95.5  80 - 97 fL   MCH, POC 30.8  27 - 31.2 pg   MCHC 32.3  31.8 - 35.4 g/dL   RDW, POC 95.2     Platelet Count, POC 254  142 - 424  K/uL   MPV 9.3  0 - 99.8 fL     EKG/XRAY:   Primary read interpreted by Dr. Conley Rolls at [facility]. Increase vascular markings    ASSESSMENT/PLAN: Encounter Diagnoses  Name Primary?  . Cough Yes  . Chest congestion   . URI, acute   . Asthma, chronic   . Recurrent canker sores    Rx Levaquin, albuterol, fluconazole, and acyclovir ointment F/u prn  Gross sideeffects, risk and benefits, and alternatives of medications d/w patient. Patient is aware that all medications have potential sideeffects and we are unable to predict every sideeffect or drug-drug interaction that may occur.  Hamilton Capri PHUONG, DO 04/03/2013 7:56 PM

## 2013-04-05 ENCOUNTER — Encounter (HOSPITAL_BASED_OUTPATIENT_CLINIC_OR_DEPARTMENT_OTHER): Payer: Self-pay | Admitting: *Deleted

## 2013-04-05 NOTE — H&P (Signed)
Erika Armstrong is an 22 y.o. female.   Chief Complaint: c/o pain right brachium.  Need to explant Nexplanon hormone implant. HPI:  She had an implant placed more than three months ago and it is time to have this removed.  An office procedure to attempt removal was technically challenging.  Due to the multiple named structures in the region of the implant she is referred for an upper extremity orthopaedic consult.      Past Medical History  Diagnosis Date  . Thyroid disease     Hypothyroid - pt states no med - saw 2nd opinion - neg   . Endometriosis   . Ovarian cyst   . Allergy   . Asthma     no inhaler - uses nebalizer  . Seasonal allergies   . Bronchitis     history - per pt  . Headache(784.0)     otc med prn  . Arthritis     wrist, knees - otc med prn  . Anxiety     and panic attacks  . Depression     Past Surgical History  Procedure Laterality Date  . Dermoid cyst  excision      right side  . Diagnostic laparoscopy    . Laparoscopy Left 10/08/2012    Procedure: LAPAROSCOPY OPERATIVE;REMOVAL OF NEXPLANON AND  REPLACEMENT OF NEXPLANON DEVICE;  Surgeon: Ok Edwards, MD;  Location: [facility];  Service: Gynecology;  Laterality: Left;  left upper arm and right upper arm    Family History  Problem Relation Age of Onset  . Heart disease Mother   . Hypertension Mother   . Hyperlipidemia Father   . Diabetes Other   . Hypertension Other   . Hypertension Maternal Grandmother   . COPD Maternal Grandmother   . COPD Maternal Grandfather   . Hypertension Paternal Grandmother   . Diabetes Paternal Grandmother   . COPD Paternal Grandfather    Social History:  reports that she has never smoked. She has never used smokeless tobacco. She reports that she drinks alcohol. She reports that she does not use illicit drugs.  Allergies:  Allergies  Allergen Reactions  . Seroquel [Quetiapine Fumerate] Swelling    No prescriptions prior to admission    Results for orders placed in  visit on 04/03/13 (from the past 48 hour(s))  POCT CBC     Status: Abnormal   Collection Time    04/03/13  7:28 PM      Result Value Range   WBC 11.0 (*) 4.6 - 10.2 K/uL   Lymph, poc 3.0  0.6 - 3.4   POC LYMPH PERCENT 27.3  10 - 50 %L   MID (cbc) 0.8  0 - 0.9   POC MID % 7.4  0 - 12 %M   POC Granulocyte 7.2 (*) 2 - 6.9   Granulocyte percent 65.3  37 - 80 %G   RBC 4.90  4.04 - 5.48 M/uL   Hemoglobin 15.1  12.2 - 16.2 g/dL   HCT, POC 09.8  11.9 - 47.9 %   MCV 95.5  80 - 97 fL   MCH, POC 30.8  27 - 31.2 pg   MCHC 32.3  31.8 - 35.4 g/dL   RDW, POC 14.7     Platelet Count, POC 254  142 - 424 K/uL   MPV 9.3  0 - 99.8 fL    Dg Chest 2 View  04/03/2013   CLINICAL DATA:  Cough, congestion.  EXAM: CHEST  2  VIEW  COMPARISON:  None  FINDINGS: The heart size and mediastinal contours are within normal limits. Both lungs are clear. The visualized skeletal structures are unremarkable.  IMPRESSION: No active cardiopulmonary disease.   Electronically Signed   By: Charlett Nose M.D.   On: 04/03/2013 20:14     Pertinent items are noted in HPI.  Height 5\' 8"  (1.727 m), weight 94.348 kg (208 lb).  General appearance: alert Head: Normocephalic, without obvious abnormality Neck: supple, symmetrical, trachea midline Resp: clear to auscultation bilaterally Cardio: regular rate and rhythm GI: normal findings: bowel sounds normal Extremities:   Inspection of her arm reveals a sutured laceration with Vicryl sutures in place.  She has no palpable mass.  She has some discomfort near her medial epicondyle which is likely due to absorbing hematoma.  I could not identify evidence of significant flexor pronator tendinopathy.    X-ray of her arm AP and lateral locates the implant a few centimeters proximal to her incision. This is lying obliquely posterior distally.    Pulses: 2+ and symmetric Skin: normal Neurologic: Grossly normal    Assessment/Plan Impression::  Retained progesterone implant close to  neurovascular structures right brachium. There is a moderate chance that bi plane x-ray will be needed to safely locate the implant.  Plan:To the OR for removal progesterone implant right upper extermity.The procedure, risks,benefits and post-op course were discussed with the patient at length and they were in agreement with the plan.  DASNOIT,Erika Armstrong J 04/05/2013, 2:11 PM  H&P documentation: 04/06/2013  -History and Physical Reviewed  -Patient has been re-examined  -No change in the plan of care  Wyn Forster, MD

## 2013-04-06 ENCOUNTER — Encounter (HOSPITAL_BASED_OUTPATIENT_CLINIC_OR_DEPARTMENT_OTHER): Payer: 59 | Admitting: Anesthesiology

## 2013-04-06 ENCOUNTER — Telehealth: Payer: Self-pay | Admitting: *Deleted

## 2013-04-06 ENCOUNTER — Ambulatory Visit (HOSPITAL_BASED_OUTPATIENT_CLINIC_OR_DEPARTMENT_OTHER)
Admission: RE | Admit: 2013-04-06 | Discharge: 2013-04-06 | Disposition: A | Discharge status: Home / Self Care | Payer: 59 | Source: Ambulatory Visit | Attending: Orthopedic Surgery | Admitting: Orthopedic Surgery

## 2013-04-06 ENCOUNTER — Ambulatory Visit (HOSPITAL_BASED_OUTPATIENT_CLINIC_OR_DEPARTMENT_OTHER): Payer: 59 | Admitting: Anesthesiology

## 2013-04-06 ENCOUNTER — Encounter (HOSPITAL_BASED_OUTPATIENT_CLINIC_OR_DEPARTMENT_OTHER): Payer: Self-pay | Admitting: Anesthesiology

## 2013-04-06 ENCOUNTER — Encounter (HOSPITAL_BASED_OUTPATIENT_CLINIC_OR_DEPARTMENT_OTHER)
Admission: RE | Disposition: A | Discharge status: Home / Self Care | Payer: Self-pay | Source: Ambulatory Visit | Attending: Orthopedic Surgery

## 2013-04-06 DIAGNOSIS — J45909 Unspecified asthma, uncomplicated: Secondary | ICD-10-CM | POA: Insufficient documentation

## 2013-04-06 DIAGNOSIS — Z3046 Encounter for surveillance of implantable subdermal contraceptive: Secondary | ICD-10-CM | POA: Insufficient documentation

## 2013-04-06 DIAGNOSIS — E039 Hypothyroidism, unspecified: Secondary | ICD-10-CM | POA: Insufficient documentation

## 2013-04-06 DIAGNOSIS — F3289 Other specified depressive episodes: Secondary | ICD-10-CM | POA: Insufficient documentation

## 2013-04-06 DIAGNOSIS — N809 Endometriosis, unspecified: Secondary | ICD-10-CM | POA: Insufficient documentation

## 2013-04-06 DIAGNOSIS — F329 Major depressive disorder, single episode, unspecified: Secondary | ICD-10-CM | POA: Insufficient documentation

## 2013-04-06 DIAGNOSIS — F411 Generalized anxiety disorder: Secondary | ICD-10-CM | POA: Insufficient documentation

## 2013-04-06 HISTORY — PX: I & D EXTREMITY: SHX5045

## 2013-04-06 SURGERY — IRRIGATION AND DEBRIDEMENT EXTREMITY
Anesthesia: General | Site: Arm Upper | Laterality: Right | Wound class: Clean

## 2013-04-06 MED ORDER — LIDOCAINE HCL 2 % IJ SOLN
INTRAMUSCULAR | Status: AC
  Filled 2013-04-06: qty 20

## 2013-04-06 MED ORDER — MIDAZOLAM HCL 2 MG/2ML IJ SOLN: 1.0000 mg | INTRAMUSCULAR | Status: DC | PRN

## 2013-04-06 MED ORDER — LIDOCAINE HCL (CARDIAC) 20 MG/ML IV SOLN
INTRAVENOUS | Status: DC | PRN
  Administered 2013-04-06: 100 mg via INTRAVENOUS

## 2013-04-06 MED ORDER — LIDOCAINE HCL 2 % IJ SOLN
INTRAMUSCULAR | Status: DC | PRN
  Administered 2013-04-06: 2 mL

## 2013-04-06 MED ORDER — FENTANYL CITRATE 0.05 MG/ML IJ SOLN: 25.0000 ug | INTRAMUSCULAR | Status: DC | PRN

## 2013-04-06 MED ORDER — OXYCODONE HCL 5 MG PO TABS
ORAL_TABLET | ORAL | Status: AC
  Filled 2013-04-06: qty 1

## 2013-04-06 MED ORDER — MIDAZOLAM HCL 2 MG/ML PO SYRP: 0.5000 mg/kg | ORAL_SOLUTION | Freq: Once | ORAL | Status: DC | PRN

## 2013-04-06 MED ORDER — FENTANYL CITRATE 0.05 MG/ML IJ SOLN
INTRAMUSCULAR | Status: AC
  Filled 2013-04-06: qty 4

## 2013-04-06 MED ORDER — ACETAMINOPHEN-CODEINE #3 300-30 MG PO TABS: 1.0000 | ORAL_TABLET | ORAL | Status: DC | PRN

## 2013-04-06 MED ORDER — PROPOFOL 10 MG/ML IV BOLUS
INTRAVENOUS | Status: DC | PRN
  Administered 2013-04-06: 200 mg via INTRAVENOUS

## 2013-04-06 MED ORDER — CHLORHEXIDINE GLUCONATE 4 % EX LIQD: 60.0000 mL | Freq: Once | CUTANEOUS | Status: DC

## 2013-04-06 MED ORDER — FENTANYL CITRATE 0.05 MG/ML IJ SOLN: 50.0000 ug | INTRAMUSCULAR | Status: DC | PRN

## 2013-04-06 MED ORDER — 0.9 % SODIUM CHLORIDE (POUR BTL) OPTIME
TOPICAL | Status: DC | PRN
  Administered 2013-04-06: 200 mL

## 2013-04-06 MED ORDER — OXYCODONE HCL 5 MG PO TABS
5.0000 mg | ORAL_TABLET | Freq: Once | ORAL | Status: AC | PRN
  Administered 2013-04-06: 5 mg via ORAL

## 2013-04-06 MED ORDER — PROPOFOL 10 MG/ML IV BOLUS
INTRAVENOUS | Status: AC
  Filled 2013-04-06: qty 20

## 2013-04-06 MED ORDER — FENTANYL CITRATE 0.05 MG/ML IJ SOLN
INTRAMUSCULAR | Status: DC | PRN
  Administered 2013-04-06 (×2): 50 ug via INTRAVENOUS
  Administered 2013-04-06: 100 ug via INTRAVENOUS

## 2013-04-06 MED ORDER — MIDAZOLAM HCL 2 MG/2ML IJ SOLN
INTRAMUSCULAR | Status: AC
  Filled 2013-04-06: qty 2

## 2013-04-06 MED ORDER — CEFAZOLIN SODIUM-DEXTROSE 2-3 GM-% IV SOLR
INTRAVENOUS | Status: DC | PRN
  Administered 2013-04-06: 2 g via INTRAVENOUS

## 2013-04-06 MED ORDER — CEFAZOLIN SODIUM-DEXTROSE 2-3 GM-% IV SOLR
INTRAVENOUS | Status: AC
  Filled 2013-04-06: qty 50

## 2013-04-06 MED ORDER — OXYCODONE HCL 5 MG/5ML PO SOLN: 5.0000 mg | Freq: Once | ORAL | Status: AC | PRN

## 2013-04-06 MED ORDER — DEXAMETHASONE SODIUM PHOSPHATE 10 MG/ML IJ SOLN
INTRAMUSCULAR | Status: DC | PRN
  Administered 2013-04-06: 10 mg via INTRAVENOUS

## 2013-04-06 MED ORDER — MIDAZOLAM HCL 5 MG/5ML IJ SOLN
INTRAMUSCULAR | Status: DC | PRN
  Administered 2013-04-06: 2 mg via INTRAVENOUS

## 2013-04-06 MED ORDER — METOCLOPRAMIDE HCL 5 MG/ML IJ SOLN: 10.0000 mg | Freq: Once | INTRAMUSCULAR | Status: DC | PRN

## 2013-04-06 MED ORDER — LACTATED RINGERS IV SOLN
INTRAVENOUS | Status: DC
  Administered 2013-04-06 (×2): via INTRAVENOUS

## 2013-04-06 SURGICAL SUPPLY — 64 items
BAG DECANTER FOR FLEXI CONT (MISCELLANEOUS) IMPLANT
BANDAGE ADHESIVE 1X3 (GAUZE/BANDAGES/DRESSINGS) IMPLANT
BANDAGE COBAN STERILE 2 (GAUZE/BANDAGES/DRESSINGS) IMPLANT
BANDAGE ELASTIC 3 VELCRO ST LF (GAUZE/BANDAGES/DRESSINGS) ×1 IMPLANT
BANDAGE GAUZE ELAST BULKY 4 IN (GAUZE/BANDAGES/DRESSINGS) IMPLANT
BLADE MINI RND TIP GREEN BEAV (BLADE) IMPLANT
BLADE SURG 15 STRL LF DISP TIS (BLADE) ×1 IMPLANT
BLADE SURG 15 STRL SS (BLADE) ×2
BNDG CMPR 9X4 STRL LF SNTH (GAUZE/BANDAGES/DRESSINGS) ×1
BNDG CMPR MD 5X2 ELC HKLP STRL (GAUZE/BANDAGES/DRESSINGS)
BNDG COHESIVE 1X5 TAN STRL LF (GAUZE/BANDAGES/DRESSINGS) IMPLANT
BNDG COHESIVE 3X5 TAN STRL LF (GAUZE/BANDAGES/DRESSINGS) IMPLANT
BNDG ELASTIC 2 VLCR STRL LF (GAUZE/BANDAGES/DRESSINGS) IMPLANT
BNDG ESMARK 4X9 LF (GAUZE/BANDAGES/DRESSINGS) ×1 IMPLANT
BRUSH SCRUB EZ PLAIN DRY (MISCELLANEOUS) ×2 IMPLANT
CORDS BIPOLAR (ELECTRODE) ×2 IMPLANT
COVER MAYO STAND STRL (DRAPES) ×2 IMPLANT
COVER TABLE BACK 60X90 (DRAPES) ×2 IMPLANT
CUFF TOURNIQUET SINGLE 18IN (TOURNIQUET CUFF) ×1 IMPLANT
DECANTER SPIKE VIAL GLASS SM (MISCELLANEOUS) IMPLANT
DRAIN PENROSE 1/2X12 LTX STRL (WOUND CARE) IMPLANT
DRAIN PENROSE 1/4X12 LTX STRL (WOUND CARE) IMPLANT
DRAPE EXTREMITY T 121X128X90 (DRAPE) ×2 IMPLANT
DRAPE SURG 17X23 STRL (DRAPES) ×2 IMPLANT
DRSG EMULSION OIL 3X3 NADH (GAUZE/BANDAGES/DRESSINGS) IMPLANT
DRSG PAD ABDOMINAL 8X10 ST (GAUZE/BANDAGES/DRESSINGS) IMPLANT
DRSG TEGADERM 4X4.75 (GAUZE/BANDAGES/DRESSINGS) ×1 IMPLANT
GAUZE PACKING IODOFORM 1/4X5 (PACKING) IMPLANT
GAUZE XEROFORM 1X8 LF (GAUZE/BANDAGES/DRESSINGS) IMPLANT
GLOVE BIOGEL M STRL SZ7.5 (GLOVE) ×1 IMPLANT
GLOVE BIOGEL PI IND STRL 7.0 (GLOVE) IMPLANT
GLOVE BIOGEL PI IND STRL 8 (GLOVE) ×1 IMPLANT
GLOVE BIOGEL PI INDICATOR 7.0 (GLOVE) ×1
GLOVE BIOGEL PI INDICATOR 8 (GLOVE)
GLOVE ECLIPSE 6.5 STRL STRAW (GLOVE) ×1 IMPLANT
GLOVE EXAM NITRILE MD LF STRL (GLOVE) ×2 IMPLANT
GLOVE ORTHO TXT STRL SZ7.5 (GLOVE) ×2 IMPLANT
GOWN BRE IMP PREV XXLGXLNG (GOWN DISPOSABLE) ×3 IMPLANT
GOWN PREVENTION PLUS XLARGE (GOWN DISPOSABLE) ×2 IMPLANT
LOOP VESSEL MINI RED (MISCELLANEOUS) IMPLANT
NEEDLE 27GAX1X1/2 (NEEDLE) ×1 IMPLANT
NS IRRIG 1000ML POUR BTL (IV SOLUTION) ×2 IMPLANT
PACK BASIN DAY SURGERY FS (CUSTOM PROCEDURE TRAY) ×2 IMPLANT
PADDING CAST ABS 3INX4YD NS (CAST SUPPLIES)
PADDING CAST ABS 4INX4YD NS (CAST SUPPLIES)
PADDING CAST ABS COTTON 3X4 (CAST SUPPLIES) IMPLANT
PADDING CAST ABS COTTON 4X4 ST (CAST SUPPLIES) ×1 IMPLANT
SLEEVE SURGEON STRL (DRAPES) IMPLANT
SPONGE GAUZE 4X4 12PLY (GAUZE/BANDAGES/DRESSINGS) ×2 IMPLANT
STOCKINETTE 4X48 STRL (DRAPES) ×2 IMPLANT
STRIP CLOSURE SKIN 1/2X4 (GAUZE/BANDAGES/DRESSINGS) IMPLANT
SUT ETHILON 5 0 P 3 18 (SUTURE)
SUT ETHILON 5 0 PS 2 18 (SUTURE) IMPLANT
SUT NYLON ETHILON 5-0 P-3 1X18 (SUTURE) IMPLANT
SUT PROLENE 3 0 PS 2 (SUTURE) ×1 IMPLANT
SWAB COLLECTION DEVICE MRSA (MISCELLANEOUS) IMPLANT
SYR 3ML 23GX1 SAFETY (SYRINGE) IMPLANT
SYR BULB 3OZ (MISCELLANEOUS) ×2 IMPLANT
SYR CONTROL 10ML LL (SYRINGE) ×1 IMPLANT
TOWEL OR 17X24 6PK STRL BLUE (TOWEL DISPOSABLE) ×2 IMPLANT
TRAY DSU PREP LF (CUSTOM PROCEDURE TRAY) ×3 IMPLANT
TUBE ANAEROBIC SPECIMEN COL (MISCELLANEOUS) IMPLANT
TUBE FEEDING 5FR 15 INCH (TUBING) IMPLANT
UNDERPAD 30X30 INCONTINENT (UNDERPADS AND DIAPERS) ×2 IMPLANT

## 2013-04-06 NOTE — Transfer of Care (Signed)
Immediate Anesthesia Transfer of Care Note  Patient: Erika Armstrong  Procedure(s) Performed: Procedure(s): REMOVAL OF IMPLANT RIGHT ARM--Implant given to patient. (Right)  Patient Location: PACU  Anesthesia Type:General  Level of Consciousness: sedated  Airway & Oxygen Therapy: Patient Spontanous Breathing and Patient connected to face mask oxygen  Post-op Assessment: Report given to PACU RN and Post -op Vital signs reviewed and stable  Post vital signs: Reviewed and stable  Complications: No apparent anesthesia complications

## 2013-04-06 NOTE — Telephone Encounter (Signed)
Please call in a prescription for Lomedia 24Fe # 1 pack with 11 refills. She can start today. It is generic.

## 2013-04-06 NOTE — Telephone Encounter (Signed)
Pt had Nexplanon removed today, pt said you spoke with her about starting on a hormone pill to help balance her cycles? Pt has dysfunctional uterine bleeding. OV? Please advise

## 2013-04-06 NOTE — Brief Op Note (Signed)
04/06/2013  8:21 AM  PATIENT:  Erika Armstrong  22 y.o. female  PRE-OPERATIVE DIAGNOSIS:  IMPLANT RIGHT BRACHIUM  POST-OPERATIVE DIAGNOSIS:  IMPLANT RIGHT BRACHIUM  PROCEDURE:  Procedure(s): REMOVAL OF IMPLANT RIGHT ARM--Implant given to patient. (Right)  SURGEON:  Surgeon(s) and Role:    * Wyn Forster., MD - Primary  PHYSICIAN ASSISTANT:   ASSISTANTS: registered nurse  ANESTHESIA:   general  EBL:  Total I/O In: 800 [I.V.:800] Out: -   BLOOD ADMINISTERED:none  DRAINS: none   LOCAL MEDICATIONS USED:  XYLOCAINE   SPECIMEN:  No Specimen  DISPOSITION OF SPECIMEN:  N/A  COUNTS:  YES  TOURNIQUET:   Total Tourniquet Time Documented: Upper Arm (Right) - 11 minutes Total: Upper Arm (Right) - 11 minutes   DICTATION: .Other Dictation: Dictation Number [id number]  PLAN OF CARE: Discharge to home after PACU  PATIENT DISPOSITION:  PACU - hemodynamically stable.   Delay start of Pharmacological VTE agent (>24hrs) due to surgical blood loss or risk of bleeding: not applicable

## 2013-04-06 NOTE — Op Note (Signed)
[id number] 

## 2013-04-06 NOTE — Anesthesia Preprocedure Evaluation (Addendum)
Anesthesia Evaluation  Patient identified by MRN, date of birth, ID band Patient awake    Reviewed: Allergy & Precautions, H&P , NPO status , Patient's Chart, lab work & pertinent test results, reviewed documented beta blocker date and time   History of Anesthesia Complications Negative for: history of anesthetic complications  Airway Mallampati: II TM Distance: >3 FB Neck ROM: full    Dental   Pulmonary asthma ,  breath sounds clear to auscultation        Cardiovascular negative cardio ROS  Rhythm:regular     Neuro/Psych  Headaches, PSYCHIATRIC DISORDERS Anxiety Depression    GI/Hepatic negative GI ROS, Neg liver ROS,   Endo/Other  negative endocrine ROS  Renal/GU negative Renal ROS  negative genitourinary   Musculoskeletal   Abdominal   Peds  Hematology negative hematology ROS (+)   Anesthesia Other Findings See surgeon's H&P   Reproductive/Obstetrics negative OB ROS                           Anesthesia Physical Anesthesia Plan  ASA: II  Anesthesia Plan: General   Post-op Pain Management:    Induction: Intravenous  Airway Management Planned: LMA  Additional Equipment:   Intra-op Plan:   Post-operative Plan:   Informed Consent: I have reviewed the patients History and Physical, chart, labs and discussed the procedure including the risks, benefits and alternatives for the proposed anesthesia with the patient or authorized representative who has indicated his/her understanding and acceptance.   Dental Advisory Given  Plan Discussed with: CRNA and Surgeon  Anesthesia Plan Comments:         Anesthesia Quick Evaluation

## 2013-04-06 NOTE — Anesthesia Postprocedure Evaluation (Signed)
Anesthesia Post Note  Patient: Erika Armstrong  Procedure(s) Performed: Procedure(s) (LRB): REMOVAL OF IMPLANT RIGHT ARM--Implant given to patient. (Right)  Anesthesia type: General  Patient location: PACU  Post pain: Pain level controlled  Post assessment: Patient's Cardiovascular Status Stable  Last Vitals:  Filed Vitals:   04/06/13 0845  BP: 118/72  Pulse: 75  Temp:   Resp: 16    Post vital signs: Reviewed and stable  Level of consciousness: alert  Complications: No apparent anesthesia complications

## 2013-04-06 NOTE — Op Note (Signed)
NAMECHELSY, PARRALES                 ACCOUNT NO.:  [id number]  MEDICAL RECORD NO.:  [id number]  LOCATION: [place]                            FACILITY: [facility]    PHYSICIAN:  Katy Fitch. Donyetta Ogletree, M.D. DATE OF BIRTH:  1990-06-25  DATE OF PROCEDURE:  04/06/2013 DATE OF DISCHARGE:  04/06/2013                              OPERATIVE REPORT   PREOPERATIVE DIAGNOSIS:  Retained progesterone implant right medial brachium with unsuccessful attempted office removal times two.  POSTOPERATIVE DIAGNOSIS:  Retained progesterone implant right medial brachium with unsuccessful attempted office removal times two.  OPERATION:  Explant of retained progesterone implant, right brachium.  OPERATING SURGEON:  Katy Fitch. Sharvi Mooneyhan, M.D.  ASSISTANT:  Registered nurse.  ANESTHESIA:  General by LMA.  SUPERVISING ANESTHESIOLOGIST:  Dr. Gelene Mink.  INDICATIONS:  Berlyn Saylor is a 22 year old woman referred through the courtesy of Dr. Reynaldo Minium, OB/GYN physician for elective removal of a retained progesterone implant in the right brachium.  Office removal efforts have been unsuccessful due to the position of the implant that was deep in the brachium and possibly adjacent to neurovascular structures.  Dr. Lily Peer referred Ms. Dunn for an orthopedic evaluation and x-rays revealed the location of the implant.  Arrangements were made for elective removal at this time.  Preoperatively, Ms. Shea Evans was advised the potential risks and benefits of surgery.  She understood that we might need to use a fluoroscope for localization in the OR if palpation was unsuccessful under general anesthesia.  PROCEDURE:  Evalise Abruzzese was interviewed by Dr. Gelene Mink and after detailed anesthesia and informed consent, selected general anesthesia by LMA technique.  In room 2 of the [facility] under Dr. Thornton Dales direct supervision, general anesthesia by LMA technique was induced followed by routine Betadine  scrub with paint of the right upper extremity.  A pneumatic tourniquet was applied to the proximal right brachium.  Following exsanguination of the right arm with Esmarch bandage, the arterial tourniquet on the proximal brachium was inflated to 220 mmHg.  Routine surgical time-out was accomplished.  Ancef was administered as a perioperative prophylactic antibiotic.  Procedure commenced with an elliptical resection of the scar created in the office.  Subcutaneous tissues were carefully divided.  There was considerable subcutaneous fat.  I carefully palpated through the incision and ultimately found the tip of the implant proximally and posterior.  I was then able to dissect the fat and used a [employer] forceps to gain control of the implant and explanted it without complication.  The wound was then inspected for bleeding points followed by layered closure with subcutaneous 4-0 Vicryl and intradermal 3-0 Prolene with Steri-Strips.  Lidocaine 2% was infiltrated along the wound margins for postoperative comfort.  The wound was then dressed with sterile gauze and Tegaderm followed by Ace wrap.  There were no apparent complications.     Katy Fitch Twisha Vanpelt, M.D.     RVS/MEDQ  D:  04/06/2013  T:  04/06/2013  Job:  [id number]  cc:   Gaetano Hawthorne. Lily Peer, M.D.

## 2013-04-07 ENCOUNTER — Encounter (HOSPITAL_BASED_OUTPATIENT_CLINIC_OR_DEPARTMENT_OTHER): Payer: Self-pay | Admitting: Orthopedic Surgery

## 2013-04-07 NOTE — Telephone Encounter (Signed)
Left message for pt to call.

## 2013-04-23 ENCOUNTER — Telehealth: Payer: Self-pay

## 2013-04-23 NOTE — Telephone Encounter (Signed)
Pt called and reported she had tightness of chest and wheezing d/t asthma and used 1 neb tx of albuterol. Pt asked whether she can use a 2nd tx. She reports Sxs improved after 1st tx but still has some wheezing. I advised her to come in to be seen because she is also reporting coughing up some yellow mucus and was seen for similar Sxs on 04/03/13. Pt stated that she got better after last OV but worse again last three days. She has lost her ins and does not want to have ot pay OOP for an OV. I discussed w/Sarah who got on the phone and spoke to pt.

## 2013-04-23 NOTE — Telephone Encounter (Signed)
I spoke with pt who was able to speak in very long sentences and did not sound SOB on the phone.  I explained to her that because she was using her Albuterol neb every 4 h even during the night over the last 3 days that she needs to be recheck and possible needs either oral or inhaled steroids.  She was at work and did not feel as though she could make it before we closed.  I suggested that if things got worse to go to the ED tonight and she should be seen tomorrow here in the clinic if she did not end up in the ED.  She was to continue her albuterol neb as close to every 4h as should could but if she could not speak in full sentences she could use more often but she might experience increased HR as a result.  Pt voiced understanding.  I told her about our cash pay policy and that we would be able to set up a payment plan if she would prefer.

## 2013-04-26 ENCOUNTER — Ambulatory Visit: Payer: 59 | Admitting: Physician Assistant

## 2013-04-26 VITALS — BP 110/76 | HR 95 | Temp 97.7°F | Resp 18 | Wt 210.0 lb

## 2013-04-26 DIAGNOSIS — J209 Acute bronchitis, unspecified: Secondary | ICD-10-CM

## 2013-04-26 DIAGNOSIS — J3081 Allergic rhinitis due to animal (cat) (dog) hair and dander: Secondary | ICD-10-CM

## 2013-04-26 DIAGNOSIS — R0602 Shortness of breath: Secondary | ICD-10-CM

## 2013-04-26 DIAGNOSIS — J3089 Other allergic rhinitis: Secondary | ICD-10-CM

## 2013-04-26 DIAGNOSIS — J45909 Unspecified asthma, uncomplicated: Secondary | ICD-10-CM | POA: Insufficient documentation

## 2013-04-26 MED ORDER — MOMETASONE FUROATE 220 MCG/INH IN AEPB: 2.0000 | INHALATION_SPRAY | Freq: Every day | RESPIRATORY_TRACT | Status: DC

## 2013-04-26 MED ORDER — IPRATROPIUM BROMIDE 0.02 % IN SOLN
0.5000 mg | Freq: Once | RESPIRATORY_TRACT | Status: AC
  Administered 2013-04-26: 0.5 mg via RESPIRATORY_TRACT

## 2013-04-26 MED ORDER — PREDNISONE 10 MG PO TABS: ORAL_TABLET | ORAL | Status: AC

## 2013-04-26 MED ORDER — AZITHROMYCIN 250 MG PO TABS: ORAL_TABLET | ORAL | Status: AC

## 2013-04-26 MED ORDER — GUAIFENESIN ER 1200 MG PO TB12: 1.0000 | ORAL_TABLET | Freq: Two times a day (BID) | ORAL | Status: AC

## 2013-04-26 MED ORDER — ALBUTEROL SULFATE (2.5 MG/3ML) 0.083% IN NEBU
2.5000 mg | INHALATION_SOLUTION | Freq: Once | RESPIRATORY_TRACT | Status: AC
  Administered 2013-04-26: 2.5 mg via RESPIRATORY_TRACT

## 2013-04-26 MED ORDER — ALBUTEROL SULFATE (2.5 MG/3ML) 0.083% IN NEBU: 2.5000 mg | INHALATION_SOLUTION | Freq: Four times a day (QID) | RESPIRATORY_TRACT | Status: DC | PRN

## 2013-04-26 MED ORDER — CETIRIZINE HCL 10 MG PO TABS: 10.0000 mg | ORAL_TABLET | Freq: Every day | ORAL | Status: DC

## 2013-04-26 NOTE — Progress Notes (Signed)
Subjective:    Patient ID: Erika Armstrong, female    DOB: Sep 06, 1990, 22 y.o.   MRN: [id number]  HPI Pt presents to clinic with continued SOB - after her last visit she felt like she got better and then 1 week ago she started to feel SOB again and started having to use her neb with albuterol every 4 hrs - it is waking her up from sleep - she is not coughing until she feels SOB.  She does not feel like she gets full relief from her neb and feels like she could use it more often. She does have some yellow sputum with her cough at times.  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  In the past she has been on allergy immunotherapy but was unable to continue because she had trouble with them.  She has had lifelong asthma and in the past has been on Advair and singulair.  She plans to have insurance at the beginning of the year.  She currently does not have anxiety.  She did have her Nexplanon taken out in November - and they are planning on trying to get pregnant.  She took a U preg this am and it was neg.  She has not yet had her period since her nexplanon was taken out.  She does not feel sick.  OTC -  Review of Systems  HENT: Positive for congestion and rhinorrhea.   Respiratory: Positive for cough (yellow), shortness of breath and wheezing.        Objective:   Physical Exam  Vitals reviewed. Constitutional: She appears well-developed and well-nourished.  HENT:  Head: Normocephalic and atraumatic.  Right Ear: Hearing, tympanic membrane, external ear and ear canal normal.  Left Ear: Hearing, tympanic membrane, external ear and ear canal normal.  Nose: Nose normal.  Mouth/Throat: Uvula is midline, oropharynx is clear and moist and mucous membranes are normal.  Cardiovascular: Normal rate, regular rhythm and normal heart sounds.   No murmur heard. Pulmonary/Chest: Effort normal.  Pt has wheezing end expiration  that seems forced - upon auscultation she has minimal air movement at lung bases -- after her neb she feels "better than she has in about 1 week" and she has wheezing inspiratory < expiratory worse at the bases on forced expiration.    Pt given Albuterol/atrovent neb - peak flow - 425, 425 - pt feels better    Assessment & Plan:  Asthma, chronic - Plan: mometasone (ASMANEX 60 METERED DOSES) 220 MCG/INH inhaler, albuterol (PROVENTIL) (2.5 MG/3ML) 0.083% nebulizer solution, predniSONE (DELTASONE) 10 MG tablet  SOB (shortness of breath) - Plan: albuterol (PROVENTIL) (2.5 MG/3ML) 0.083% nebulizer solution 2.5 mg, ipratropium (ATROVENT) nebulizer solution 0.5 mg  Cat allergies - Plan: cetirizine (ZYRTEC) 10 MG tablet  Acute bronchitis - Plan: Guaifenesin (MUCINEX MAXIMUM STRENGTH) 1200 MG TB12, azithromycin (ZITHROMAX Z-PAK) 250 MG tablet  Pt has uncontrolled asthma with under-treated allergies - we will attempt to decrease her exposure to known allergens over the next several days to see if we can get the inflammation in her lungs improved.  We will treat a possible a typical bacterial bronchitis with zithromax.  We will start oral prednisone while the asmanex starts to work - I gave her 3 weeks of samples that will last her until she gets insurance at the 1st of the year.  At the  1st of the year we will consider restart of singulair.  She will call me in 1 week to let me know how much albuterol she is requiring and then she will recheck after being on the inhaled steroid for 1 month.  She understands and agrees with the above plan.  When she gets pregnant we may need to revisit her medications.  Benny Lennert PA-C 04/26/2013 4:33 PM

## 2013-04-27 NOTE — Progress Notes (Signed)
Sent message to Benny Lennert regarding scheduling appointment for patient in one month.  Sarah's next available appointment is not until 06/2013.

## 2013-04-29 NOTE — Progress Notes (Signed)
Patient made appointment for 06/30/13 @ 1:45 pm to recheck with Benny Lennert.

## 2013-06-09 ENCOUNTER — Ambulatory Visit (INDEPENDENT_AMBULATORY_CARE_PROVIDER_SITE_OTHER): Payer: BC Managed Care – PPO | Admitting: Physician Assistant

## 2013-06-09 VITALS — BP 130/70 | HR 83 | Temp 98.0°F | Resp 16 | Ht 68.0 in | Wt 210.0 lb

## 2013-06-09 DIAGNOSIS — J029 Acute pharyngitis, unspecified: Secondary | ICD-10-CM

## 2013-06-09 LAB — POCT RAPID STREP A (OFFICE): RAPID STREP A SCREEN: NEGATIVE

## 2013-06-09 MED ORDER — AMOXICILLIN 875 MG PO TABS: 875.0000 mg | ORAL_TABLET | Freq: Two times a day (BID) | ORAL | Status: DC

## 2013-06-09 MED ORDER — FLUCONAZOLE 150 MG PO TABS: 150.0000 mg | ORAL_TABLET | Freq: Once | ORAL | Status: DC

## 2013-06-09 NOTE — Patient Instructions (Signed)
Get plenty of rest and drink at least 64 ounces of water daily. Use OTC ibuprofen and/or acetaminophen as needed for throat pain.

## 2013-06-09 NOTE — Progress Notes (Signed)
   Subjective:    Patient ID: Erika Armstrong, female    DOB: 11-06-1990, 23 y.o.   MRN: [id number]  HPI  Patient presents to clinic with sore throat starting 06/02/13.  Noticed swollen tonsils more on the right side. Reports pain with swallowing, ear pain bilaterally and headache.  Denies nasal congestion, post nasal drip, cough, sinus pressure, fever and chills. Has not tried any medication for throat pain.  Has hx of asthma and allergies.   Review of Systems  Respiratory: Negative for shortness of breath and wheezing.   Cardiovascular: Negative for chest pain and palpitations.  Gastrointestinal: Negative for nausea and constipation.  Genitourinary: Negative for dysuria and hematuria.  Musculoskeletal: Negative for arthralgias and myalgias.  Neurological: Negative for dizziness and light-headedness.      Objective:   Physical Exam  Constitutional: She is oriented to person, place, and time. She appears well-developed and well-nourished.  HENT:  Head: Normocephalic and atraumatic.  Right Ear: Tympanic membrane, external ear and ear canal normal.  Left Ear: Tympanic membrane, external ear and ear canal normal.  Nose: Rhinorrhea present. No mucosal edema.  Mouth/Throat: Uvula is midline and mucous membranes are normal. Posterior oropharyngeal erythema present. No oropharyngeal exudate, posterior oropharyngeal edema or tonsillar abscesses.  Right tonsillar enlargement  Eyes: Conjunctivae are normal.  Neck: Neck supple.  Cardiovascular: Normal rate, regular rhythm and normal heart sounds.   Pulmonary/Chest: Effort normal and breath sounds normal. She has no wheezes.  Lymphadenopathy:       Head (right side): No submental, no submandibular, no tonsillar, no preauricular and no posterior auricular adenopathy present.       Head (left side): No submental, no submandibular, no tonsillar, no preauricular and no posterior auricular adenopathy present.    She has no cervical adenopathy.       Right: No supraclavicular adenopathy present.       Left: No supraclavicular adenopathy present.  Neurological: She is alert and oriented to person, place, and time.  Skin: Skin is warm and dry.  Psychiatric: She has a normal mood and affect. Her behavior is normal. Judgment and thought content normal.   Results for orders placed in visit on 06/09/13  POCT RAPID STREP A (OFFICE)      Result Value Range   Rapid Strep A Screen Negative  Negative        Assessment & Plan:    1. Acute pharyngitis Rapid strep was negative. Will call with results of TC. Treating sore throat with amoxicillin due to duration of symptoms. Diflucan given because patient develops yeast infections when she takes antibiotics. May continue ibuprofen and/or tylenol for throat pain. Rest and push fluids.  - POCT rapid strep A - Culture, Group A Strep - amoxicillin (AMOXIL) 875 MG tablet; Take 1 tablet (875 mg total) by mouth 2 (two) times daily.  Dispense: 20 tablet; Refill: 0 - fluconazole (DIFLUCAN) 150 MG tablet; Take 1 tablet (150 mg total) by mouth once. Repeat if needed  Dispense: 2 tablet; Refill: 0

## 2013-06-10 NOTE — Progress Notes (Signed)
I have examined this patient along with the student and agree.  

## 2013-06-11 LAB — CULTURE, GROUP A STREP: ORGANISM ID, BACTERIA: NORMAL

## 2013-06-18 ENCOUNTER — Telehealth: Payer: Self-pay | Admitting: *Deleted

## 2013-06-18 NOTE — Telephone Encounter (Signed)
Per JF patient will need to be seen in office if no cycle in 2-3 months. Pt is trying to conceive, she will follow up if needed.

## 2013-06-18 NOTE — Telephone Encounter (Signed)
Pt had Implanon removed on 03/05/13, pt states her cycle has not started this month yet, LMP:05/14/13. She states this will be only her 2 period since having Implanon removed, took UPT and it was negative.I explained to pt that this could be related to having implant removed. Pt asked how long should she wait if her cycle doesn't start? Please advise

## 2013-06-30 ENCOUNTER — Ambulatory Visit: Payer: 59 | Admitting: Physician Assistant

## 2013-07-30 ENCOUNTER — Other Ambulatory Visit: Payer: Self-pay | Admitting: Physician Assistant

## 2013-08-19 ENCOUNTER — Ambulatory Visit (INDEPENDENT_AMBULATORY_CARE_PROVIDER_SITE_OTHER): Payer: BC Managed Care – PPO | Admitting: Gynecology

## 2013-08-19 ENCOUNTER — Other Ambulatory Visit: Payer: Self-pay | Admitting: Gynecology

## 2013-08-19 ENCOUNTER — Encounter: Payer: Self-pay | Admitting: Gynecology

## 2013-08-19 ENCOUNTER — Ambulatory Visit (INDEPENDENT_AMBULATORY_CARE_PROVIDER_SITE_OTHER): Payer: BC Managed Care – PPO

## 2013-08-19 ENCOUNTER — Telehealth: Payer: Self-pay

## 2013-08-19 VITALS — BP 126/82

## 2013-08-19 DIAGNOSIS — N930 Postcoital and contact bleeding: Secondary | ICD-10-CM

## 2013-08-19 DIAGNOSIS — Z8742 Personal history of other diseases of the female genital tract: Secondary | ICD-10-CM

## 2013-08-19 DIAGNOSIS — N915 Oligomenorrhea, unspecified: Secondary | ICD-10-CM

## 2013-08-19 DIAGNOSIS — N839 Noninflammatory disorder of ovary, fallopian tube and broad ligament, unspecified: Secondary | ICD-10-CM

## 2013-08-19 LAB — PREGNANCY, URINE: PREG TEST UR: NEGATIVE

## 2013-08-19 MED ORDER — CLOMIPHENE CITRATE 50 MG PO TABS: 100.0000 mg | ORAL_TABLET | Freq: Every day | ORAL | Status: DC

## 2013-08-19 NOTE — Patient Instructions (Signed)
Clomiphene tablets What is this medicine? CLOMIPHENE (KLOE mi feen) is a fertility drug that increases the chance of pregnancy. It helps women ovulate (produce a mature egg) during their cycle. This medicine may be used for other purposes; ask your health care provider or pharmacist if you have questions. COMMON BRAND NAME(S): Clomid, Serophene What should I tell my health care provider before I take this medicine? They need to know if you have any of these conditions: -adrenal gland disease -blood vessel disease or blood clots -cyst on the ovary -endometriosis -liver disease -ovarian cancer -pituitary gland disease -vaginal bleeding that has not been evaluated -an unusual or allergic reaction to clomiphene, other medicines, foods, dyes, or preservatives -pregnant (should not be used if you are already pregnant) -breast-feeding How should I use this medicine? Take this medicine by mouth with a glass of water. Follow the directions on the prescription label. Take exactly as directed for the exact number of days prescribed. Take your doses at regular intervals. Most women take this medicine for a 5 day period, but the length of treatment may be adjusted. Your doctor will give you a start date for this medication and will give you instructions on proper use. Do not take your medicine more often than directed. Talk to your pediatrician regarding the use of this medicine in children. Special care may be needed. Overdosage: If you think you have taken too much of this medicine contact a poison control center or emergency room at once. NOTE: This medicine is only for you. Do not share this medicine with others. What if I miss a dose? If you miss a dose, take it as soon as you can. If it is almost time for your next dose, take only that dose. Do not take double or extra doses. What may interact with this medicine? -herbal or dietary supplements, like blue cohosh, black cohosh, chasteberry, or  DHEA -prasterone This list may not describe all possible interactions. Give your health care provider a list of all the medicines, herbs, non-prescription drugs, or dietary supplements you use. Also tell them if you smoke, drink alcohol, or use illegal drugs. Some items may interact with your medicine. What should I watch for while using this medicine? Make sure you understand how and when to use this medicine. You need to know when you are ovulating and when to have sexual intercourse. This will increase the chance of a pregnancy. Visit your doctor or health care professional for regular checks on your progress. You may need tests to check the hormone levels in your blood or you may have to use home-urine tests to check for ovulation. Try to keep any appointments. Compared to other fertility treatments, this medicine does not greatly increase your chances of having multiple babies. An increased chance of having twins may occur in roughly 5 out of every 100 women who take this medication. Stop taking this medicine at once and contact your doctor or health care professional if you think you are pregnant. This medicine is not for long-term use. Most women that benefit from this medicine do so within the first three cycles (months). Your doctor or health care professional will monitor your condition. This medicine is usually used for a total of 6 cycles of treatment. You may get drowsy or dizzy. Do not drive, use machinery, or do anything that needs mental alertness until you know how this drug affects you. Do not stand or sit up quickly. This reduces the risk of dizzy or   fainting spells. Drinking alcoholic beverages or smoking tobacco may decrease your chance of becoming pregnant. Limit or stop alcohol and tobacco use during your fertility treatments. What side effects may I notice from receiving this medicine? Side effects that you should report to your doctor or health care professional as soon as  possible: -allergic reactions like skin rash, itching or hives, swelling of the face, lips, or tongue -breathing problems -changes in vision -fluid retention -nausea, vomiting -pelvic pain or bloating -severe abdominal pain -sudden weight gain Side effects that usually do not require medical attention (report to your doctor or health care professional if they continue or are bothersome): -breast discomfort -hot flashes -mild pelvic discomfort -mild nausea This list may not describe all possible side effects. Call your doctor for medical advice about side effects. You may report side effects to FDA at 1-800-FDA-1088. Where should I keep my medicine? Keep out of the reach of children. Store at room temperature between 15 and 30 degrees C (59 and 86 degrees F). Protect from heat, light, and moisture. Throw away any unused medicine after the expiration date. NOTE: This sheet is a summary. It may not cover all possible information. If you have questions about this medicine, talk to your doctor, pharmacist, or health care provider.  2014, [employer] Standard. (2007-08-10 22:21:06)

## 2013-08-19 NOTE — Telephone Encounter (Signed)
Patient called reporting heavy bleeding with clots last night after intercourse.  This morning nothing on pad but when she goes to restroom passing clots and blood.  Patient concerned.    Of note, before I could return her call Butch Penny let me know she had called the appt desk and scheduled for 12pm today and no longer needed me to call her back.  I would have recommended an office visit.

## 2013-08-19 NOTE — Addendum Note (Signed)
Addended by: Su Grand A on: 08/19/2013 03:09 PM   Modules accepted: Orders

## 2013-08-19 NOTE — Addendum Note (Signed)
Addended by: Terrance Mass on: 08/19/2013 01:09 PM   Modules accepted: Level of Service

## 2013-08-19 NOTE — Progress Notes (Signed)
   Patient is a 23 year old who had her Nexplanon removed in November 2014 surgically because it was embedded deeply in her subcutaneous tissue. It was removed by Dr. Daylene Katayama. Patient is here today because of irregular cycles some time she's got up to 45 days without a menstrual cycle but in January February and March she had normal 7 day cycles but this cycle was 21 days from previous and began bleeding after having intercourse. Review of her record indicated that she has a history of a left ovarian cyst that was noted last year which was avascular and measured 56 x 40 x 15 mm. The patient began complaining of some lower bowel twinges as well.  Exam: Abdomen: Slight tenderness lower abdomen no rebound or guarding Pelvic: And urethra Skene was within normal limits Vagina: Menstrual blood present Cervix: No lesions slight menstrual flow noted Uterus: Anteverted Questionable fullness left adnexa Rectal exam: Not done  Urine pregnancy test: Negative   Ultrasound today: Uterus measured 8.0 x 4.9 x 3.0 cm with endometrial stripe 7.4 mm. Prominent solid endometrial cavity avascular. Right ovary with tubal avascular cystic area 22 x 15 x 13 mm suspicious for hydrosalpinx. Left ovary located posterior to the uterus was reported and normal in appearance.  Assessment/plan: Patient with history of oligomenorrhea/irregular cycles and slightly overweight. We discussed ovulation induction medication to synchronized her cycles as well as for timing of intercourse. We discussed clomiphene citrate 100 mg day 5 through 9 and timing of her intercourse and a 12-16. We'll try this for 3 months if not successful conceiving she will return back to the office with a plan on an HSG as well as semen analysis. She was instructed to take her prenatal vitamins daily. The risks benefits pros and cons of ovulation induction medication were discussed include hyperstimulation had multiple gestation.

## 2013-09-15 ENCOUNTER — Encounter: Payer: BC Managed Care – PPO | Admitting: Gynecology

## 2013-09-23 ENCOUNTER — Other Ambulatory Visit: Payer: Self-pay | Admitting: Women's Health

## 2013-09-23 ENCOUNTER — Ambulatory Visit (INDEPENDENT_AMBULATORY_CARE_PROVIDER_SITE_OTHER): Payer: BC Managed Care – PPO | Admitting: Women's Health

## 2013-09-23 ENCOUNTER — Telehealth: Payer: Self-pay | Admitting: *Deleted

## 2013-09-23 ENCOUNTER — Encounter: Payer: Self-pay | Admitting: Women's Health

## 2013-09-23 DIAGNOSIS — Z975 Presence of (intrauterine) contraceptive device: Secondary | ICD-10-CM

## 2013-09-23 DIAGNOSIS — N898 Other specified noninflammatory disorders of vagina: Secondary | ICD-10-CM

## 2013-09-23 DIAGNOSIS — N926 Irregular menstruation, unspecified: Secondary | ICD-10-CM

## 2013-09-23 DIAGNOSIS — D271 Benign neoplasm of left ovary: Secondary | ICD-10-CM

## 2013-09-23 DIAGNOSIS — D279 Benign neoplasm of unspecified ovary: Secondary | ICD-10-CM

## 2013-09-23 DIAGNOSIS — N920 Excessive and frequent menstruation with regular cycle: Secondary | ICD-10-CM

## 2013-09-23 LAB — WET PREP FOR TRICH, YEAST, CLUE
CLUE CELLS WET PREP: NONE SEEN
Trich, Wet Prep: NONE SEEN
WBC, Wet Prep HPF POC: NONE SEEN
Yeast Wet Prep HPF POC: NONE SEEN

## 2013-09-23 LAB — PROLACTIN: Prolactin: 7.5 ng/mL

## 2013-09-23 LAB — TSH: TSH: 4.435 u[IU]/mL (ref 0.350–4.500)

## 2013-09-23 LAB — HCG, SERUM, QUALITATIVE: Preg, Serum: POSITIVE

## 2013-09-23 NOTE — Patient Instructions (Signed)

## 2013-09-23 NOTE — Progress Notes (Signed)
Patient ID: Erika Armstrong, female   DOB: 1990-07-22, 23 y.o.   MRN: [id number] Presents with unusual cycle. Desiring conception, history of irregular cycles, on second cycle of Clomid 100 day 5 through 9 per Dr. Toney Rakes. Cycle started normal  time lasted for 3 days stopped, clear mucous, and spotting red to brown blood since. Day 10 of cycle today.  Nexplanon removed 03/2013. Denies vaginal discharge, urinary symptoms, or fever. Having slight left-sided abdominal discomfort and low back ache. History of dermoid cysts x2, first 2012 in [place].  09/2012 dermoid cyst removed and endometriosis treated by Dr. Toney Rakes.  Exam: Appears well. External genitalia within normal limits, speculum exam scant menstrual period blood, wet prep negative. Bimanual uterus is small nontender slight tenderness on left lower abdomen.  DUB Desiring conception Endometriosis  Plan: HCG qualitative, TSH, prolactin.  Will watch spotting at this time, instructed to call if bleeding became heavy or spotting persists.

## 2013-09-23 NOTE — Telephone Encounter (Signed)
Pt called c/o lower back pain with nausea and mucus brown discharge taking clomid, I advised pt OV best. Transferred to front desk

## 2013-09-24 ENCOUNTER — Telehealth: Payer: Self-pay | Admitting: *Deleted

## 2013-09-24 DIAGNOSIS — N920 Excessive and frequent menstruation with regular cycle: Secondary | ICD-10-CM

## 2013-09-24 NOTE — Telephone Encounter (Signed)
Pt aware order placed on Monday, pt will make appointment for follow up on tuesday

## 2013-09-24 NOTE — Telephone Encounter (Signed)
Pt saw nancy young yesterday due to spotting (see note) finished clomid as directed. Positive Hcg serum level (09/23/13) pt is still having spotting bright red wearing panty liner now, had slightly more spotting this am but now light. Do you want OV with u/s scheduled today?

## 2013-09-24 NOTE — Telephone Encounter (Signed)
"  Room to repeat the quantitative beta-hCG on the day and to followup on Tuesday with Izora Gala or me." should this be repeat quantitative beta-hcg today and follow up on Tuesday with you or nancy?

## 2013-09-24 NOTE — Telephone Encounter (Signed)
I reviewed in Nancy's notes. Patient had a qualitative pregnancy test today which was positive. I asked the lab to run a quantitative beta hCG. Room to repeat the quantitative beta-hCG on the day and to followup on Tuesday with Izora Gala or me. She needs to rest and stay off her feet the rest of the weekend. She should not engage in any strenuous activity or intercourse. Spotting is not unusual early in pregnancy. If she begins to bleed like a menstrual period she needs to call back and we will see her before that if needed. I am on call this weekend.

## 2013-09-24 NOTE — Telephone Encounter (Signed)
She had blood drawn today and I alerted lab to run a quantitative bhcg on that sample. She should come by on Monday for repeat and office appointment on Tuesday.

## 2013-09-25 LAB — HCG, QUANTITATIVE, PREGNANCY: hCG, Beta Chain, Quant, S: 73.7 m[IU]/mL

## 2013-09-26 ENCOUNTER — Encounter: Payer: Self-pay | Admitting: Women's Health

## 2013-09-27 ENCOUNTER — Other Ambulatory Visit: Payer: BC Managed Care – PPO

## 2013-09-27 DIAGNOSIS — N920 Excessive and frequent menstruation with regular cycle: Secondary | ICD-10-CM

## 2013-09-28 ENCOUNTER — Ambulatory Visit (INDEPENDENT_AMBULATORY_CARE_PROVIDER_SITE_OTHER): Payer: BC Managed Care – PPO | Admitting: Women's Health

## 2013-09-28 DIAGNOSIS — O039 Complete or unspecified spontaneous abortion without complication: Secondary | ICD-10-CM

## 2013-09-28 LAB — HCG, QUANTITATIVE, PREGNANCY: hCG, Beta Chain, Quant, S: 55.8 m[IU]/mL

## 2013-09-28 NOTE — Progress Notes (Signed)
Patient ID: Erika Armstrong, female   DOB: January 17, 1991, 23 y.o.   MRN: [id number] Presents for followup, had unusual lighter than normal cycle last week, then heavy bleeding with cramping. Bleeding stopped. Quant 73- 09/23/2013, 4 days later Quant 55. Desiring pregnancy, on clomid, 3rd month.  Taking prenatal vitamin daily. History of endometriosis. Denies bleeding, abdominal pain, fever, urinary symptoms or discharge.  Exam: Appears well. O+ blood type.  Miscarriage  Plan: Repeat Quant in one week, condolences given. Avoid pregnancy for 2 cycles, return to office with missed cycle , continue prenatal vitamins daily, safe pregnancy behaviors reviewed.

## 2013-09-28 NOTE — Patient Instructions (Signed)
Miscarriage A miscarriage is the sudden loss of an unborn baby (fetus) before the 20th week of pregnancy. Most miscarriages happen in the first 3 months of pregnancy. Sometimes, it happens before a woman even knows she is pregnant. A miscarriage is also called a "spontaneous miscarriage" or "early pregnancy loss." Having a miscarriage can be an emotional experience. Talk with your caregiver about any questions you may have about miscarrying, the grieving process, and your future pregnancy plans. CAUSES   Problems with the fetal chromosomes that make it impossible for the baby to develop normally. Problems with the baby's genes or chromosomes are most often the result of errors that occur, by chance, as the embryo divides and grows. The problems are not inherited from the parents.  Infection of the cervix or uterus.   Hormone problems.   Problems with the cervix, such as having an incompetent cervix. This is when the tissue in the cervix is not strong enough to hold the pregnancy.   Problems with the uterus, such as an abnormally shaped uterus, uterine fibroids, or congenital abnormalities.   Certain medical conditions.   Smoking, drinking alcohol, or taking illegal drugs.   Trauma.  Often, the cause of a miscarriage is unknown.  SYMPTOMS   Vaginal bleeding or spotting, with or without cramps or pain.  Pain or cramping in the abdomen or lower back.  Passing fluid, tissue, or blood clots from the vagina. DIAGNOSIS  Your caregiver will perform a physical exam. You may also have an ultrasound to confirm the miscarriage. Blood or urine tests may also be ordered. TREATMENT   Sometimes, treatment is not necessary if you naturally pass all the fetal tissue that was in the uterus. If some of the fetus or placenta remains in the body (incomplete miscarriage), tissue left behind may become infected and must be removed. Usually, a dilation and curettage (D and C) procedure is performed.  During a D and C procedure, the cervix is widened (dilated) and any remaining fetal or placental tissue is gently removed from the uterus.  Antibiotic medicines are prescribed if there is an infection. Other medicines may be given to reduce the size of the uterus (contract) if there is a lot of bleeding.  If you have Rh negative blood and your baby was Rh positive, you will need a Rh immunoglobulin shot. This shot will protect any future baby from having Rh blood problems in future pregnancies. HOME CARE INSTRUCTIONS   Your caregiver may order bed rest or may allow you to continue light activity. Resume activity as directed by your caregiver.  Have someone help with home and family responsibilities during this time.   Keep track of the number of sanitary pads you use each day and how soaked (saturated) they are. Write down this information.   Do not use tampons. Do not douche or have sexual intercourse until approved by your caregiver.   Only take over-the-counter or prescription medicines for pain or discomfort as directed by your caregiver.   Do not take aspirin. Aspirin can cause bleeding.   Keep all follow-up appointments with your caregiver.   If you or your partner have problems with grieving, talk to your caregiver or seek counseling to help cope with the pregnancy loss. Allow enough time to grieve before trying to get pregnant again.  SEEK IMMEDIATE MEDICAL CARE IF:   You have severe cramps or pain in your back or abdomen.  You have a fever.  You pass large blood clots (walnut-sized   or larger) ortissue from your vagina. Save any tissue for your caregiver to inspect.   Your bleeding increases.   You have a thick, bad-smelling vaginal discharge.  You become lightheaded, weak, or you faint.   You have chills.  MAKE SURE YOU:  Understand these instructions.  Will watch your condition.  Will get help right away if you are not doing well or get  worse. Document Released: 10/23/2000 Document Revised: 08/24/2012 Document Reviewed: 06/18/2011 [employer] Patient Information 2014 [employer].  

## 2013-09-29 ENCOUNTER — Telehealth: Payer: Self-pay | Admitting: *Deleted

## 2013-09-29 ENCOUNTER — Encounter: Payer: Self-pay | Admitting: Women's Health

## 2013-09-29 ENCOUNTER — Encounter: Payer: Self-pay | Admitting: *Deleted

## 2013-09-29 NOTE — Telephone Encounter (Signed)
Noted faxed to [phone], pt aware this has been done.

## 2013-09-29 NOTE — Telephone Encounter (Signed)
Please do so, she can return, was not aware she was out, she had a miscarriage.  Erika Armstrong

## 2013-09-29 NOTE — Telephone Encounter (Signed)
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 (see OV 09/28/13) Okay to type note?

## 2013-10-01 ENCOUNTER — Inpatient Hospital Stay (HOSPITAL_COMMUNITY): Payer: BC Managed Care – PPO

## 2013-10-01 ENCOUNTER — Encounter (HOSPITAL_COMMUNITY): Payer: Self-pay

## 2013-10-01 ENCOUNTER — Encounter: Payer: Self-pay | Admitting: Women's Health

## 2013-10-01 ENCOUNTER — Inpatient Hospital Stay (HOSPITAL_COMMUNITY)
Admission: AD | Admit: 2013-10-01 | Discharge: 2013-10-01 | Disposition: A | Discharge status: Home / Self Care | Payer: BC Managed Care – PPO | Source: Ambulatory Visit | Attending: Obstetrics and Gynecology | Admitting: Obstetrics and Gynecology

## 2013-10-01 DIAGNOSIS — R102 Pelvic and perineal pain: Secondary | ICD-10-CM

## 2013-10-01 DIAGNOSIS — N83209 Unspecified ovarian cyst, unspecified side: Secondary | ICD-10-CM

## 2013-10-01 DIAGNOSIS — M545 Low back pain, unspecified: Secondary | ICD-10-CM | POA: Insufficient documentation

## 2013-10-01 DIAGNOSIS — R109 Unspecified abdominal pain: Secondary | ICD-10-CM | POA: Insufficient documentation

## 2013-10-01 DIAGNOSIS — N949 Unspecified condition associated with female genital organs and menstrual cycle: Secondary | ICD-10-CM | POA: Insufficient documentation

## 2013-10-01 DIAGNOSIS — O039 Complete or unspecified spontaneous abortion without complication: Secondary | ICD-10-CM | POA: Insufficient documentation

## 2013-10-01 LAB — URINALYSIS, ROUTINE W REFLEX MICROSCOPIC
Bilirubin Urine: NEGATIVE
GLUCOSE, UA: NEGATIVE mg/dL
Ketones, ur: NEGATIVE mg/dL
Leukocytes, UA: NEGATIVE
NITRITE: NEGATIVE
PH: 6 (ref 5.0–8.0)
Protein, ur: NEGATIVE mg/dL
Urobilinogen, UA: 0.2 mg/dL (ref 0.0–1.0)

## 2013-10-01 LAB — COMPREHENSIVE METABOLIC PANEL
ALT: 8 U/L (ref 0–35)
AST: 18 U/L (ref 0–37)
Albumin: 3.7 g/dL (ref 3.5–5.2)
Alkaline Phosphatase: 138 U/L — ABNORMAL HIGH (ref 39–117)
BUN: 11 mg/dL (ref 6–23)
CO2: 24 mEq/L (ref 19–32)
Calcium: 9.2 mg/dL (ref 8.4–10.5)
Chloride: 103 mEq/L (ref 96–112)
Creatinine, Ser: 0.67 mg/dL (ref 0.50–1.10)
GFR calc Af Amer: >90 mL/min (ref >90)
GFR calc non Af Amer: >90 mL/min (ref >90)
Glucose, Bld: 92 mg/dL (ref 70–99)
Potassium: 4.4 mEq/L (ref 3.7–5.3)
SODIUM: 140 meq/L (ref 137–147)
TOTAL PROTEIN: 6.8 g/dL (ref 6.0–8.3)
Total Bilirubin: 0.2 mg/dL — ABNORMAL LOW (ref 0.3–1.2)

## 2013-10-01 LAB — CBC
HCT: 40.5 % (ref 36.0–46.0)
HEMOGLOBIN: 13.9 g/dL (ref 12.0–15.0)
MCH: 31 pg (ref 26.0–34.0)
MCHC: 34.3 g/dL (ref 30.0–36.0)
MCV: 90.2 fL (ref 78.0–100.0)
Platelets: 223 10*3/uL (ref 150–400)
RBC: 4.49 MIL/uL (ref 3.87–5.11)
RDW: 13.1 % (ref 11.5–15.5)
WBC: 11.2 10*3/uL — AB (ref 4.0–10.5)

## 2013-10-01 LAB — HCG, QUANTITATIVE, PREGNANCY: HCG, BETA CHAIN, QUANT, S: 49 m[IU]/mL — AB (ref <5)

## 2013-10-01 LAB — URINE MICROSCOPIC-ADD ON

## 2013-10-01 LAB — ABO/RH: ABO/RH(D): O POS

## 2013-10-01 MED ORDER — IOHEXOL 300 MG/ML  SOLN
100.0000 mL | Freq: Once | INTRAMUSCULAR | Status: AC | PRN
  Administered 2013-10-01: 100 mL via INTRAVENOUS

## 2013-10-01 MED ORDER — HYDROCODONE-ACETAMINOPHEN 5-325 MG PO TABS: 1.0000 | ORAL_TABLET | ORAL | Status: DC | PRN

## 2013-10-01 MED ORDER — HYDROMORPHONE HCL PF 1 MG/ML IJ SOLN
1.0000 mg | Freq: Once | INTRAMUSCULAR | Status: AC
  Administered 2013-10-01: 1 mg via INTRAMUSCULAR
  Filled 2013-10-01: qty 1

## 2013-10-01 MED ORDER — LACTATED RINGERS IV SOLN
INTRAVENOUS | Status: DC
  Administered 2013-10-01: 13:00:00 via INTRAVENOUS

## 2013-10-01 MED ORDER — IOHEXOL 300 MG/ML  SOLN
50.0000 mL | Freq: Once | INTRAMUSCULAR | Status: AC | PRN
  Administered 2013-10-01: 50 mL via ORAL

## 2013-10-01 MED ORDER — KETOROLAC TROMETHAMINE 30 MG/ML IJ SOLN
30.0000 mg | Freq: Once | INTRAMUSCULAR | Status: AC
Start: 2013-10-01 — End: 2013-10-01
  Administered 2013-10-01: 30 mg via INTRAVENOUS
  Filled 2013-10-01: qty 1

## 2013-10-01 NOTE — MAU Provider Note (Signed)
History     CSN: [id number]  Arrival date and time: 10/01/13 8502   First Provider Initiated Contact with Patient 10/01/13 [phone]      Chief Complaint  Patient presents with  . Abdominal Pain   HPI   [name] is a 23 y.o. female G1P0 who presents with abdominal pain. The patient has been seen in her PCP office for beta hcg levels following spotting in early pregnancy; the patient took clomid for this desired pregnancy . She started experiencing lower back pain and pelvic pain last week. She had a very low beta hcg level in the office; she returned 48 hours later and her beta hcg level was lower. She was diagnosed with a miscarriage/failed pregnancy by her PCP and sent home with follow up instructions. She presents to MAU with worsening abdominal pain and lower back pain.  She has not had any vaginal bleeding. She feels like the pain has become severe this morning. The abdominal pain is worse on her left side. She took tylenol for pain and that helped minimally. She rates her pain 8/10 here in MAU. She did call the office and notified them that she was coming to MAU.   Beta hcg levels:  5/14: 73 5/18: 55 5/22: 40  OB History   Grav Para Term Preterm Abortions TAB SAB Ect Mult Living   1               Past Medical History  Diagnosis Date  . Thyroid disease     Hypothyroid - pt states no med - saw 2nd opinion - neg   . Endometriosis   . Ovarian cyst   . Allergy   . Asthma     no inhaler - uses nebalizer  . Seasonal allergies   . Bronchitis     history - per pt  . Headache(784.0)     otc med prn  . Arthritis     wrist, knees - otc med prn  . Anxiety     and panic attacks  . Depression     Past Surgical History  Procedure Laterality Date  . Dermoid cyst  excision      right side  . Diagnostic laparoscopy    . Laparoscopy Left 10/08/2012    Procedure: LAPAROSCOPY OPERATIVE;REMOVAL OF [place] AND  REPLACEMENT OF [place];  Surgeon: Terrance Mass, MD;  Location: [facility];  Service: Gynecology;  Laterality: Left;  left upper arm and right upper arm  . I&d extremity Right 04/06/2013    Procedure: REMOVAL OF IMPLANT RIGHT ARM--IMPLANT DISCARDED;  Surgeon: Cammie Sickle., MD;  Location: [place];  Service: Orthopedics;  Laterality: Right;  . Nexplanon removed      Family History  Problem Relation Age of Onset  . Heart disease Mother   . Hypertension Mother   . Hyperlipidemia Father   . Diabetes Other   . Hypertension Other   . Hypertension Maternal Grandmother   . COPD Maternal Grandmother   . COPD Maternal Grandfather   . Hypertension Paternal Grandmother   . Diabetes Paternal Grandmother   . COPD Paternal Grandfather   . Cancer Sister     History  Substance Use Topics  . Smoking status: Never Smoker   . Smokeless tobacco: Never Used  . Alcohol Use: No     Comment: social -- 3-4 times per year     Allergies:  Allergies  Allergen Reactions  . Seroquel [Quetiapine Fumerate] Swelling  Mouth numbness  . Tape Rash    Prescriptions prior to admission  Medication Sig Dispense Refill  . acetaminophen (TYLENOL) 500 MG tablet Take 1,000 mg by mouth every 6 (six) hours as needed for mild pain or moderate pain.      . Prenatal Vit-Fe Fumarate-FA (PRENATAL MULTIVITAMIN) TABS tablet Take 1 tablet by mouth daily at 12 noon.       US Ob Comp Less 14 Wks  10/01/2013   CLINICAL DATA:  Worsening left lower quadrant pain. Decreased quantitative beta HCG levels. Last menstrual period was 08/18/2013.  EXAM: OBSTETRIC <14 WK [place] AND TRANSVAGINAL OB US  TECHNIQUE: Both transabdominal and transvaginal ultrasound examinations were performed for complete evaluation of the gestation as well as the maternal uterus, adnexal regions, and pelvic cul-de-sac. Transvaginal technique was performed to assess early pregnancy.  COMPARISON:  None.  FINDINGS: Intrauterine gestational sac: Visualized/normal in shape.  Yolk sac:   No  Embryo:  No  Cardiac Activity: No  Heart Rate:  Not applicable bpm  MSD:  3  mm   4 w   5  d           [place] EDC: 06/05/2014.  Maternal uterus/adnexae:  Subchorionic hemorrhage: None  Right ovary: Not visual  Left ovary: Not visualized.  Other :Complex cystic structure within the left ovary measures 7.6 x 4.3 x 3.8 cm. This contains a least 3 separate loculations 1 of which is complicated by internal debris.  Free fluid:  None.  IMPRESSION: 1. Findings are suspicious but not yet definitive for failed pregnancy. Recommend follow-up US in 10-14 days for definitive diagnosis. This recommendation follows SRU consensus guidelines: Diagnostic Criteria for Nonviable Pregnancy Early in the First Trimester. [employer] Med 2013; 092:3300-76. 2. Complex cystic mass is identified within the left adnexa. In the acute setting this could represent a tubo-ovarian abscess. If there are no symptoms of infection then benign or cystic neoplasm may also have this appearance. Careful clinical correlation is advised.  These results will be called to the ordering clinician or representative by the Radiologist Assistant, and communication documented in the PACS or zVision Dashboard.   Electronically Signed   By: Kerby Moors M.D.   On: 10/01/2013 10:12   US Ob Transvaginal  10/01/2013   CLINICAL DATA:  Worsening left lower quadrant pain. Decreased quantitative beta HCG levels. Last menstrual period was 08/18/2013.  EXAM: OBSTETRIC <14 WK [place] AND TRANSVAGINAL OB US  TECHNIQUE: Both transabdominal and transvaginal ultrasound examinations were performed for complete evaluation of the gestation as well as the maternal uterus, adnexal regions, and pelvic cul-de-sac. Transvaginal technique was performed to assess early pregnancy.  COMPARISON:  None.  FINDINGS: Intrauterine gestational sac: Visualized/normal in shape.  Yolk sac:  No  Embryo:  No  Cardiac Activity: No  Heart Rate:  Not applicable bpm  MSD:  3  mm   4 w   5  d           [place] EDC:  06/05/2014.  Maternal uterus/adnexae:  Subchorionic hemorrhage: None  Right ovary: Not visual  Left ovary: Not visualized.  Other :Complex cystic structure within the left ovary measures 7.6 x 4.3 x 3.8 cm. This contains a least 3 separate loculations 1 of which is complicated by internal debris.  Free fluid:  None.  IMPRESSION: 1. Findings are suspicious but not yet definitive for failed pregnancy. Recommend follow-up US in 10-14 days for definitive diagnosis. This recommendation follows SRU consensus guidelines: Diagnostic Criteria  for Nonviable Pregnancy Early in the First Trimester. [employer] Med 2013; 947:6546-50. 2. Complex cystic mass is identified within the left adnexa. In the acute setting this could represent a tubo-ovarian abscess. If there are no symptoms of infection then benign or cystic neoplasm may also have this appearance. Careful clinical correlation is advised.  These results will be called to the ordering clinician or representative by the Radiologist Assistant, and communication documented in the PACS or zVision Dashboard.   Electronically Signed   By: Kerby Moors M.D.   On: 10/01/2013 10:12   Ct Abdomen Pelvis W Contrast  10/01/2013   CLINICAL DATA:  Worsening LEFT lower quadrant pain, pregnant with decreasing beta HCG levels, ultrasound findings suspicious for failed pregnancy, complex cystic LEFT adnexal mass question tube ovarian abscess  EXAM: CT ABDOMEN AND PELVIS WITH CONTRAST  TECHNIQUE: Multidetector CT imaging of the abdomen and pelvis was performed using the standard protocol following bolus administration of intravenous contrast. Sagittal and coronal MPR images reconstructed from axial data set.  CONTRAST:  160m OMNIPAQUE IOHEXOL 300 MG/ML  SOLN  COMPARISON:  Pelvic ultrasound 10/01/2013, CT abdomen and pelvis 10/03/2006  FINDINGS: Lung bases clear.  4 mm nonspecific low-attenuation focus RIGHT lobe liver image 19.  Liver, spleen, pancreas, kidneys, and adrenal glands normal  appearance.  Normal appendix.  Unremarkable uterus, bladder, and ureters by CT.  Specifically, no uterine mass or fluid collection/gestational sac identified by CT.  Both ovaries enlarged containing multiple cysts.  LEFT ovary has 3 cysts largest measuring 3.8 x 2.9 cm.  RIGHT ovary demonstrates 2 cysts, larger of which measures 4.1 x 3.5 cm with a single septation.  None of the cysts appear to communicate and no definite fallopian tube dilatation is identified to suggest tubo-ovarian abscess.  Stomach and bowel loops normal appearance.  Small amount of free fluid is seen at the inferior aspect of the LEFT pericolic gutter, low-attenuation, extending from the LEFT ovary superiorly.  Due to position this was not detected on the prior ultrasound.  No additional mass, adenopathy, free air or inflammatory process.  Retroaortic LEFT renal vein noted.  No acute osseous findings.  IMPRESSION: BILATERAL ovarian cysts as above, largest cyst in RIGHT ovary appearing to be complicated by a septation.  Small amount of nonspecific LEFT pelvic free fluid at the inferior aspect of the LEFT pericolic gutter.  No definite evidence of tubo-ovarian abscess.  Followup ultrasound was recommended in 6-12 weeks to reassess the cystic lesions in both ovaries to ensure these represent separate cysts rather than a large multiloculated cystic mass.   Electronically Signed   By: MLavonia DanaM.D.   On: 10/01/2013 15:14    Results for orders placed during the hospital encounter of 10/01/13 (from the past 48 hour(s))  URINALYSIS, ROUTINE W REFLEX MICROSCOPIC     Status: Abnormal   Collection Time    10/01/13  7:35 AM      Result Value Ref Range   Color, Urine YELLOW  YELLOW   APPearance CLEAR  CLEAR   Specific Gravity, Urine >1.030 (*) 1.005 - 1.030   pH 6.0  5.0 - 8.0   Glucose, UA NEGATIVE  NEGATIVE mg/dL   Hgb urine dipstick TRACE (*) NEGATIVE   Bilirubin Urine NEGATIVE  NEGATIVE   Ketones, ur NEGATIVE  NEGATIVE mg/dL    Protein, ur NEGATIVE  NEGATIVE mg/dL   Urobilinogen, UA 0.2  0.0 - 1.0 mg/dL   Nitrite NEGATIVE  NEGATIVE   Leukocytes, UA NEGATIVE  NEGATIVE  URINE MICROSCOPIC-ADD ON     Status: None   Collection Time    10/01/13  7:35 AM      Result Value Ref Range   Squamous Epithelial / LPF RARE  RARE   WBC, UA 0-2  <3 WBC/hpf   RBC / HPF 0-2  <3 RBC/hpf   Urine-Other MUCOUS PRESENT    HCG, QUANTITATIVE, PREGNANCY     Status: Abnormal   Collection Time    10/01/13  8:15 AM      Result Value Ref Range   hCG, Beta Chain, Quant, S 49 (*) <5 mIU/mL   Comment:              GEST. AGE      CONC.  (mIU/mL)       <=1 WEEK        5 - 50         2 WEEKS       50 - 500         3 WEEKS       100 - 10,000         4 WEEKS     1,000 - 30,000         5 WEEKS     3,500 - 115,000       6-8 WEEKS     12,000 - 270,000        12 WEEKS     15,000 - 220,000                FEMALE AND NON-PREGNANT FEMALE:         LESS THAN 5 mIU/mL  CBC     Status: Abnormal   Collection Time    10/01/13  8:15 AM      Result Value Ref Range   WBC 11.2 (*) 4.0 - 10.5 K/uL   RBC 4.49  3.87 - 5.11 MIL/uL   Hemoglobin 13.9  12.0 - 15.0 g/dL   HCT 40.5  36.0 - 46.0 %   MCV 90.2  78.0 - 100.0 fL   MCH 31.0  26.0 - 34.0 pg   MCHC 34.3  30.0 - 36.0 g/dL   RDW 13.1  11.5 - 15.5 %   Platelets 223  150 - 400 K/uL  ABO/RH     Status: None   Collection Time    10/01/13  8:17 AM      Result Value Ref Range   ABO/RH(D) O POS    COMPREHENSIVE METABOLIC PANEL     Status: Abnormal   Collection Time    10/01/13  8:20 AM      Result Value Ref Range   Sodium 140  137 - 147 mEq/L   Potassium 4.4  3.7 - 5.3 mEq/L   Chloride 103  96 - 112 mEq/L   CO2 24  19 - 32 mEq/L   Glucose, Bld 92  70 - 99 mg/dL   BUN 11  6 - 23 mg/dL   Creatinine, Ser 0.67  0.50 - 1.10 mg/dL   Calcium 9.2  8.4 - 10.5 mg/dL   Total Protein 6.8  6.0 - 8.3 g/dL   Albumin 3.7  3.5 - 5.2 g/dL   AST 18  0 - 37 U/L   ALT 8  0 - 35 U/L   Alkaline Phosphatase 138 (*) 39  - 117 U/L   Total Bilirubin 0.2 (*) 0.3 - 1.2 mg/dL   GFR calc non Af Amer >90  >90 mL/min  GFR calc Af Amer >90  >90 mL/min   Comment: (NOTE)     The eGFR has been calculated using the CKD EPI equation.     This calculation has not been validated in all clinical situations.     eGFR's persistently <90 mL/min signify possible Chronic Kidney     Disease.    Review of Systems  Gastrointestinal: Positive for abdominal pain (Bilateral lower abdominal pain LLQ > RLQ. Pain is worse with movement).  Musculoskeletal: Positive for back pain (Bilateral lower back pain. At times the abdominal pain radiates to her back. ) and myalgias (Occasional left shoulder pain ).   Physical Exam   Blood pressure 147/87, pulse 86, temperature 98.6 F (37 C), temperature source Oral, resp. rate 16, height 5' 8.5" (1.74 m), weight 94.802 kg (209 lb), last menstrual period 08/18/2013, SpO2 99.00%.  Physical Exam  Constitutional: She is oriented to person, place, and time. She appears well-developed and well-nourished.  Non-toxic appearance. She does not have a sickly appearance. She does not appear ill. No distress.  HENT:  Head: Normocephalic.  Eyes: Pupils are equal, round, and reactive to light.  Neck: Normal range of motion.  Respiratory: Effort normal.  GI: Soft. Normal appearance. There is generalized tenderness. There is no rigidity, no rebound, no guarding and no CVA tenderness.  Musculoskeletal: Normal range of motion.  Neurological: She is alert and oriented to person, place, and time.  Skin: Skin is warm. She is not diaphoretic.  Psychiatric: Her behavior is normal.    MAU Course  Procedures None  MDM 1 mg of dilaudid given IM  Per Elon Alas NP the patient is being followed in the office for a failed pregnancy.  Discussed US findings with Dr. Clovis Riley who agrees that a CT would give [place] a better understanding of the complex cystic structure in the left ovary and also to help identify  tubo-ovarian cyst.  Discussed US findings with Elon Alas NP at Dr. Toney Rakes office who recommends CT with and without contrast; consent given to radiology due to +hcg level.  Discussed CT and US findings with Dr. Toney Rakes. He would like to have the patient follow up in MAU next Thursday for a Beta hcg level and to schedule an appointment following MAU with Elon Alas NP to discuss this visit and beta hcg level.   Assessment and Plan   A:  1. Pelvic pain in female   2. Other and unspecified ovarian cyst   3.  Spontaneous AB    P:  Discharge home in stable condition Pt to follow up in MAU on Thursday for beta hcg level and then she is to go to her PCP following Beta level; pt made aware.  RX: vicodin  Return to MAU if symptoms worsen   Darrelyn Hillock Rasch, NP  10/04/2013, 8:15 AM

## 2013-10-01 NOTE — Discharge Instructions (Signed)
Ovarian Cyst °An ovarian cyst is a fluid-filled sac that forms on an ovary. The ovaries are small organs that produce eggs in women. Various types of cysts can form on the ovaries. Most are not cancerous. Many do not cause problems, and they often go away on their own. Some may cause symptoms and require treatment. Common types of ovarian cysts include: °· Functional cysts These cysts may occur every month during the menstrual cycle. This is normal. The cysts usually go away with the next menstrual cycle if the woman does not get pregnant. Usually, there are no symptoms with a functional cyst. °· Endometrioma cysts These cysts form from the tissue that lines the uterus. They are also called "chocolate cysts" because they become filled with blood that turns brown. This type of cyst can cause pain in the lower abdomen during intercourse and with your menstrual period. °· Cystadenoma cysts This type develops from the cells on the outside of the ovary. These cysts can get very big and cause lower abdomen pain and pain with intercourse. This type of cyst can twist on itself, cut off its blood supply, and cause severe pain. It can also easily rupture and cause a lot of pain. °· Dermoid cysts This type of cyst is sometimes found in both ovaries. These cysts may contain different kinds of body tissue, such as skin, teeth, hair, or cartilage. They usually do not cause symptoms unless they get very big. °· Theca lutein cysts These cysts occur when too much of a certain hormone (human chorionic gonadotropin) is produced and overstimulates the ovaries to produce an egg. This is most common after procedures used to assist with the conception of a baby (in vitro fertilization). °CAUSES  °· Fertility drugs can cause a condition in which multiple large cysts are formed on the ovaries. This is called ovarian hyperstimulation syndrome. °· A condition called polycystic ovary syndrome can cause hormonal imbalances that can lead to  nonfunctional ovarian cysts. °SIGNS AND SYMPTOMS  °Many ovarian cysts do not cause symptoms. If symptoms are present, they may include: °· Pelvic pain or pressure. °· Pain in the lower abdomen. °· Pain during sexual intercourse. °· Increasing girth (swelling) of the abdomen. °· Abnormal menstrual periods. °· Increasing pain with menstrual periods. °· Stopping having menstrual periods without being pregnant. °DIAGNOSIS  °These cysts are commonly found during a routine or annual pelvic exam. Tests may be ordered to find out more about the cyst. These tests may include: °· Ultrasound. °· X-ray of the pelvis. °· CT scan. °· MRI. °· Blood tests. °TREATMENT  °Many ovarian cysts go away on their own without treatment. Your health care provider may want to check your cyst regularly for 2 3 months to see if it changes. For women in menopause, it is particularly important to monitor a cyst closely because of the higher rate of ovarian cancer in menopausal women. When treatment is needed, it may include any of the following: °· A procedure to drain the cyst (aspiration). This may be done using a long needle and ultrasound. It can also be done through a laparoscopic procedure. This involves using a thin, lighted tube with a tiny camera on the end (laparoscope) inserted through a small incision. °· Surgery to remove the whole cyst. This may be done using laparoscopic surgery or an open surgery involving a larger incision in the lower abdomen. °· Hormone treatment or birth control pills. These methods are sometimes used to help dissolve a cyst. °HOME CARE   INSTRUCTIONS   Only take over-the-counter or prescription medicines as directed by your health care provider.  Follow up with your health care provider as directed.  Get regular pelvic exams and Pap tests. SEEK MEDICAL CARE IF:   Your periods are late, irregular, or painful, or they stop.  Your pelvic pain or abdominal pain does not go away.  Your abdomen becomes  larger or swollen.  You have pressure on your bladder or trouble emptying your bladder completely.  You have pain during sexual intercourse.  You have feelings of fullness, pressure, or discomfort in your stomach.  You lose weight for no apparent reason.  You feel generally ill.  You become constipated.  You lose your appetite.  You develop acne.  You have an increase in body and facial hair.  You are gaining weight, without changing your exercise and eating habits.  You think you are pregnant. SEEK IMMEDIATE MEDICAL CARE IF:   You have increasing abdominal pain.  You feel sick to your stomach (nauseous), and you throw up (vomit).  You develop a fever that comes on suddenly.  You have abdominal pain during a bowel movement.  Your menstrual periods become heavier than usual. Document Released: 04/29/2005 Document Revised: 02/17/2013 Document Reviewed: 01/04/2013 [facility] Patient Information 2014 [place].  Abdominal Pain, Women Abdominal (stomach, pelvic, or belly) pain can be caused by many things. It is important to tell your doctor:  The location of the pain.  Does it come and go or is it present all the time?  Are there things that start the pain (eating certain foods, exercise)?  Are there other symptoms associated with the pain (fever, nausea, vomiting, diarrhea)? All of this is helpful to know when trying to find the cause of the pain. CAUSES   Stomach: virus or bacteria infection, or ulcer.  Intestine: appendicitis (inflamed appendix), regional ileitis (Crohn's disease), ulcerative colitis (inflamed colon), irritable bowel syndrome, diverticulitis (inflamed diverticulum of the colon), or cancer of the stomach or intestine.  Gallbladder disease or stones in the gallbladder.  Kidney disease, kidney stones, or infection.  Pancreas infection or cancer.  Fibromyalgia (pain disorder).  Diseases of the female organs:  Uterus: fibroid  (non-cancerous) tumors or infection.  Fallopian tubes: infection or tubal pregnancy.  Ovary: cysts or tumors.  Pelvic adhesions (scar tissue).  Endometriosis (uterus lining tissue growing in the pelvis and on the pelvic organs).  Pelvic congestion syndrome (female organs filling up with blood just before the menstrual period).  Pain with the menstrual period.  Pain with ovulation (producing an egg).  Pain with an IUD (intrauterine device, birth control) in the uterus.  Cancer of the female organs.  Functional pain (pain not caused by a disease, may improve without treatment).  Psychological pain.  Depression. DIAGNOSIS  Your doctor will decide the seriousness of your pain by doing an examination.  Blood tests.  X-rays.  Ultrasound.  CT scan (computed tomography, special type of X-ray).  MRI (magnetic resonance imaging).  Cultures, for infection.  Barium enema (dye inserted in the large intestine, to better view it with X-rays).  Colonoscopy (looking in intestine with a lighted tube).  Laparoscopy (minor surgery, looking in abdomen with a lighted tube).  Major abdominal exploratory surgery (looking in abdomen with a large incision). TREATMENT  The treatment will depend on the cause of the pain.   Many cases can be observed and treated at home.  Over-the-counter medicines recommended by your caregiver.  Prescription medicine.  Antibiotics, for infection.  Birth control pills, for painful periods or for ovulation pain. °· Hormone treatment, for endometriosis. °· Nerve blocking injections. °· Physical therapy. °· Antidepressants. °· Counseling with a psychologist or psychiatrist. °· Minor or major surgery. °HOME CARE INSTRUCTIONS  °· Do not take laxatives, unless directed by your caregiver. °· Take over-the-counter pain medicine only if ordered by your caregiver. Do not take aspirin because it can cause an upset stomach or bleeding. °· Try a clear liquid diet  (broth or water) as ordered by your caregiver. Slowly move to a bland diet, as tolerated, if the pain is related to the stomach or intestine. °· Have a thermometer and take your temperature several times a day, and record it. °· Bed rest and sleep, if it helps the pain. °· Avoid sexual intercourse, if it causes pain. °· Avoid stressful situations. °· Keep your follow-up appointments and tests, as your caregiver orders. °· If the pain does not go away with medicine or surgery, you may try: °· Acupuncture. °· Relaxation exercises (yoga, meditation). °· Group therapy. °· Counseling. °SEEK MEDICAL CARE IF:  °· You notice certain foods cause stomach pain. °· Your home care treatment is not helping your pain. °· You need stronger pain medicine. °· You want your IUD removed. °· You feel faint or lightheaded. °· You develop nausea and vomiting. °· You develop a rash. °· You are having side effects or an allergy to your medicine. °SEEK IMMEDIATE MEDICAL CARE IF:  °· Your pain does not go away or gets worse. °· You have a fever. °· Your pain is felt only in portions of the abdomen. The right side could possibly be appendicitis. The left lower portion of the abdomen could be colitis or diverticulitis. °· You are passing blood in your stools (bright red or black tarry stools, with or without vomiting). °· You have blood in your urine. °· You develop chills, with or without a fever. °· You pass out. °MAKE SURE YOU:  °· Understand these instructions. °· Will watch your condition. °· Will get help right away if you are not doing well or get worse. °Document Released: 02/24/2007 Document Revised: 07/22/2011 Document Reviewed: 03/16/2009 °[employer]® Patient Information ©2014 [employer]. ° °

## 2013-10-01 NOTE — MAU Note (Signed)
Patient states she had a SAB on 5-13. States she continues to have constant lower abdominal pressure and pain on the left side. Has gotten worse. Denies bleeding or discharge. Has nausea due to the pain. Took Tylenol at 0515 without relief.

## 2013-10-01 NOTE — MAU Note (Signed)
Assumed care of patient.

## 2013-10-03 ENCOUNTER — Telehealth: Payer: Self-pay | Admitting: Women's Health

## 2013-10-03 NOTE — Telephone Encounter (Signed)
Telephone call, states feeling better, still uncomfortable. Will return for repeat quant. and office appointment

## 2013-10-06 ENCOUNTER — Other Ambulatory Visit: Payer: BC Managed Care – PPO

## 2013-10-06 ENCOUNTER — Encounter: Payer: Self-pay | Admitting: Women's Health

## 2013-10-06 DIAGNOSIS — O039 Complete or unspecified spontaneous abortion without complication: Secondary | ICD-10-CM

## 2013-10-06 LAB — HCG, QUANTITATIVE, PREGNANCY: hCG, Beta Chain, Quant, S: 46.56 m[IU]/mL

## 2013-10-08 ENCOUNTER — Other Ambulatory Visit: Payer: Self-pay | Admitting: Women's Health

## 2013-10-08 ENCOUNTER — Encounter: Payer: Self-pay | Admitting: Women's Health

## 2013-10-08 DIAGNOSIS — O039 Complete or unspecified spontaneous abortion without complication: Secondary | ICD-10-CM

## 2013-10-11 ENCOUNTER — Encounter: Payer: Self-pay | Admitting: Women's Health

## 2013-10-12 ENCOUNTER — Encounter: Payer: Self-pay | Admitting: Women's Health

## 2013-10-13 ENCOUNTER — Other Ambulatory Visit: Payer: BC Managed Care – PPO

## 2013-10-13 DIAGNOSIS — O039 Complete or unspecified spontaneous abortion without complication: Secondary | ICD-10-CM

## 2013-10-13 LAB — HCG, QUANTITATIVE, PREGNANCY: HCG, BETA CHAIN, QUANT, S: 41.1 m[IU]/mL

## 2013-10-14 ENCOUNTER — Encounter (HOSPITAL_COMMUNITY): Payer: Self-pay

## 2013-10-14 ENCOUNTER — Encounter: Payer: Self-pay | Admitting: Women's Health

## 2013-10-14 ENCOUNTER — Ambulatory Visit (INDEPENDENT_AMBULATORY_CARE_PROVIDER_SITE_OTHER): Payer: BC Managed Care – PPO

## 2013-10-14 ENCOUNTER — Other Ambulatory Visit: Payer: Self-pay | Admitting: Women's Health

## 2013-10-14 ENCOUNTER — Ambulatory Visit (INDEPENDENT_AMBULATORY_CARE_PROVIDER_SITE_OTHER): Payer: BC Managed Care – PPO | Admitting: Women's Health

## 2013-10-14 ENCOUNTER — Inpatient Hospital Stay (HOSPITAL_COMMUNITY)
Admission: AD | Admit: 2013-10-14 | Discharge: 2013-10-14 | Disposition: A | Discharge status: Home / Self Care | Payer: BC Managed Care – PPO | Source: Ambulatory Visit | Attending: Gynecology | Admitting: Gynecology

## 2013-10-14 DIAGNOSIS — Z975 Presence of (intrauterine) contraceptive device: Secondary | ICD-10-CM

## 2013-10-14 DIAGNOSIS — O021 Missed abortion: Secondary | ICD-10-CM

## 2013-10-14 DIAGNOSIS — O039 Complete or unspecified spontaneous abortion without complication: Secondary | ICD-10-CM

## 2013-10-14 DIAGNOSIS — N839 Noninflammatory disorder of ovary, fallopian tube and broad ligament, unspecified: Secondary | ICD-10-CM

## 2013-10-14 DIAGNOSIS — N83202 Unspecified ovarian cyst, left side: Secondary | ICD-10-CM

## 2013-10-14 DIAGNOSIS — N838 Other noninflammatory disorders of ovary, fallopian tube and broad ligament: Secondary | ICD-10-CM

## 2013-10-14 DIAGNOSIS — R188 Other ascites: Secondary | ICD-10-CM

## 2013-10-14 DIAGNOSIS — N83209 Unspecified ovarian cyst, unspecified side: Secondary | ICD-10-CM

## 2013-10-14 DIAGNOSIS — D3910 Neoplasm of uncertain behavior of unspecified ovary: Secondary | ICD-10-CM

## 2013-10-14 DIAGNOSIS — N83201 Unspecified ovarian cyst, right side: Secondary | ICD-10-CM

## 2013-10-14 DIAGNOSIS — R9389 Abnormal findings on diagnostic imaging of other specified body structures: Secondary | ICD-10-CM

## 2013-10-14 LAB — CBC
HEMATOCRIT: 41.2 % (ref 36.0–46.0)
Hemoglobin: 14.3 g/dL (ref 12.0–15.0)
MCH: 31.6 pg (ref 26.0–34.0)
MCHC: 34.7 g/dL (ref 30.0–36.0)
MCV: 90.9 fL (ref 78.0–100.0)
Platelets: 250 10*3/uL (ref 150–400)
RBC: 4.53 MIL/uL (ref 3.87–5.11)
RDW: 13.6 % (ref 11.5–15.5)
WBC: 13.9 10*3/uL — AB (ref 4.0–10.5)

## 2013-10-14 LAB — HCG, QUANTITATIVE, PREGNANCY: hCG, Beta Chain, Quant, S: 35 m[IU]/mL — ABNORMAL HIGH (ref <5)

## 2013-10-14 NOTE — MAU Note (Signed)
Patient states she was sent from the office for possible injection of MTX. Has had abnormal decrese in BHCG's since SAB. Has dizzy spells off and on. Has had mild cramping since ultrasound today. Denies bleeding. Dr. Toney Rakes in triage talking with patient. Hold on MTX for now.

## 2013-10-14 NOTE — Progress Notes (Addendum)
Patient ID: Erika Armstrong, female   DOB: 05-10-1991, 23 y.o.   MRN: [id number] Presents for followup ultrasound. Had an unusual cycle 09/2012 after Clomid and took clomid 100mg  for 5 days.  Quantitative hCGs:  09/23/2013 - 73                                  09/27/2013 - 55                                  10/01/2013 - 49                                  10/06/2013 - 46                                  11/09/2013 - 41                                  10/14/2013     At hospital Quant 35    10/01/2013 had severe lower left quadrant pain, Hospital  -  CT 3 left ovarian cysts  largest 3.8 x 2.9 cm, rt ovarian cyst 4.1 x 3.5 largest. Ultrasound showed a positive gestational sac without a yolk sac. Left ovarian cyst 7.6 x 4.3 x 3.8 cm cyst with internal debris, right ovary not reported on ultrasound report, question if cyst noted on wrong side.  Pain free today. Denies bleeding, minimal intermittent cramping.   Ultrasound: Transvaginal and transabdominal. Anteverted uterus displaced by right ovarian mass, endometrium NO gestational sac noted. Negative color flow. Right ovary enlarged echo-free cyst 35 x 32 mm, cystic solid mass 64 x 62 x 70 mm layering of debris with negative CFD increased from 5/22. Left ovary thickwalled cystic 31 x 26 mm internal lead level echoes negative CFD. Echogenic free fluid above uterus.  Appears well. Abdomen soft without rebound or tenderness.  Persistent low quantitative hCGs Right ovarian cyst/questionable ovarian over stimulation  Plan made with Dr. Toney Rakes, methotrexate today,  CBC, SGOT, BUN, creatinine, O+ blood type. Repeat hCG Monday 10/18/2013 here at our office. Ultrasound and office visit with Dr. Toney Rakes 10/19/2013. [facility]. Patient verbalized understanding of plan. Plan for methotrexate cancelled after Dr Toney Rakes reviewed CT and [place] with radiologist. Falling HCG and stable CBC.  Results for MOMO, BRAUN (MRN [id number]) as  of 10/15/2013 07:45  Ref. Range 10/06/2013 11:25 10/13/2013 11:04 10/14/2013 18:45 10/14/2013 18:50  hCG, Beta Chain, Quant, S Latest Range: <5 mIU/mL 46.56 41.1 35 (H)    Results for ANITA, LAGUNA (MRN [id number]) as of 10/15/2013 07:45  Ref. Range 10/01/2013 08:15 10/01/2013 08:17 10/01/2013 08:20 10/06/2013 11:25 10/13/2013 11:04 10/14/2013 18:45 10/14/2013 18:50  WBC Latest Range: 4.6-10.2 K/uL 11.2 (H)      13.9 (H)  RBC Latest Range: 4.04-5.48 M/uL 4.49      4.53  Hemoglobin Latest Range: 12.0-15.0 g/dL 13.9      14.3  HCT Latest Range: 36.0-46.0 % 40.5      41.2  MCV Latest Range: 80-97 fL 90.2      90.9  MCH Latest Range: 27-31.2 pg 31.0  31.6  MCHC Latest Range: 31.8-35.4 g/dL 34.3      34.7  RDW Latest Range: 11.5-15.5 % 13.1      13.6  Platelets Latest Range: 150-400 K/uL 223      250

## 2013-10-14 NOTE — MAU Note (Signed)
Dr Toney Rakes to talk with pt and send pt home.

## 2013-10-15 ENCOUNTER — Encounter: Payer: BC Managed Care – PPO | Admitting: Gynecology

## 2013-10-18 ENCOUNTER — Other Ambulatory Visit: Payer: Self-pay | Admitting: Gynecology

## 2013-10-18 ENCOUNTER — Other Ambulatory Visit: Payer: BC Managed Care – PPO

## 2013-10-18 ENCOUNTER — Other Ambulatory Visit: Payer: Self-pay | Admitting: *Deleted

## 2013-10-18 DIAGNOSIS — O039 Complete or unspecified spontaneous abortion without complication: Secondary | ICD-10-CM

## 2013-10-18 DIAGNOSIS — N83209 Unspecified ovarian cyst, unspecified side: Secondary | ICD-10-CM

## 2013-10-18 DIAGNOSIS — O021 Missed abortion: Secondary | ICD-10-CM

## 2013-10-19 ENCOUNTER — Encounter: Payer: Self-pay | Admitting: Gynecology

## 2013-10-19 ENCOUNTER — Ambulatory Visit (INDEPENDENT_AMBULATORY_CARE_PROVIDER_SITE_OTHER): Payer: BC Managed Care – PPO | Admitting: Gynecology

## 2013-10-19 ENCOUNTER — Ambulatory Visit: Payer: BC Managed Care – PPO

## 2013-10-19 VITALS — BP 126/82

## 2013-10-19 DIAGNOSIS — N83209 Unspecified ovarian cyst, unspecified side: Secondary | ICD-10-CM

## 2013-10-19 DIAGNOSIS — Z3490 Encounter for supervision of normal pregnancy, unspecified, unspecified trimester: Secondary | ICD-10-CM

## 2013-10-19 DIAGNOSIS — O039 Complete or unspecified spontaneous abortion without complication: Secondary | ICD-10-CM

## 2013-10-19 LAB — HCG, QUANTITATIVE, PREGNANCY: HCG, BETA CHAIN, QUANT, S: 50.1 m[IU]/mL

## 2013-10-19 NOTE — Progress Notes (Signed)
   The patient presented to the office today for followup ultrasound. Patient has been followed closely since it appears that she became hyperstimulated with her second dose of clomiphene citrate which had been managed her because of her history of oligomenorrhea and infertility. Evaluation as an assessment since May 14 as follows:  Quantitative hCGs: 09/23/2013 - 73  09/27/2013 - 55  10/01/2013 - 49  10/06/2013 - 46  11/09/2013 - 41  10/14/2013 At hospital Quant 35  10/19/2013-50  10/01/2013 had severe lower left quadrant pain, Hospital - CT 3 left ovarian cysts largest 3.8 x 2.9 cm, rt ovarian cyst 4.1 x 3.5 largest. Ultrasound showed a positive gestational sac without a yolk sac. Left ovarian cyst 7.6 x 4.3 x 3.8 cm cyst with internal debris, right ovary not reported on ultrasound report, question if cyst noted on wrong side.  10/14/2013 office visit patient was not having any pain that day minimal intermittent cramping and denied any bleeding. Ultrasound done in the office demonstrated the following: Anteverted uterus displaced by right ovarian mass, endometrium NO gestational sac noted. Negative color flow. Right ovary enlarged echo-free cyst 35 x 32 mm, cystic solid mass 64 x 62 x 70 mm layering of debris with negative CFD increased from 5/22. Left ovary thickwalled cystic 31 x 26 mm internal lead level echoes negative CFD. Echogenic free fluid above uterus. Patient was not having any rebound or guarding her a tenderness.  Hemoglobin 10/01/2013   13.9 Hemoglobin 10/14/2013   14.3  She returns today for followup and ultrasound. She was in no acute distress. Ultrasound demonstrated the following: Uterus measures 9.0 x 6.8 x 5.1 cm endometrial stripe 6.9 mm. No evidence of gestational sac. Avascular endometrium. Right ovary enlarged with cystic mass thin-walled measures 6.1 x 6.2 x 6.4 mm with solid echogenic focus measuring 29 x 44 mm with positive cold flow murmur of the cyst appears to be a blood  clot centrally located. The echo-free cyst measuring 32 x 25 mm negative color flow. Left ovary thinwall cyst 31 x 25 mm, 16 x 16 mm, internal low level echo. A negative color flow. Echogenic free fluid noted above the uterus.  Assessment/plan: Patient with clinical evidence of hyperstimulated ovaries after only 2 rounds of clomiphene citrate with an early pregnancy appears to miscarried but cannot rule out the possibility of a small ectopic pregnancy. Quantitative beta hCG had begun to drop but recently leveled off. We're going to repeat the quantitative beta hCG in 48 hours. I've discussed about initiating methotrexate after the next quantitative beta-hCG depending on the results. The risks benefits and pros and cons of medication will be discussed with the patient as well as literature formation provided. Patient is O+.

## 2013-10-20 ENCOUNTER — Ambulatory Visit (HOSPITAL_COMMUNITY): Admission: RE | Admit: 2013-10-20 | Payer: BC Managed Care – PPO | Source: Ambulatory Visit | Admitting: Gynecology

## 2013-10-20 ENCOUNTER — Encounter (HOSPITAL_COMMUNITY): Admission: RE | Payer: Self-pay | Source: Ambulatory Visit

## 2013-10-20 SURGERY — LAPAROTOMY, EXPLORATORY
Anesthesia: General

## 2013-10-21 ENCOUNTER — Ambulatory Visit (INDEPENDENT_AMBULATORY_CARE_PROVIDER_SITE_OTHER): Payer: BC Managed Care – PPO | Admitting: Gynecology

## 2013-10-21 ENCOUNTER — Encounter: Payer: Self-pay | Admitting: Gynecology

## 2013-10-21 ENCOUNTER — Other Ambulatory Visit: Payer: BC Managed Care – PPO

## 2013-10-21 ENCOUNTER — Encounter (HOSPITAL_COMMUNITY): Payer: Self-pay | Admitting: *Deleted

## 2013-10-21 ENCOUNTER — Inpatient Hospital Stay (HOSPITAL_COMMUNITY)
Admission: AD | Admit: 2013-10-21 | Discharge: 2013-10-21 | Disposition: A | Discharge status: Home / Self Care | Payer: BC Managed Care – PPO | Source: Ambulatory Visit | Attending: Gynecology | Admitting: Gynecology

## 2013-10-21 VITALS — BP 124/78

## 2013-10-21 DIAGNOSIS — O039 Complete or unspecified spontaneous abortion without complication: Secondary | ICD-10-CM

## 2013-10-21 DIAGNOSIS — O00109 Unspecified tubal pregnancy without intrauterine pregnancy: Secondary | ICD-10-CM | POA: Insufficient documentation

## 2013-10-21 DIAGNOSIS — Z3490 Encounter for supervision of normal pregnancy, unspecified, unspecified trimester: Secondary | ICD-10-CM

## 2013-10-21 LAB — HCG, QUANTITATIVE, PREGNANCY: HCG, BETA CHAIN, QUANT, S: 39.6 m[IU]/mL

## 2013-10-21 LAB — CREATININE, SERUM
CREATININE: 0.61 mg/dL (ref 0.50–1.10)
GFR calc Af Amer: >90 mL/min (ref >90)

## 2013-10-21 LAB — AST: AST: 25 U/L (ref 0–37)

## 2013-10-21 LAB — BUN: BUN: 8 mg/dL (ref 6–23)

## 2013-10-21 MED ORDER — METHOTREXATE INJECTION FOR WOMEN'S HOSPITAL
50.0000 mg/m2 | Freq: Once | INTRAMUSCULAR | Status: AC
Start: 2013-10-21 — End: 2013-10-21
  Administered 2013-10-21: 110 mg via INTRAMUSCULAR
  Filled 2013-10-21: qty 2.2

## 2013-10-21 NOTE — Patient Instructions (Signed)
Methotrexate Treatment for an Ectopic Pregnancy An ectopic pregnancy is when the fertilized egg attaches (implants) outside the uterus. Most ectopic pregnancies occur in the fallopian tube. Rarely do ectopic pregnancies occur on the ovary, intestine, pelvis, or cervix. An ectopic pregnancy does not have the ability to develop into a normal, healthy baby. Having an ectopic pregnancy can be a life-threatening experience. However, if the ectopic pregnancy is found early enough, it can be treated with a medicine. This medicine is called methotrexate. Methotrexate works by stopping the pregnancy from growing. It helps the body absorb the pregnancy tissue over a 2 to 6 week period (though most pregnancies will be absorbed by 3 weeks).  If methotrexate is successful, there is a good chance that the fallopian tube may be saved. Regardless of whether the fallopian tube is saved, a mother who has had an ectopic pregnancy is at a much higher risk of having another ectopic occur in future pregnancies. One serious concern is the potential for the fallopian tube to tear (rupture). If it does, emergency surgery is needed to remove the pregnancy, and methotrexate cannot be used. The ideal patient for methotrexate is a person who is:   Not bleeding internally.  Has no severe or persistent abdominal pain.  Is committed to following through with lab tests and appointments until the ectopic has absorbed.  Is healthy and has normal liver and kidney functions on evaluation. Methotrexate should not be given to women who:  Are breastfeeding.  Have a normal pregnancy (intrauterine pregnancy).  Have liver, lung, or kidney disease.  Have blood problems.  Are allergic to methotrexate.  Have peptic ulcers.  Have an ectopic pregnancy larger than 1 inches (3.5 cm) or one that has fetal heartbeats. This is a rule that is followed most of the time (relative contraindication). BEFORE THE TREATMENT Before giving the  medicine:  Liver tests, kidney tests, and a complete blood test are performed.  Blood tests are performed to measure the pregnancy hormone levels and to determine the mother's blood type.  If the woman is Rh negative, and the father is Rh positive or his Rh type is not known, a RhoGAM shot is given. TREATMENT  There are 2 methods that your caregiver may use to prescribe methotrexate. One method involves a single dose or injection of the medicine. Another method involves a series of doses. This method involves several injections.  AFTER THE TREATMENT Blood tests will be taken for several weeks to check the pregnancy hormone levels. The blood tests are performed until there is no more pregnancy hormone detected in the blood. There is still a risk of the ectopic pregnancy rupturing while using the methotrexate. There are also side effects of methotrexate, which include:   Nausea and vomiting.  Mouth sores.  Diarrhea.  Rash.  Dizziness.  Increased abdominal pain.  Increased vaginal bleeding or spotting.  Pneumonia.  Failed treatment.  Hair loss. This is rare and reversible. On very rare occasions, the medicine may affect your blood counts, liver, kidney, bone marrow, or hormone levels. If this happens, your caregiver will want to perform further evaluations. Document Released: 04/23/2001 Document Revised: 07/22/2011 Document Reviewed: 01/03/2011 [facility] Patient Information 2014 [place], [place].

## 2013-10-21 NOTE — MAU Note (Signed)
Pt sent from office for MTX injection.

## 2013-10-21 NOTE — Progress Notes (Signed)
   The patient presented to the office today for followup quantitative beta-hCG.Patient has been followed closely since it appears that she became hyperstimulated with her second dose of clomiphene citrate which had been managed her because of her history of oligomenorrhea and infertility. Evaluation as an assessment since May 14 as follows:   Quantitative hCGs: 09/23/2013 - 73  09/27/2013 - 55  10/01/2013 - 49  10/06/2013 - 46  11/09/2013 - 41  10/14/2013 At hospital Quant 35  10/19/2013-50  Quantitative beta-hCG June 11 had dropped to 39.6. Patient denies any abdominal pelvic pain or any vaginal bleeding.  Review of record as follows: 10/01/2013 had severe lower left quadrant pain, Hospital - CT 3 left ovarian cysts largest 3.8 x 2.9 cm, rt ovarian cyst 4.1 x 3.5 largest. Ultrasound showed a positive gestational sac without a yolk sac. Left ovarian cyst 7.6 x 4.3 x 3.8 cm cyst with internal debris, right ovary not reported on ultrasound report, question if cyst noted on wrong side.  10/14/2013 office visit patient was not having any pain that day minimal intermittent cramping and denied any bleeding. Ultrasound done in the office demonstrated the following:  Anteverted uterus displaced by right ovarian mass, endometrium NO gestational sac noted. Negative color flow. Right ovary enlarged echo-free cyst 35 x 32 mm, cystic solid mass 64 x 62 x 70 mm layering of debris with negative CFD increased from 5/22. Left ovary thickwalled cystic 31 x 26 mm internal lead level echoes negative CFD. Echogenic free fluid above uterus. Patient was not having any rebound or guarding her a tenderness.  Hemoglobin 10/01/2013 13.9  Hemoglobin 10/14/2013 14.3  Followup ultrasound 10/19/2013: Uterus measures 9.0 x 6.8 x 5.1 cm endometrial stripe 6.9 mm. No evidence of gestational sac. Avascular endometrium. Right ovary enlarged with cystic mass thin-walled measures 6.1 x 6.2 x 6.4 mm with solid echogenic focus measuring  29 x 44 mm with positive cold flow murmur of the cyst appears to be a blood clot centrally located. The echo-free cyst measuring 32 x 25 mm negative color flow. Left ovary thinwall cyst 31 x 25 mm, 16 x 16 mm, internal low level echo. A negative color flow. Echogenic free fluid noted above the uterus.   Exam: Patient in no acute distress Abdomen: Soft nontender no rebound or guarding Pelvic exam not done  Assessment/plan: Patient with clinical evidence of hyperstimulation of ovaries from clomiphene citrate and also with clinical evidence now and proven by laboratory data of miscarriage. Persistent quantitative beta-hCG has been slowly dropping and she is going to receive methotrexate today 50 mg per meter square. We will check her SGOT, BUN and creatinine today. She will return on day 4 for a quantitative beta hCG and on day 7. She will see me on the eighth day for followup. At that visit we will determine whether we will need to do her next ultrasound. Patient was counseled as to the risks benefits and pros and cons of methotrexate. She will use contraception pills she will stop her prenatal vitamins for now. If she has any unusual pain or bleeding she'll report to the office or the emergency room if after hours.

## 2013-10-22 ENCOUNTER — Telehealth: Payer: Self-pay | Admitting: Obstetrics & Gynecology

## 2013-10-22 NOTE — Telephone Encounter (Signed)
23 yo G1P0 female calling with concerns about low back pain after receiving Methotrexate injection yesterday at [facility] in [place].  Pt has inappropriately falling HCGs after having a miscarriage.  Ultrasound has not shown evidence of ectopic or IUP.  She does have an 8cm solid/cystic ovarian mass that has been followed now over a month thought to be hyperstimulation of ovary due to clomid use.   Pt reports pain is low in her back, the same as when she miscarried, although not as severe.  Hasn't taken anything for the pain.  Reports no one mentioned she would have pain, only possible cramping, but this doesn't feel like cramping.  Advised pain after Methotrexate is very common.  It is reassuring to me that is is the same pain (but milder) that she felt when she was having her miscarriage.  No light headedness.  No dizziness.    Precautions given for patient to go to MAU is pain worsens, if she has heavy vaginal bleeding, if she becomes lightheaded or dizzy.  Pt appreciative of phone call back.    MBT O+

## 2013-10-24 ENCOUNTER — Encounter (HOSPITAL_COMMUNITY): Payer: Self-pay | Admitting: *Deleted

## 2013-10-24 ENCOUNTER — Ambulatory Visit (HOSPITAL_COMMUNITY)
Admission: AD | Admit: 2013-10-24 | Discharge: 2013-10-25 | Disposition: A | Discharge status: Home / Self Care | Payer: BC Managed Care – PPO | Source: Ambulatory Visit | Attending: Obstetrics & Gynecology | Admitting: Obstetrics & Gynecology

## 2013-10-24 ENCOUNTER — Inpatient Hospital Stay (HOSPITAL_COMMUNITY)
Admission: AD | Admit: 2013-10-24 | Discharge: 2013-10-24 | Disposition: A | Discharge status: Home / Self Care | Payer: BC Managed Care – PPO | Source: Ambulatory Visit | Attending: Obstetrics & Gynecology | Admitting: Obstetrics & Gynecology

## 2013-10-24 ENCOUNTER — Inpatient Hospital Stay (HOSPITAL_COMMUNITY): Payer: BC Managed Care – PPO

## 2013-10-24 DIAGNOSIS — O039 Complete or unspecified spontaneous abortion without complication: Secondary | ICD-10-CM

## 2013-10-24 DIAGNOSIS — K661 Hemoperitoneum: Secondary | ICD-10-CM | POA: Insufficient documentation

## 2013-10-24 DIAGNOSIS — N7013 Chronic salpingitis and oophoritis: Secondary | ICD-10-CM | POA: Insufficient documentation

## 2013-10-24 DIAGNOSIS — N83209 Unspecified ovarian cyst, unspecified side: Secondary | ICD-10-CM | POA: Insufficient documentation

## 2013-10-24 DIAGNOSIS — N838 Other noninflammatory disorders of ovary, fallopian tube and broad ligament: Secondary | ICD-10-CM | POA: Insufficient documentation

## 2013-10-24 DIAGNOSIS — N83201 Unspecified ovarian cyst, right side: Secondary | ICD-10-CM

## 2013-10-24 DIAGNOSIS — N949 Unspecified condition associated with female genital organs and menstrual cycle: Secondary | ICD-10-CM | POA: Insufficient documentation

## 2013-10-24 DIAGNOSIS — N938 Other specified abnormal uterine and vaginal bleeding: Secondary | ICD-10-CM | POA: Insufficient documentation

## 2013-10-24 DIAGNOSIS — E039 Hypothyroidism, unspecified: Secondary | ICD-10-CM | POA: Insufficient documentation

## 2013-10-24 DIAGNOSIS — N83202 Unspecified ovarian cyst, left side: Secondary | ICD-10-CM

## 2013-10-24 DIAGNOSIS — R102 Pelvic and perineal pain: Secondary | ICD-10-CM

## 2013-10-24 LAB — COMPREHENSIVE METABOLIC PANEL
ALK PHOS: 137 U/L — AB (ref 39–117)
ALT: 31 U/L (ref 0–35)
AST: 27 U/L (ref 0–37)
Albumin: 3.1 g/dL — ABNORMAL LOW (ref 3.5–5.2)
BILIRUBIN TOTAL: 0.3 mg/dL (ref 0.3–1.2)
BUN: 9 mg/dL (ref 6–23)
CALCIUM: 8.8 mg/dL (ref 8.4–10.5)
CO2: 24 mEq/L (ref 19–32)
Chloride: 104 mEq/L (ref 96–112)
Creatinine, Ser: 0.68 mg/dL (ref 0.50–1.10)
GFR calc non Af Amer: >90 mL/min (ref >90)
GLUCOSE: 128 mg/dL — AB (ref 70–99)
Potassium: 4 mEq/L (ref 3.7–5.3)
Sodium: 139 mEq/L (ref 137–147)
Total Protein: 5.9 g/dL — ABNORMAL LOW (ref 6.0–8.3)

## 2013-10-24 LAB — URINALYSIS, ROUTINE W REFLEX MICROSCOPIC
BILIRUBIN URINE: NEGATIVE
Glucose, UA: NEGATIVE mg/dL
KETONES UR: 15 mg/dL — AB
NITRITE: POSITIVE — AB
PH: 7 (ref 5.0–8.0)
PROTEIN: 100 mg/dL — AB
Specific Gravity, Urine: <1.005 — ABNORMAL LOW (ref 1.005–1.030)
Urobilinogen, UA: 1 mg/dL (ref 0.0–1.0)

## 2013-10-24 LAB — CBC
HEMATOCRIT: 39.2 % (ref 36.0–46.0)
Hemoglobin: 13.8 g/dL (ref 12.0–15.0)
MCH: 32.2 pg (ref 26.0–34.0)
MCHC: 35.2 g/dL (ref 30.0–36.0)
MCV: 91.4 fL (ref 78.0–100.0)
PLATELETS: 234 10*3/uL (ref 150–400)
RBC: 4.29 MIL/uL (ref 3.87–5.11)
RDW: 13.5 % (ref 11.5–15.5)
WBC: 9.4 10*3/uL (ref 4.0–10.5)

## 2013-10-24 LAB — HCG, QUANTITATIVE, PREGNANCY: HCG, BETA CHAIN, QUANT, S: 34 m[IU]/mL — AB (ref <5)

## 2013-10-24 LAB — URINE MICROSCOPIC-ADD ON

## 2013-10-24 MED ORDER — KETOROLAC TROMETHAMINE 60 MG/2ML IM SOLN
60.0000 mg | Freq: Once | INTRAMUSCULAR | Status: AC
  Administered 2013-10-24: 60 mg via INTRAMUSCULAR
  Filled 2013-10-24: qty 2

## 2013-10-24 MED ORDER — OXYCODONE-ACETAMINOPHEN 5-325 MG PO TABS: 2.0000 | ORAL_TABLET | ORAL | Status: DC | PRN

## 2013-10-24 MED ORDER — OXYCODONE-ACETAMINOPHEN 5-325 MG PO TABS
1.0000 | ORAL_TABLET | Freq: Once | ORAL | Status: AC
  Administered 2013-10-24: 1 via ORAL
  Filled 2013-10-24: qty 1

## 2013-10-24 MED ORDER — LOPERAMIDE HCL 1 MG/5ML PO LIQD
2.0000 mg | Freq: Once | ORAL | Status: AC
  Administered 2013-10-24: 2 mg via ORAL
  Filled 2013-10-24: qty 10

## 2013-10-24 MED ORDER — PROMETHAZINE HCL 25 MG PO TABS
25.0000 mg | ORAL_TABLET | Freq: Once | ORAL | Status: AC
  Administered 2013-10-24: 25 mg via ORAL
  Filled 2013-10-24: qty 1

## 2013-10-24 MED ORDER — PROMETHAZINE HCL 25 MG PO TABS: 25.0000 mg | ORAL_TABLET | Freq: Four times a day (QID) | ORAL | Status: DC | PRN

## 2013-10-24 MED ORDER — HYDROMORPHONE HCL 2 MG PO TABS
2.0000 mg | ORAL_TABLET | Freq: Once | ORAL | Status: AC
  Administered 2013-10-24: 2 mg via ORAL
  Filled 2013-10-24: qty 1

## 2013-10-24 NOTE — MAU Provider Note (Signed)
History     CSN: [id number]  Arrival date and time: 10/24/13 8469   First Provider Initiated Contact with Patient 10/24/13 1024      Chief Complaint  Patient presents with  . Vaginal Bleeding   HPI Comments: Erika Armstrong 23 y.o. G1P0010 presents to  MAU with heavy vaginal bleeding and pain. She describes spotting yesterday and heavy bleeding today. She did not have a chance to change her pad today but came straight to MAU because the instructions told her to do that. She is on day 4 following MTX. The pain is 9/10 and tylenol is not helping. She has not been given any pain medications. She was on Clomid at time of pregnancy and developed multiple large cysts that are being followed by Dr Toney Rakes. Blood type O+  Vaginal Bleeding Associated symptoms include abdominal pain.      Past Medical History  Diagnosis Date  . Thyroid disease     Hypothyroid - pt states no med - saw 2nd opinion - neg   . Endometriosis   . Ovarian cyst   . Allergy   . Asthma     no inhaler - uses nebalizer  . Seasonal allergies   . Bronchitis     history - per pt  . Headache(784.0)     otc med prn  . Arthritis     wrist, knees - otc med prn  . Anxiety     and panic attacks  . Depression     Past Surgical History  Procedure Laterality Date  . Dermoid cyst  excision      right side  . Diagnostic laparoscopy    . Laparoscopy Left 10/08/2012    Procedure: LAPAROSCOPY OPERATIVE;REMOVAL OF [place] AND  REPLACEMENT OF [place];  Surgeon: Terrance Mass, MD;  Location: [facility];  Service: Gynecology;  Laterality: Left;  left upper arm and right upper arm  . I&d extremity Right 04/06/2013    Procedure: REMOVAL OF IMPLANT RIGHT ARM--IMPLANT DISCARDED;  Surgeon: Cammie Sickle., MD;  Location: [facility];  Service: Orthopedics;  Laterality: Right;  . Nexplanon removed      Family History  Problem Relation Age of Onset  . Heart disease Mother   . Hypertension Mother    . Hyperlipidemia Father   . Diabetes Other   . Hypertension Other   . Hypertension Maternal Grandmother   . COPD Maternal Grandmother   . COPD Maternal Grandfather   . Hypertension Paternal Grandmother   . Diabetes Paternal Grandmother   . COPD Paternal Grandfather   . Cancer Sister     History  Substance Use Topics  . Smoking status: Never Smoker   . Smokeless tobacco: Never Used  . Alcohol Use: No     Comment: social -- 3-4 times per year     Allergies:  Allergies  Allergen Reactions  . Seroquel [Quetiapine Fumerate] Swelling    Mouth numbness  . Tape Rash    Prescriptions prior to admission  Medication Sig Dispense Refill  . Prenatal Vit-Fe Fumarate-FA (PRENATAL MULTIVITAMIN) TABS tablet Take 1 tablet by mouth daily at 12 noon.        Review of Systems  Constitutional: Negative.   HENT: Negative.   Eyes: Negative.   Respiratory: Negative.   Cardiovascular: Negative.   Gastrointestinal: Positive for abdominal pain.  Genitourinary: Positive for vaginal bleeding.       Vaginal bleeding  Musculoskeletal: Negative.   Skin: Negative.   Neurological:  Negative.   Psychiatric/Behavioral: Negative.    Physical Exam   Blood pressure 130/81, pulse 79, temperature 98.2 F (36.8 C), temperature source Oral, resp. rate 16, height 5\' 6"  (1.676 m), weight 95.255 kg (210 lb), last menstrual period 08/18/2013, unknown if currently breastfeeding.  Physical Exam  Constitutional: She is oriented to person, place, and time. She appears well-developed and well-nourished. No distress.  HENT:  Head: Normocephalic and atraumatic.  Eyes: Pupils are equal, round, and reactive to light.  GI: Soft. Bowel sounds are normal. She exhibits no distension. There is tenderness. There is no rebound and no guarding.  Genitourinary:  Genital:External negative Vaginal: moderate amount blood Cervix:closed/ thick Bimanual:tenderness   Musculoskeletal: Normal range of motion.  Neurological:  She is alert and oriented to person, place, and time.  Skin: Skin is warm.  Psychiatric: She has a normal mood and affect. Her behavior is normal. Judgment and thought content normal.   Results for orders placed during the hospital encounter of 10/24/13 (from the past 24 hour(s))  URINALYSIS, ROUTINE W REFLEX MICROSCOPIC     Status: Abnormal   Collection Time    10/24/13 10:29 AM      Result Value Ref Range   Color, Urine RED (*) YELLOW   APPearance TURBID (*) CLEAR   Specific Gravity, Urine <1.005 (*) 1.005 - 1.030   pH 7.0  5.0 - 8.0   Glucose, UA NEGATIVE  NEGATIVE mg/dL   Hgb urine dipstick LARGE (*) NEGATIVE   Bilirubin Urine NEGATIVE  NEGATIVE   Ketones, ur 15 (*) NEGATIVE mg/dL   Protein, ur 100 (*) NEGATIVE mg/dL   Urobilinogen, UA 1.0  0.0 - 1.0 mg/dL   Nitrite POSITIVE (*) NEGATIVE   Leukocytes, UA MODERATE (*) NEGATIVE  URINE MICROSCOPIC-ADD ON     Status: Abnormal   Collection Time    10/24/13 10:29 AM      Result Value Ref Range   Squamous Epithelial / LPF FEW (*) RARE   WBC, UA 3-6  <3 WBC/hpf   RBC / HPF TOO NUMEROUS TO COUNT  <3 RBC/hpf   Bacteria, UA FEW (*) RARE  HCG, QUANTITATIVE, PREGNANCY     Status: Abnormal   Collection Time    10/24/13 10:45 AM      Result Value Ref Range   hCG, Beta Chain, Quant, S 34 (*) <5 mIU/mL  CBC     Status: None   Collection Time    10/24/13 10:45 AM      Result Value Ref Range   WBC 9.4  4.0 - 10.5 K/uL   RBC 4.29  3.87 - 5.11 MIL/uL   Hemoglobin 13.8  12.0 - 15.0 g/dL   HCT 39.2  36.0 - 46.0 %   MCV 91.4  78.0 - 100.0 fL   MCH 32.2  26.0 - 34.0 pg   MCHC 35.2  30.0 - 36.0 g/dL   RDW 13.5  11.5 - 15.5 %   Platelets 234  150 - 400 K/uL   Lab Results  Component Value Date   HCGBETAQNT 34* 10/24/2013   HCGBETAQNT 39.6 10/21/2013   HCGBETAQNT 50.1 10/18/2013      MAU Course  Procedures  MDM  Toradol 60 mg IM now/ pain went down to 5 Percocet/ phenergan Repeat Quant Urine culture Patient and husband are upset  because she is bleeding/ spent 20+ minutes with them answering questions  Reviewed case with Dr Sabra Heck who felt it was ok to send home with pain medications and follow  up with Dr Manya Silvas in morning  Assessment and Plan   A: Pelvic Pain with ovarian cysts Follow up day 4 MTX  P: Above orders Home with percocet/ phenergan Note for work few days Return Wednesday for BHCG day 7 MTX Follow up with Dr Toney Rakes in morning  Georgia Duff 10/24/2013, 10:47 AM

## 2013-10-24 NOTE — MAU Note (Signed)
Pt. Here due to increased pain. Took pain medication that was prescribed today around 2030. Also, took phenergan around the same time. Bleeding is better than earlier. Reports nausea and vomiting.

## 2013-10-24 NOTE — MAU Note (Signed)
Pt presents to MAU with complaints of heavy vaginal bleeding with an increase in pain. She says she is suppose to follow up today for a repeat QUANT because she had methotrexate on Thursday.

## 2013-10-24 NOTE — Discharge Instructions (Signed)
Ovarian Cyst An ovarian cyst is a sac filled with fluid or blood. This sac is attached to the ovary. Some cysts go away on their own. Other cysts need treatment.  HOME CARE   Only take medicine as told by your doctor.  Follow up with your doctor as told.  Get regular pelvic exams and Pap tests. GET HELP IF:  Your periods are late, not regular, or painful.  You stop having periods.  Your belly (abdominal) or pelvic pain does not go away.  Your belly becomes large or puffy (swollen).  You have a hard time peeing (totally emptying your bladder).  You have pressure on your bladder.  You have pain during sex.  You feel fullness, pressure, or discomfort in your belly.  You lose weight for no reason.  You feel sick most of the time.  You have a hard time pooping (constipation).  You do not feel like eating.  You develop pimples (acne).  You have an increase in hair on your body and face.  You are gaining weight for no reason.  You think you are pregnant. GET HELP RIGHT AWAY IF:   Your belly pain gets worse.  You feel sick to your stomach (nauseous), and you throw up (vomit).  You have a fever that comes on fast.  You have belly pain while pooping (bowel movement).  Your periods are heavier than usual. MAKE SURE YOU:   Understand these instructions.  Will watch your condition.  Will get help right away if you are not doing well or get worse. Document Released: 10/16/2007 Document Revised: 02/17/2013 Document Reviewed: 01/04/2013 [facility] Patient Information 2014 [place].

## 2013-10-25 ENCOUNTER — Telehealth: Payer: Self-pay | Admitting: Obstetrics & Gynecology

## 2013-10-25 ENCOUNTER — Encounter (HOSPITAL_COMMUNITY): Payer: BC Managed Care – PPO | Admitting: Anesthesiology

## 2013-10-25 ENCOUNTER — Inpatient Hospital Stay (HOSPITAL_COMMUNITY): Payer: BC Managed Care – PPO | Admitting: Anesthesiology

## 2013-10-25 ENCOUNTER — Encounter (HOSPITAL_COMMUNITY)
Admission: AD | Disposition: A | Discharge status: Home / Self Care | Payer: Self-pay | Source: Ambulatory Visit | Attending: Obstetrics & Gynecology

## 2013-10-25 ENCOUNTER — Telehealth: Payer: Self-pay | Admitting: *Deleted

## 2013-10-25 ENCOUNTER — Telehealth: Payer: Self-pay

## 2013-10-25 ENCOUNTER — Other Ambulatory Visit: Payer: BC Managed Care – PPO

## 2013-10-25 DIAGNOSIS — K661 Hemoperitoneum: Secondary | ICD-10-CM

## 2013-10-25 DIAGNOSIS — N949 Unspecified condition associated with female genital organs and menstrual cycle: Secondary | ICD-10-CM

## 2013-10-25 DIAGNOSIS — O009 Unspecified ectopic pregnancy without intrauterine pregnancy: Secondary | ICD-10-CM

## 2013-10-25 HISTORY — PX: LAPAROSCOPY: SHX197

## 2013-10-25 HISTORY — PX: DILATION AND CURETTAGE OF UTERUS: SHX78

## 2013-10-25 LAB — URINE CULTURE: Colony Count: 25000

## 2013-10-25 LAB — CBC
HCT: 40.4 % (ref 36.0–46.0)
HEMOGLOBIN: 13.7 g/dL (ref 12.0–15.0)
MCH: 31.4 pg (ref 26.0–34.0)
MCHC: 33.9 g/dL (ref 30.0–36.0)
MCV: 92.4 fL (ref 78.0–100.0)
PLATELETS: 224 10*3/uL (ref 150–400)
RBC: 4.37 MIL/uL (ref 3.87–5.11)
RDW: 13.5 % (ref 11.5–15.5)
WBC: 21.6 10*3/uL — ABNORMAL HIGH (ref 4.0–10.5)

## 2013-10-25 LAB — TYPE AND SCREEN
ABO/RH(D): O POS
ANTIBODY SCREEN: NEGATIVE

## 2013-10-25 SURGERY — LAPAROSCOPY, DIAGNOSTIC
Anesthesia: General | Site: Vagina

## 2013-10-25 MED ORDER — PROMETHAZINE HCL 25 MG/ML IJ SOLN: 6.2500 mg | INTRAMUSCULAR | Status: DC | PRN

## 2013-10-25 MED ORDER — METHYLENE BLUE 1 % INJ SOLN
INTRAMUSCULAR | Status: AC
  Filled 2013-10-25: qty 10

## 2013-10-25 MED ORDER — MEPERIDINE HCL 25 MG/ML IJ SOLN: 6.2500 mg | INTRAMUSCULAR | Status: DC | PRN

## 2013-10-25 MED ORDER — DEXAMETHASONE SODIUM PHOSPHATE 10 MG/ML IJ SOLN
INTRAMUSCULAR | Status: DC | PRN
  Administered 2013-10-25: 10 mg via INTRAVENOUS

## 2013-10-25 MED ORDER — FENTANYL CITRATE 0.05 MG/ML IJ SOLN
INTRAMUSCULAR | Status: DC | PRN
  Administered 2013-10-25 (×2): 100 ug via INTRAVENOUS
  Administered 2013-10-25 (×3): 50 ug via INTRAVENOUS

## 2013-10-25 MED ORDER — SODIUM CHLORIDE 0.9 % IJ SOLN: 3.0000 mL | INTRAMUSCULAR | Status: DC | PRN

## 2013-10-25 MED ORDER — FAMOTIDINE IN NACL 20-0.9 MG/50ML-% IV SOLN
20.0000 mg | Freq: Once | INTRAVENOUS | Status: AC
  Administered 2013-10-25: 20 mg via INTRAVENOUS
  Filled 2013-10-25: qty 50

## 2013-10-25 MED ORDER — ONDANSETRON 8 MG/NS 50 ML IVPB
8.0000 mg | Freq: Once | INTRAVENOUS | Status: AC
  Administered 2013-10-25: 8 mg via INTRAVENOUS
  Filled 2013-10-25: qty 8

## 2013-10-25 MED ORDER — SUCCINYLCHOLINE CHLORIDE 20 MG/ML IJ SOLN
INTRAMUSCULAR | Status: DC | PRN
  Administered 2013-10-25: 120 mg via INTRAVENOUS

## 2013-10-25 MED ORDER — NEOSTIGMINE METHYLSULFATE 10 MG/10ML IV SOLN
INTRAVENOUS | Status: AC
  Filled 2013-10-25: qty 1

## 2013-10-25 MED ORDER — LIDOCAINE HCL (CARDIAC) 20 MG/ML IV SOLN
INTRAVENOUS | Status: DC | PRN
  Administered 2013-10-25: 100 mg via INTRAVENOUS

## 2013-10-25 MED ORDER — GLYCOPYRROLATE 0.2 MG/ML IJ SOLN
INTRAMUSCULAR | Status: DC | PRN
  Administered 2013-10-25: 0.6 mg via INTRAVENOUS

## 2013-10-25 MED ORDER — MEPERIDINE HCL 25 MG/ML IJ SOLN
INTRAMUSCULAR | Status: DC | PRN
  Administered 2013-10-25: 12.5 mg via INTRAVENOUS

## 2013-10-25 MED ORDER — FENTANYL CITRATE 0.05 MG/ML IJ SOLN
INTRAMUSCULAR | Status: AC
  Filled 2013-10-25: qty 5

## 2013-10-25 MED ORDER — KETOROLAC TROMETHAMINE 30 MG/ML IJ SOLN
INTRAMUSCULAR | Status: DC | PRN
  Administered 2013-10-25: 30 mg via INTRAMUSCULAR
  Administered 2013-10-25: 30 mg via INTRAVENOUS

## 2013-10-25 MED ORDER — ACETAMINOPHEN 650 MG RE SUPP
650.0000 mg | RECTAL | Status: DC | PRN
  Filled 2013-10-25: qty 1

## 2013-10-25 MED ORDER — LACTATED RINGERS IV SOLN: INTRAVENOUS | Status: DC

## 2013-10-25 MED ORDER — OXYCODONE HCL 5 MG PO TABS: 5.0000 mg | ORAL_TABLET | ORAL | Status: DC | PRN

## 2013-10-25 MED ORDER — GLYCOPYRROLATE 0.2 MG/ML IJ SOLN
INTRAMUSCULAR | Status: AC
  Filled 2013-10-25: qty 3

## 2013-10-25 MED ORDER — NEOSTIGMINE METHYLSULFATE 10 MG/10ML IV SOLN
INTRAVENOUS | Status: DC | PRN
  Administered 2013-10-25: 4 mg via INTRAVENOUS

## 2013-10-25 MED ORDER — SODIUM CHLORIDE 0.9 % IV SOLN: 250.0000 mL | INTRAVENOUS | Status: DC | PRN

## 2013-10-25 MED ORDER — SODIUM CHLORIDE 0.9 % IJ SOLN
INTRAMUSCULAR | Status: DC | PRN
  Administered 2013-10-25: 10 mL via INTRAVENOUS

## 2013-10-25 MED ORDER — METHYLENE BLUE 1 % INJ SOLN
INTRAMUSCULAR | Status: DC | PRN
  Administered 2013-10-25: 10 mL via SUBMUCOSAL

## 2013-10-25 MED ORDER — LACTATED RINGERS IV SOLN
INTRAVENOUS | Status: DC | PRN
  Administered 2013-10-25: 04:00:00 via INTRAVENOUS

## 2013-10-25 MED ORDER — ACETAMINOPHEN 325 MG PO TABS: 650.0000 mg | ORAL_TABLET | ORAL | Status: DC | PRN

## 2013-10-25 MED ORDER — MEPERIDINE HCL 25 MG/ML IJ SOLN
INTRAMUSCULAR | Status: AC
  Filled 2013-10-25: qty 1

## 2013-10-25 MED ORDER — OXYCODONE-ACETAMINOPHEN 5-325 MG PO TABS
ORAL_TABLET | ORAL | Status: AC
  Filled 2013-10-25: qty 1

## 2013-10-25 MED ORDER — LIDOCAINE HCL (CARDIAC) 20 MG/ML IV SOLN
INTRAVENOUS | Status: AC
  Filled 2013-10-25: qty 5

## 2013-10-25 MED ORDER — FAMOTIDINE IN NACL 20-0.9 MG/50ML-% IV SOLN: 20.0000 mg | Freq: Once | INTRAVENOUS | Status: DC

## 2013-10-25 MED ORDER — METOCLOPRAMIDE HCL 5 MG/ML IJ SOLN
INTRAMUSCULAR | Status: DC | PRN
Start: 2013-10-25 — End: 2013-10-25
  Administered 2013-10-25: 10 mg via INTRAVENOUS

## 2013-10-25 MED ORDER — ROCURONIUM BROMIDE 100 MG/10ML IV SOLN
INTRAVENOUS | Status: AC
  Filled 2013-10-25: qty 1

## 2013-10-25 MED ORDER — BUPIVACAINE HCL (PF) 0.25 % IJ SOLN
INTRAMUSCULAR | Status: DC | PRN
  Administered 2013-10-25: 9 mL

## 2013-10-25 MED ORDER — OXYCODONE-ACETAMINOPHEN 5-325 MG PO TABS: 2.0000 | ORAL_TABLET | ORAL | Status: DC | PRN

## 2013-10-25 MED ORDER — FENTANYL CITRATE 0.05 MG/ML IJ SOLN
INTRAMUSCULAR | Status: AC
  Filled 2013-10-25: qty 2

## 2013-10-25 MED ORDER — OXYCODONE-ACETAMINOPHEN 5-325 MG PO TABS
1.0000 | ORAL_TABLET | ORAL | Status: DC | PRN
  Administered 2013-10-25: 1 via ORAL

## 2013-10-25 MED ORDER — CEFAZOLIN SODIUM-DEXTROSE 2-3 GM-% IV SOLR
INTRAVENOUS | Status: DC | PRN
  Administered 2013-10-25: 2 g via INTRAVENOUS

## 2013-10-25 MED ORDER — PROPOFOL 10 MG/ML IV EMUL
INTRAVENOUS | Status: AC
  Filled 2013-10-25: qty 20

## 2013-10-25 MED ORDER — MIDAZOLAM HCL 2 MG/2ML IJ SOLN
2.0000 mg | Freq: Once | INTRAMUSCULAR | Status: AC
  Administered 2013-10-25: 2 mg via INTRAVENOUS

## 2013-10-25 MED ORDER — SODIUM CHLORIDE 0.9 % IJ SOLN: 3.0000 mL | Freq: Two times a day (BID) | INTRAMUSCULAR | Status: DC

## 2013-10-25 MED ORDER — PROPOFOL 10 MG/ML IV BOLUS
INTRAVENOUS | Status: DC | PRN
  Administered 2013-10-25: 200 mg via INTRAVENOUS

## 2013-10-25 MED ORDER — CITRIC ACID-SODIUM CITRATE 334-500 MG/5ML PO SOLN
30.0000 mL | Freq: Once | ORAL | Status: AC
  Administered 2013-10-25: 30 mL via ORAL
  Filled 2013-10-25: qty 15

## 2013-10-25 MED ORDER — ROCURONIUM BROMIDE 100 MG/10ML IV SOLN
INTRAVENOUS | Status: DC | PRN
  Administered 2013-10-25: 30 mg via INTRAVENOUS

## 2013-10-25 MED ORDER — FENTANYL CITRATE 0.05 MG/ML IJ SOLN: 25.0000 ug | INTRAMUSCULAR | Status: DC | PRN

## 2013-10-25 SURGICAL SUPPLY — 30 items
ADH SKN CLS APL DERMABOND .7 (GAUZE/BANDAGES/DRESSINGS) ×2
APPLICATOR COTTON TIP 6IN STRL (MISCELLANEOUS) ×2 IMPLANT
CATH ROBINSON RED A/P 16FR (CATHETERS) IMPLANT
CHLORAPREP W/TINT 26ML (MISCELLANEOUS) ×4 IMPLANT
CLOTH BEACON ORANGE TIMEOUT ST (SAFETY) ×4 IMPLANT
DERMABOND ADVANCED (GAUZE/BANDAGES/DRESSINGS) ×2
DERMABOND ADVANCED .7 DNX12 (GAUZE/BANDAGES/DRESSINGS) ×2 IMPLANT
DRESSING OPSITE X SMALL 2X3 (GAUZE/BANDAGES/DRESSINGS) ×2 IMPLANT
DRSG COVADERM PLUS 2X2 (GAUZE/BANDAGES/DRESSINGS) ×6 IMPLANT
DRSG OPSITE POSTOP 3X4 (GAUZE/BANDAGES/DRESSINGS) IMPLANT
GLOVE BIOGEL PI IND STRL 7.0 (GLOVE) ×2 IMPLANT
GLOVE BIOGEL PI INDICATOR 7.0 (GLOVE) ×2
GLOVE ECLIPSE 6.5 STRL STRAW (GLOVE) ×8 IMPLANT
NEEDLE INSUFFLATION 120MM (ENDOMECHANICALS) ×4 IMPLANT
PACK LAPAROSCOPY BASIN (CUSTOM PROCEDURE TRAY) ×4 IMPLANT
PROTECTOR NERVE ULNAR (MISCELLANEOUS) ×4 IMPLANT
SEALER TISSUE G2 CVD JAW 35 (ENDOMECHANICALS) IMPLANT
SEALER TISSUE G2 CVD JAW 45CM (ENDOMECHANICALS)
SET IRRIG TUBING LAPAROSCOPIC (IRRIGATION / IRRIGATOR) IMPLANT
SUT VIC AB 4-0 SH 27 (SUTURE)
SUT VIC AB 4-0 SH 27XANBCTRL (SUTURE) IMPLANT
SUT VICRYL 0 UR6 27IN ABS (SUTURE) IMPLANT
SUT VICRYL 4-0 PS2 18IN ABS (SUTURE) ×4 IMPLANT
TOWEL OR 17X24 6PK STRL BLUE (TOWEL DISPOSABLE) ×8 IMPLANT
TRAY FOLEY CATH 14FR (SET/KITS/TRAYS/PACK) ×2 IMPLANT
TROCAR BALLN 12MMX100 BLUNT (TROCAR) IMPLANT
TROCAR XCEL NON-BLD 11X100MML (ENDOMECHANICALS) ×2 IMPLANT
TROCAR XCEL NON-BLD 5MMX100MML (ENDOMECHANICALS) ×4 IMPLANT
WARMER LAPAROSCOPE (MISCELLANEOUS) ×4 IMPLANT
WATER STERILE IRR 1000ML POUR (IV SOLUTION) ×4 IMPLANT

## 2013-10-25 NOTE — Telephone Encounter (Signed)
Pt called to let you know she had surgery last night , see Dr.Miller note. Has appointment scheduled with you on 10/28/13 for follow up after meds, she asked if keep scheduled appointment or schedule something sooner? Please advise

## 2013-10-25 NOTE — Anesthesia Postprocedure Evaluation (Signed)
  Anesthesia Post-op Note  Patient: Erika Armstrong  Procedure(s) Performed: Procedure(s) (LRB): LAPAROSCOPY DIAGNOSTIC WITH REMOVAL OF RIGHT PARATUBAL CYST, CHROMOPERTUBATION, RIGHT OVARIAN CYST DRAINAGE, left hydrosalpinx,lysis of adhesions (N/A) DILATATION AND CURETTAGE  Patient Location: PACU  Anesthesia Type: General  Level of Consciousness: awake and alert   Airway and Oxygen Therapy: Patient Spontanous Breathing  Post-op Pain: mild  Post-op Assessment: Post-op Vital signs reviewed, Patient's Cardiovascular Status Stable, Respiratory Function Stable, Patent Airway and No signs of Nausea or vomiting  Last Vitals:  Filed Vitals:   10/25/13 0645  BP: 133/55  Pulse: 82  Temp:   Resp: 16    Post-op Vital Signs: stable   Complications: No apparent anesthesia complications

## 2013-10-25 NOTE — Transfer of Care (Signed)
Immediate Anesthesia Transfer of Care Note  Patient: Shawn Route  Procedure(s) Performed: Procedure(s): LAPAROSCOPY DIAGNOSTIC WITH REMOVAL OF RIGHT PARATUBAL CYST, CHROMOPERTUBATION, RIGHT OVARIAN CYST DRAINAGE, left hydrosalpinx,lysis of adhesions (N/A) DILATATION AND CURETTAGE  Patient Location: PACU  Anesthesia Type:General  Level of Consciousness: awake and alert   Airway & Oxygen Therapy: Patient Spontanous Breathing and Patient connected to nasal cannula oxygen  Post-op Assessment: Report given to PACU RN and Post -op Vital signs reviewed and stable  Post vital signs: Reviewed and stable  Complications: No apparent anesthesia complications

## 2013-10-25 NOTE — Telephone Encounter (Signed)
Pt will keep schedule appointment on 10/28/13

## 2013-10-25 NOTE — Op Note (Signed)
10/24/2013 - 10/25/2013  6:08 AM  PATIENT:  Erika Armstrong  23 y.o. female  PRE-OPERATIVE DIAGNOSIS:  possible ovarian cyst, possible ectopic pregnancy, hemoperitoneum  POST-OPERATIVE DIAGNOSIS:  HEMOPERITONEUM, BILATERAL OVARIAN CYSTS, LEFT HYDROSALPINX, RIGHT PARATUBAL CYST, STAUTUS POST METHOTREXATE  PROCEDURE:  Procedure(s): LAPAROSCOPY DIAGNOSTIC WITH REMOVAL OF RIGHT PARATUBAL CYST, CHROMOPERTUBATION, RIGHT OVARIAN CYST DRAINAGE, left hydrosalpinx,lysis of adhesions DILATATION AND CURETTAGE  SURGEON:  Wilbert Hayashi SUZANNE  ASSISTANTS: OR staff   ANESTHESIA:   general  ESTIMATED BLOOD LOSS: 300cc irrigated out of pelvis at beginning of case.  No signficant blood loss during the procedure.  BLOOD ADMINISTERED:none   FLUIDS: 1500ccLR  UOP: 550 cc clear UOP  SPECIMEN:  Right paratubal cyst, endometrial curettings  DISPOSITION OF SPECIMEN:  PATHOLOGY  FINDINGS: hemoperitoneum, mildly dilated left fallopian tube, large right ovarian cyst, smaller left ovarian cyst, right paratubal cyst, left adhesions of ovary to fallopian tube  DESCRIPTION OF OPERATION:  Patient was taken to the operating room. She was placed in the supine position. Her right arm was extended with a functioning IV in place. Her left arm was tucked and an arm board was placed underneath to support the arm. General endotracheal anesthesia was administered by the anesthesia staff without difficulty. Her legs were placed in the [place] in the low lithotomy position. Sequential compression devices were on her calves and functioning properly.  Once adequate anesthesia was confirmed, the abdomen was prepped with chlor prep and the perineum, inner thighs, and vagina were prepped with Betadine. 3 minutes past for the patient was draped in a normal standard fashion. Attention was turned the vagina. A bivalve speculum was placed in the vagina and the anterior lip of the cervix was grasped with a single-tooth  tenaculum. The uterus sounded to 9 cm. A Hulka clamp was advanced through the cervical os and attached to the anterior lip of the cervix as a means of manipulating uterus during the procedure. The speculum was removed. A Foley catheter was placed to straight drain. Legs were lowered and attention was turned abdomen.  Quarter percent Marcaine was used to anesthetize the skin the need the umbilicus. The subcutaneous fat and tissue was dissected with hemostat. The abdomen was elevated and a Veress needle was passed through the fascia and peritoneum. This was heard as a pop. A syringe of normal saline was attached. An aspiration was performed. No blood or fluid was noted. Fluid injected easily and again aspiration was performed without any blood or fluid being noted. Fluid dripped easily down the needle. Then the pneumoperitoneum was achieved with low flow of CO2 gas. Once 4 L of gas was in the abdomen the needle was removed. A 10 mm non-bladed Optiview trocar port were obtained. Under direct visualization of the laparoscope this was passed into the abdomen without difficulty. Next  The pelvis and upper abdomen was surveyed. There was frank blood in the pelvis. This was extending up and the anterior cul-de-sac and out of the true pelvis.  The upper abdomen was surveyed and this was normal. There was no blood in the paracolic gutters.  Normal appendix was noted. Port site locations for right and left lower quadrant incisions were identified. The skin was anesthetized with quarter percent Marcaine and nicked with a #11 blade. 5 mm non-bladed trocar ports were passed directly into the abdomen under direct visualization of the laparoscope without difficulty. A Ne[place] suction irrigator was used to all the bladder of the pelvis. As approximately 250 to 300 cc.  The entire pelvis was surveyed. Although there was frank blood present it was not actively coming from either fallopian tube and there was no evidence of a  ruptured cyst on either ovary. There were some adhesions from the left fallopian tube to the left ovary. These were dissected sharply. There was a large paratubal cyst on the right side was excised as well. This was no clear evidence of ectopic were active bleeding in the ovary decision was made to perform chromopertubation and see if bypass to both tubes bilaterally.   Attention was turned back to the vagina where the Hulka clamp was removed and an acorn uterine manipulator was passed through the cervix and attached to the tenaculum. A methylene blue solution was injected and dye was seen easily coming from each tube. The left fallopian tube is mildly dilated consistent with a mild hydrosalpinx.  Because of her age and no active bleeding and no clear evidence of an ectopic and that tube I decided to leave the fallopian tube on the left. The right fallopian tube appear completely normal.  The right ovarian cyst was opened and drained of all fluid. No active bleeding was noted. The abdomen was watched for several minutes with no evidence of any additional bleeding. Decision made to in this portion of the procedure. Laparoscopic entrance were removed. The inferior port sites were removed under direct visualization. The patient was taken out of Trendelenburg. The blood was irrigated that had tipped over the true pelvis when she is placed in Trendelenburg.  Again the fallopian tubes and ovaries were visualized one final time and there is no bleeding noted. At this point the pneumoperitoneum was relieved. The CRNA give the patient 3 good deep breaths trying to any gas out of the abdomen and in the midline port was removed.  Fascia was closed at the midline with figure-of-eight suture of #0 Vicryl. The skin was then closed with a subcuticular stitch of 3-0 Vicryl. The skin was cleansed and Dermabond was applied. Legs are lifted to the high lithotomy position and attention was turned back to the vagina. The acorn  uterine manipulator was removed. The cervix is dilated up to #27. Using #1 smooth curette endometrial cavity was curetted to a rough grey tissue is noted in all quadrants. Specimen was handed off. The tenaculum was removed. There is no active bleeding from the anterior lip of the cervix. The speculum was removed from the vagina. At this point the procedure was ended. The patient was placed back in the supine position. Sponge, lap, needle, instruments counts were correct x2. 30 mg IV and IM portals give the patient. Patient was awake from anesthesia and extubated without difficulty. She was taken to the recovery room in stable condition.  COUNTS:  YES  PLAN OF CARE: Transfer to PACU

## 2013-10-25 NOTE — Discharge Instructions (Signed)
Post-surgical Instructions, Outpatient Surgery  You may expect to feel dizzy, weak, and drowsy for as long as 24 hours after receiving the medicine that made you sleep (anesthetic). For the first 24 hours after your surgery:    Do not drive a car, ride a bicycle, participate in physical activities, or take public transportation until you are done taking narcotic pain medicines or as directed by Dr. Sabra Heck.   Do not drink alcohol or take tranquilizers.   Do not take medicine that has not been prescribed by your physicians.   Do not sign important papers or make important decisions while on narcotic pain medicines.   Have a responsible person with you.   CARE OF INCISION  You can remove the coverings over the lower two incisions in 24 hours.  The dressing on the belly button soul be left for 72 hours.  You can shower with the dressing in place.  You have dermabond on your incisions.  It will take 10 days or so for this to become loose where you can peel it off.  You may shower on the first day after your surgery.  Do not sit in a tub bath for one week.  Avoid heavy lifting (more than 10 pounds/4.5 kilograms), pushing, or pulling.   Avoid activities that may risk injury to your incisions.   PAIN MANAGEMENT  Motrin 800mg .  (This is the same as 4-200mg  over the counter tablets of Motrin or ibuprofen.)  You may take this every eight hours or as needed for cramping.    Percocet 5/235 1-2 tabs every 4-6 hours.  For more severe pain, take one or two tablets every four to six hours as needed for pain control.  (Remember that narcotic pain medications increase your risk of constipation.  If this becomes a problem, you may take an over the counter stool softener like Colace 100mg  up to four times a day.)  DO'S AND DON'T'S  Do not take a tub bath for one week.  You may shower on the first day after your surgery  Do not do any heavy lifting for one to two weeks.  This increases the chance of  bleeding.  Do move around as you feel able.  Stairs are fine.  You may begin to exercise again as you feel able.  Do not lift any weights for two weeks.  Do not put anything in the vagina for two weeks--no tampons, intercourse, or douching.    REGULAR MEDIATIONS/VITAMINS:  You may restart all of your regular medications as prescribed.  You may restart all of your vitamins as you normally take them.    PLEASE CALL OR SEEK MEDICAL CARE IF:  You have persistent nausea and vomiting.   You have trouble eating or drinking.   You have an oral temperature above 100.5.   You have constipation that is not helped by adjusting diet or increasing fluid intake. Pain medicines are a common cause of constipation.   You have heavy vaginal bleeding  You have redness or drainage from your incision(s) or there is increasing pain or tenderness near or in the surgical site.

## 2013-10-25 NOTE — Telephone Encounter (Signed)
Patient advised to keep lab appt and office visit as scheduled. Dr. Moshe Salisbury will speak with her about note on Thursday.

## 2013-10-25 NOTE — Telephone Encounter (Signed)
Pt called around 10:30pm crying reporting her "stomach was torn up", she'd had diarrhea for several hours, she was bleeding and in pain.  She states "nobody is doing anything for me".  Call lost three times while I was talking to patient so getting a complete overview of the day was difficult due to connection.  She is not having dizziness or lightheadedness.  She is not having palpitations.  She "just doesn't feel good" and she thinks this is not normal.  Advised of common methotrexate side effects--increased pain and bleeding as well as nausea and diarrhea.  Pt extremely upset by [facility] visit earlier in the day.  Pt went to [facility] mid morning after seeing some vaginal bleeding.  She did not call me at that time.  Hb was stable.  VSS.  [facility] nurse reported exam was benign and she felt pt had overreacted.  Pt reports she felt "blown off" and like she was a bother when she first came to [facility].  Again states "no one is doing anything for me".  Although I feel all of her symptoms could be explained by the Methotrexate, advised pt if she feels like something is wrong, she needs to be seen again at hospital.  Pt initially states she is not going but then decides to go because she "feels so bad".  Called to [facility] and gave thorough hx as obtained from chart.  Advised to do U/S tonight as well.  [facility] nurse states I will be called with results after testing done.

## 2013-10-25 NOTE — Telephone Encounter (Signed)
Patient called reporting that she had surgery this morning with Dr. On call. She is asking what she should do as far as follow up with Dr. Moshe Salisbury and a note for work. Dr. Moshe Salisbury rec she come for lab as planned on Weds and keep the Thursday appt she has scheduled. He will give her note for work when she comes in on Thursday.  Patient advised and is fine with that plan.

## 2013-10-25 NOTE — Anesthesia Procedure Notes (Signed)
Procedure Name: Intubation Date/Time: 10/25/2013 4:30 AM Performed by: Mehdi Gironda, Sheron Nightingale Pre-anesthesia Checklist: Patient identified, Emergency Drugs available, Timeout performed, Suction available and Patient being monitored Patient Re-evaluated:Patient Re-evaluated prior to inductionOxygen Delivery Method: Circle system utilized Preoxygenation: Pre-oxygenation with 100% oxygen Intubation Type: IV induction Ventilation: Mask ventilation without difficulty Tube type: Oral Tube size: 7.0 mm Number of attempts: 1 Placement Confirmation: ETT inserted through vocal cords under direct vision and positive ETCO2 Secured at: 22 cm Dental Injury: Teeth and Oropharynx as per pre-operative assessment

## 2013-10-25 NOTE — Telephone Encounter (Signed)
Patient reports that she had surgery with on call physician last night/this morning.  She said the hospital told her to follow up with you.  She questions should she keep appointment this Thursday that she already had or would you want to see her earlier than that or later than that?  Also, needs note for work. Ok?

## 2013-10-25 NOTE — Telephone Encounter (Signed)
Talk to Rosemarie Ax I gave her instructions this am. Thanks

## 2013-10-25 NOTE — Anesthesia Preprocedure Evaluation (Signed)
Anesthesia Evaluation  Patient identified by MRN, date of birth, ID band Patient awake    Reviewed: Allergy & Precautions, H&P , NPO status , Patient's Chart, lab work & pertinent test results  Airway Mallampati: II TM Distance: >3 FB Neck ROM: Full    Dental no notable dental hx.    Pulmonary neg pulmonary ROS, asthma ,  breath sounds clear to auscultation  Pulmonary exam normal       Cardiovascular negative cardio ROS  Rhythm:Regular Rate:Normal     Neuro/Psych negative neurological ROS  negative psych ROS   GI/Hepatic negative GI ROS, Neg liver ROS,   Endo/Other  negative endocrine ROS  Renal/GU negative Renal ROS  negative genitourinary   Musculoskeletal negative musculoskeletal ROS (+)   Abdominal   Peds negative pediatric ROS (+)  Hematology negative hematology ROS (+)   Anesthesia Other Findings   Reproductive/Obstetrics negative OB ROS                           Anesthesia Physical Anesthesia Plan  ASA: II  Anesthesia Plan: General   Post-op Pain Management:    Induction: Intravenous  Airway Management Planned: Oral ETT  Additional Equipment:   Intra-op Plan:   Post-operative Plan: Extubation in OR  Informed Consent: I have reviewed the patients History and Physical, chart, labs and discussed the procedure including the risks, benefits and alternatives for the proposed anesthesia with the patient or authorized representative who has indicated his/her understanding and acceptance.   Dental advisory given  Plan Discussed with: CRNA  Anesthesia Plan Comments:         Anesthesia Quick Evaluation

## 2013-10-25 NOTE — H&P (Signed)
Erika Armstrong is an 23 y.o. female  G1P0 MWF here with acute onset of lower abdominal pain this evening around 9pm.  Pt was treated on 6/11 with methotrexate after having inappropriately lowering HCGs (73, 55, 49, 46, 41, 35, 39) after pt has suspected miscarriage around 5/19.  Pt never had ultrasound showing IUP or adnexal mass.    Pt has been on Clomid for conception and has taken two dosage.  The second dose was given when the patient was early pregnant so her ultrasounds have been difficult as well due to an 8cm complex cystic ovarian lesion.  This was thought to be due to ovarian hyperstimulation from the clomid.  Last ultrasound was done in Dr. Sandrea Hughs office on 10/19/13 showing 6cm cystic lesion with 4.4cm solid echogenic foci as well.    After the Methotrexate, pt had some issues with low back pain.  This morning, she started having some bleeding and came to MAU.  Her hemoglobin was stable and exam was felt appropriate by MAU nurse so she was discharged home.  This evening, when the intense pain started, she had associated nausea and diarrhea.  She reports she is still having diarrhea and had to stop on the way to the hospital to use the bathroom.  Voiding is fine.  Denies lightheadedness or dizziness.  The pain medicine she was given today in MAU hasn't helped the pain.  She just feels like something isn't right.  Pertinent Gynecological History: Menses: irregular and heavy at times, bleeding today Contraception: none DES exposure: denies Blood transfusions: none Sexually transmitted diseases: no past history Previous GYN Procedures: nexplanon/implanon placements and removals  Last mammogram: N/A Last pap: 2013 per pt and normal per her report OB History: G1, P0   Menstrual History: Menarche age: 49  Patient's last menstrual period was 08/18/2013.    Past Medical History  Diagnosis Date  . Thyroid disease     Hypothyroid - pt states no med - saw 2nd opinion - neg   .  Endometriosis   . Ovarian cyst   . Allergy   . Asthma     no inhaler - uses nebalizer  . Seasonal allergies   . Bronchitis     history - per pt  . Headache(784.0)     otc med prn  . Arthritis     wrist, knees - otc med prn  . Anxiety     and panic attacks  . Depression     Past Surgical History  Procedure Laterality Date  . Dermoid cyst  excision      right side  . Diagnostic laparoscopy    . Laparoscopy Left 10/08/2012    Procedure: LAPAROSCOPY OPERATIVE;REMOVAL OF [place] AND  REPLACEMENT OF [place];  Surgeon: Terrance Mass, MD;  Location: [facility];  Service: Gynecology;  Laterality: Left;  left upper arm and right upper arm  . I&d extremity Right 04/06/2013    Procedure: REMOVAL OF IMPLANT RIGHT ARM--IMPLANT DISCARDED;  Surgeon: Cammie Sickle., MD;  Location: [facility];  Service: Orthopedics;  Laterality: Right;  . Nexplanon removed      Family History  Problem Relation Age of Onset  . Heart disease Mother   . Hypertension Mother   . Hyperlipidemia Father   . Diabetes Other   . Hypertension Other   . Hypertension Maternal Grandmother   . COPD Maternal Grandmother   . COPD Maternal Grandfather   . Hypertension Paternal Grandmother   . Diabetes  Paternal Grandmother   . COPD Paternal Grandfather   . Cancer Sister     Social History:  reports that she has never smoked. She has never used smokeless tobacco. She reports that she does not drink alcohol or use illicit drugs.  Allergies:  Allergies  Allergen Reactions  . Seroquel [Quetiapine Fumerate] Swelling    Mouth numbness  . Tape Rash    Prescriptions prior to admission  Medication Sig Dispense Refill  . oxyCODONE-acetaminophen (PERCOCET/ROXICET) 5-325 MG per tablet Take 2 tablets by mouth every 4 (four) hours as needed for moderate pain or severe pain.  10 tablet  0  . Prenatal Vit-Fe Fumarate-FA (PRENATAL MULTIVITAMIN) TABS tablet Take 1 tablet by mouth daily at 12 noon.       . promethazine (PHENERGAN) 25 MG tablet Take 1 tablet (25 mg total) by mouth every 6 (six) hours as needed for nausea or vomiting.  30 tablet  0  . HYDROcodone-acetaminophen (NORCO/VICODIN) 5-325 MG per tablet         Review of Systems  Constitutional: Negative.   HENT: Negative.   Eyes: Negative.   Respiratory: Negative.   Cardiovascular: Negative.   Gastrointestinal: Positive for nausea, abdominal pain and diarrhea.  Genitourinary: Negative.   Musculoskeletal: Negative.   Skin: Negative.   Neurological: Negative.   Psychiatric/Behavioral: Negative.     Blood pressure 110/61, pulse 85, temperature 98.2 F (36.8 C), temperature source Oral, resp. rate 16, last menstrual period 08/18/2013, unknown if currently breastfeeding. Physical Exam  Constitutional: She is oriented to person, place, and time. She appears well-developed and well-nourished.  HENT:  Head: Normocephalic and atraumatic.  Neck: Normal range of motion. Neck supple. No thyromegaly present.  Cardiovascular: Normal rate and regular rhythm.   Respiratory: Effort normal and breath sounds normal.  GI: She exhibits no distension and no mass. There is tenderness (all quaddrants but RLQ and LLQ are more significant). There is guarding. There is no rebound.  Hypoactive bowel sounds  Genitourinary: Uterus normal. There is no rash or tenderness on the right labia. There is no rash or tenderness on the left labia. Right adnexum displays tenderness. Left adnexum displays tenderness. There is bleeding around the vagina.  Musculoskeletal: Normal range of motion.  Lymphadenopathy:       Right: No inguinal adenopathy present.       Left: No inguinal adenopathy present.  Neurological: She is alert and oriented to person, place, and time.  Skin: Skin is warm and dry.  Psychiatric: She has a normal mood and affect.    Results for orders placed during the hospital encounter of 10/24/13 (from the past 24 hour(s))  COMPREHENSIVE  METABOLIC PANEL     Status: Abnormal   Collection Time    10/24/13 11:10 PM      Result Value Ref Range   Sodium 139  137 - 147 mEq/L   Potassium 4.0  3.7 - 5.3 mEq/L   Chloride 104  96 - 112 mEq/L   CO2 24  19 - 32 mEq/L   Glucose, Bld 128 (*) 70 - 99 mg/dL   BUN 9  6 - 23 mg/dL   Creatinine, Ser 0.68  0.50 - 1.10 mg/dL   Calcium 8.8  8.4 - 10.5 mg/dL   Total Protein 5.9 (*) 6.0 - 8.3 g/dL   Albumin 3.1 (*) 3.5 - 5.2 g/dL   AST 27  0 - 37 U/L   ALT 31  0 - 35 U/L   Alkaline Phosphatase 137 (*)  39 - 117 U/L   Total Bilirubin 0.3  0.3 - 1.2 mg/dL   GFR calc non Af Amer >90  >90 mL/min   GFR calc Af Amer >90  >90 mL/min  CBC     Status: Abnormal   Collection Time    10/25/13 12:44 AM      Result Value Ref Range   WBC 21.6 (*) 4.0 - 10.5 K/uL   RBC 4.37  3.87 - 5.11 MIL/uL   Hemoglobin 13.7  12.0 - 15.0 g/dL   HCT 40.4  36.0 - 46.0 %   MCV 92.4  78.0 - 100.0 fL   MCH 31.4  26.0 - 34.0 pg   MCHC 33.9  30.0 - 36.0 g/dL   RDW 13.5  11.5 - 15.5 %   Platelets 224  150 - 400 K/uL    US Transvaginal Non-ob  10/25/2013   CLINICAL DATA:  Patient on methotrexate therapy following ovarian stimulation. Presumed ectopic pregnancy with unsatisfactory decrease in quantitative beta HCG levels.  EXAM: TRANSABDOMINAL AND TRANSVAGINAL ULTRASOUND OF PELVIS  TECHNIQUE: Both transabdominal and transvaginal ultrasound examinations of the pelvis were performed. Transabdominal technique was performed for global imaging of the pelvis including uterus, ovaries, adnexal regions, and pelvic cul-de-sac. It was necessary to proceed with endovaginal exam following the transabdominal exam to visualize the ovaries.  COMPARISON:  10/01/2013.  FINDINGS: Uterus  Measurements: 85 mm x 37 mm x 59 mm. No fibroids or other mass visualized.  Endometrium  Thickness: 5 mm.  No focal abnormality visualized.  Right ovary  Measurements: 15 mm x 24 mm x 36 mm. 33 mm x 18 mm x 33 mm cyst with mural nodule. This may represent a  collapsed corpus luteum cyst. Attention on follow-up is recommended.  Left ovary  Measurements: 37 mm x 23 mm x 25 mm. Normal appearance/no adnexal mass.  Other findings  Moderate amount of echogenic fluid is present in the anatomic pelvis, suggestive of hemo peritoneum.  IMPRESSION: 1. No intrauterine pregnancy visualized. No adnexal mass in this patient with presumed ectopic pregnancy on methotrexate. 2. Moderate amount of echogenic fluid in the pelvis compatible with hemo peritoneum. This may relate to the presumed ectopic pregnancy or ruptured ovarian cyst. Reportedly, the patient had enlarged ovaries with numerous cysts following ovarian stimulation. 3. 33 mm right ovarian cystic lesion with mural nodule. Follow-up 6-12 week ultrasound recommended to reassess.   Electronically Signed   By: Dereck Ligas M.D.   On: 10/25/2013 00:30   US Pelvis Complete  10/25/2013   CLINICAL DATA:  Patient on methotrexate therapy following ovarian stimulation. Presumed ectopic pregnancy with unsatisfactory decrease in quantitative beta HCG levels.  EXAM: TRANSABDOMINAL AND TRANSVAGINAL ULTRASOUND OF PELVIS  TECHNIQUE: Both transabdominal and transvaginal ultrasound examinations of the pelvis were performed. Transabdominal technique was performed for global imaging of the pelvis including uterus, ovaries, adnexal regions, and pelvic cul-de-sac. It was necessary to proceed with endovaginal exam following the transabdominal exam to visualize the ovaries.  COMPARISON:  10/01/2013.  FINDINGS: Uterus  Measurements: 85 mm x 37 mm x 59 mm. No fibroids or other mass visualized.  Endometrium  Thickness: 5 mm.  No focal abnormality visualized.  Right ovary  Measurements: 15 mm x 24 mm x 36 mm. 33 mm x 18 mm x 33 mm cyst with mural nodule. This may represent a collapsed corpus luteum cyst. Attention on follow-up is recommended.  Left ovary  Measurements: 37 mm x 23 mm x 25 mm. Normal appearance/no adnexal  mass.  Other findings   Moderate amount of echogenic fluid is present in the anatomic pelvis, suggestive of hemo peritoneum.  IMPRESSION: 1. No intrauterine pregnancy visualized. No adnexal mass in this patient with presumed ectopic pregnancy on methotrexate. 2. Moderate amount of echogenic fluid in the pelvis compatible with hemo peritoneum. This may relate to the presumed ectopic pregnancy or ruptured ovarian cyst. Reportedly, the patient had enlarged ovaries with numerous cysts following ovarian stimulation. 3. 33 mm right ovarian cystic lesion with mural nodule. Follow-up 6-12 week ultrasound recommended to reassess.   Electronically Signed   By: Dereck Ligas M.D.   On: 10/25/2013 00:30    Assessment/Plan: 23 yo G1P0 MWF with hemoperitoneum due to either ruptured ectopic pregnancy or ruptured ovarian cyst.  Hemodynamically stable pt who ate at 8pm.  Will plan operative laparoscopy at 4am with possible excision of fallopian tube and possible excision of ovarian cyst.  Risks and benefits have been discussed with pt Procedure discussed with patient including but not limited to bleeding, 1% risk of receiving a  transfusion, infection, 3-4% risk of bowel/bladder/ureteral/vascular injury discussed as well as possible need for additional surgery if injury does occur discussed.  DVT/PE and rare risk of death discussed.  My actual complications with prior surgeries discussed.  Hernia formation discussed.  Positioning and incision locations discussed.  Patient aware I am not 100% sure where the blood is coming from so I cannot be sure I can save both fallopian tubes.  I will only know once intraoperative.  All questions answered.  Husband and family present.      Hale Bogus SUZANNE 10/25/2013, 1:17 AM

## 2013-10-26 ENCOUNTER — Encounter (HOSPITAL_COMMUNITY): Payer: Self-pay | Admitting: Obstetrics & Gynecology

## 2013-10-26 ENCOUNTER — Telehealth: Payer: Self-pay

## 2013-10-26 NOTE — Telephone Encounter (Signed)
No problem.

## 2013-10-26 NOTE — Telephone Encounter (Signed)
Patient advised.

## 2013-10-26 NOTE — Telephone Encounter (Signed)
Patient inquiring if okay to use "throat spray" because her throat is sore from the anesthesia tube.  She was concerned about any interaction with the Methotrexate.

## 2013-10-27 ENCOUNTER — Other Ambulatory Visit: Payer: Self-pay | Admitting: Gynecology

## 2013-10-27 ENCOUNTER — Other Ambulatory Visit: Payer: BC Managed Care – PPO

## 2013-10-27 DIAGNOSIS — O039 Complete or unspecified spontaneous abortion without complication: Secondary | ICD-10-CM

## 2013-10-28 ENCOUNTER — Telehealth: Payer: Self-pay | Admitting: *Deleted

## 2013-10-28 ENCOUNTER — Encounter: Payer: BC Managed Care – PPO | Admitting: Women's Health

## 2013-10-28 ENCOUNTER — Encounter: Payer: Self-pay | Admitting: Gynecology

## 2013-10-28 ENCOUNTER — Inpatient Hospital Stay (HOSPITAL_COMMUNITY)
Admission: AD | Admit: 2013-10-28 | Discharge: 2013-10-28 | Disposition: A | Discharge status: Home / Self Care | Payer: BC Managed Care – PPO | Source: Ambulatory Visit | Attending: Obstetrics & Gynecology | Admitting: Obstetrics & Gynecology

## 2013-10-28 ENCOUNTER — Ambulatory Visit (INDEPENDENT_AMBULATORY_CARE_PROVIDER_SITE_OTHER): Payer: BC Managed Care – PPO | Admitting: Gynecology

## 2013-10-28 VITALS — BP 126/84

## 2013-10-28 DIAGNOSIS — N9089 Other specified noninflammatory disorders of vulva and perineum: Secondary | ICD-10-CM

## 2013-10-28 DIAGNOSIS — Z8742 Personal history of other diseases of the female genital tract: Secondary | ICD-10-CM

## 2013-10-28 DIAGNOSIS — Z09 Encounter for follow-up examination after completed treatment for conditions other than malignant neoplasm: Secondary | ICD-10-CM

## 2013-10-28 DIAGNOSIS — Z8759 Personal history of other complications of pregnancy, childbirth and the puerperium: Secondary | ICD-10-CM | POA: Insufficient documentation

## 2013-10-28 DIAGNOSIS — O00109 Unspecified tubal pregnancy without intrauterine pregnancy: Secondary | ICD-10-CM | POA: Insufficient documentation

## 2013-10-28 LAB — WET PREP FOR TRICH, YEAST, CLUE
Clue Cells Wet Prep HPF POC: NONE SEEN
TRICH WET PREP: NONE SEEN
WBC, Wet Prep HPF POC: NONE SEEN
YEAST WET PREP: NONE SEEN

## 2013-10-28 LAB — HCG, QUANTITATIVE, PREGNANCY: HCG, BETA CHAIN, QUANT, S: 31.2 m[IU]/mL

## 2013-10-28 MED ORDER — METHOTREXATE INJECTION FOR WOMEN'S HOSPITAL
50.0000 mg/m2 | Freq: Once | INTRAMUSCULAR | Status: AC
  Administered 2013-10-28: 105 mg via INTRAMUSCULAR
  Filled 2013-10-28: qty 2.1

## 2013-10-28 MED ORDER — CLOBETASOL PROPIONATE 0.05 % EX CREA: 1.0000 "application " | TOPICAL_CREAM | Freq: Two times a day (BID) | CUTANEOUS | Status: DC

## 2013-10-28 MED ORDER — KETOROLAC TROMETHAMINE 10 MG PO TABS: ORAL_TABLET | ORAL | Status: DC

## 2013-10-28 NOTE — Telephone Encounter (Signed)
She can take two every four hours. Apply heating pad to abdomen. Other alternative would be to take Toradol 10 mg q 6 hour (4 in 24 hours and not to exceed 5 days). Cannot take both. This will begin to pass.

## 2013-10-28 NOTE — Progress Notes (Signed)
Patient presented to the office today for her postop visit. My colleague Dr. Sabra Heck while on call to this patient to surgery with a working diagnosis of a ectopic pregnancy, hemoperitoneum and possible ovarian cyst on 10/24/2013.  The patient's history as follows: The patient has been followed closely with what appears to be hyperstimulation after her second dose of clomiphene citrate because of her history of oligomenorrhea and infertility. Her quantitative beta hCGs have been as follows prior to her surgery:  Quantitative hCGs: 09/23/2013 - 73  09/27/2013 - 55  10/01/2013 - 49  10/06/2013 - 46  11/09/2013 - 41  10/14/2013 At hospital Quant 35  10/19/2013-50 10/21/2013-39.6  Review of record as follows:  10/01/2013 had severe lower left quadrant pain, Hospital - CT 3 left ovarian cysts largest 3.8 x 2.9 cm, rt ovarian cyst 4.1 x 3.5 largest. Ultrasound showed a positive gestational sac without a yolk sac. Left ovarian cyst 7.6 x 4.3 x 3.8 cm cyst with internal debris, right ovary not reported on ultrasound report, question if cyst noted on wrong side.  10/14/2013 office visit patient was not having any pain that day minimal intermittent cramping and denied any bleeding. Ultrasound done in the office demonstrated the following:  Anteverted uterus displaced by right ovarian mass, endometrium NO gestational sac noted. Negative color flow. Right ovary enlarged echo-free cyst 35 x 32 mm, cystic solid mass 64 x 62 x 70 mm layering of debris with negative CFD increased from 5/22. Left ovary thickwalled cystic 31 x 26 mm internal lead level echoes negative CFD. Echogenic free fluid above uterus. Patient was not having any rebound or guarding her a tenderness.   Hemoglobin 10/01/2013 13.9  Hemoglobin 10/14/2013 14.3  Followup ultrasound 10/19/2013:  Uterus measures 9.0 x 6.8 x 5.1 cm endometrial stripe 6.9 mm. No evidence of gestational sac. Avascular endometrium. Right ovary enlarged with cystic mass  thin-walled measures 6.1 x 6.2 x 6.4 mm with solid echogenic focus measuring 29 x 44 mm with positive cold flow murmur of the cyst appears to be a blood clot centrally located. The echo-free cyst measuring 32 x 25 mm negative color flow. Left ovary thinwall cyst 31 x 25 mm, 16 x 16 mm, internal low level echo. A negative color flow. Echogenic free fluid noted above the uterus.   It appeared that patient was having a miscarriage although her hCG had been dropping very slowly so she received methotrexate on June 11.  Because of the pain and discomfort the patient had Dr. Sabra Heck took her to the operating room on June 14 and performed a diagnostic laparoscopy with removal of right paratubal cyst and chromopertubation and drainage of right ovarian cyst. Left eye dural softening lysis of adhesions was undertaken.  Findings: Hemoperitoneum, mildly dilated left fallopian tube, large right ovarian cyst, small left ovarian cyst, right paratubal cyst, left adhesions of ovary to the fallopian tube  Pathology report as follows: Diagnosis 1. Paratubal cyst, right - PARATUBAL CYSTS. 2. Endometrium, curettage - DEGENERATING SECRETORY ENDOMETRIUM. - NO HYPERPLASIA OR MALIGNANCY.  Patient is doing well today some mild irritation of the vaginal. Patient anxious to go back to work. Her hospital hemoglobin and hematocrit were 13.7 and 40.4 respectively.  Yesterday's quantitative beta-hCG which would have been 37 since she received methotrexate decreased to a value of 31.2 to pretreatment value of 34. Since then only an 8.8% drop she will receive her second dose of methotrexate today.  Exam: Abdomen: Soft nontender no rebound or guarding. Port sites  intact. Pelvic exam: Genitalia external genitalia. Vagina: Some mild blood was noted in the vagina. Cervix: No active bleeding Uterus: Anteverted normal size shape and consistency Adnexa: No palpable mass or tenderness Rectal exam not done  Assessment/plan:  Patient with hyperstimulated ovaries as a result of ovulation induction agent clomiphene citrate on second round conceived decreasing quantitative beta-hCG questionable early ectopic not seen during laparoscopy. No chorionic villi identified and the D&C and recent surgery. Scissors only been an 8% drop in her quantitative beta-hCG in the past week since first dose of methotrexate was administered she will receive a second dose today. The risks benefits and pros and cons were done today no additional blood work will be needed today. We will continue to follow her quantitative beta hCGs weekly. I have asked her to refrain from intercourse until her values returned back to 0 and then she will use some form of contraception for 3 months before she attempts conception again. She was instructed not to take any prenatal vitamins or any medications with folic acid. She was also instructed to stay away from sun exposure. Also she was instructed to use Tylenol and not NSAIDs. A note to return back to work next week was provided as per her request. For her vulvar irritation she will be prescribed clobetasol 0.05% to apply externally twice a day for 5-7 days.

## 2013-10-28 NOTE — MAU Note (Signed)
Pt sent from office for MTx.

## 2013-10-28 NOTE — Telephone Encounter (Signed)
Pt was given 2nd shot of methotrexate today c/o severe cramping, took 2 pills of percocet about 30 minutes ago and no relief. Pt would like you recommendations? Please advise

## 2013-10-28 NOTE — Telephone Encounter (Signed)
Pt out of percocet will try Toradol 10 mg, rx sent. Pt informed with the below note.

## 2013-11-03 ENCOUNTER — Telehealth: Payer: Self-pay

## 2013-11-03 ENCOUNTER — Inpatient Hospital Stay (HOSPITAL_COMMUNITY)
Admission: AD | Admit: 2013-11-03 | Discharge: 2013-11-03 | Disposition: A | Discharge status: Home / Self Care | Payer: BC Managed Care – PPO | Source: Ambulatory Visit | Attending: Obstetrics and Gynecology | Admitting: Obstetrics and Gynecology

## 2013-11-03 ENCOUNTER — Encounter: Payer: Self-pay | Admitting: Gynecology

## 2013-11-03 DIAGNOSIS — O00109 Unspecified tubal pregnancy without intrauterine pregnancy: Secondary | ICD-10-CM | POA: Insufficient documentation

## 2013-11-03 DIAGNOSIS — O009 Unspecified ectopic pregnancy without intrauterine pregnancy: Secondary | ICD-10-CM

## 2013-11-03 LAB — HCG, QUANTITATIVE, PREGNANCY: HCG, BETA CHAIN, QUANT, S: 7 m[IU]/mL — AB (ref <5)

## 2013-11-03 NOTE — MAU Note (Signed)
No order in chart or with lab, call to Dr Quincy Simmonds for order. Per Dr Quincy Simmonds patient does not need to wait for results and she may leave and follow up with the office tomorrow. Pt denies any problems and will follow up with office in am.

## 2013-11-03 NOTE — Telephone Encounter (Signed)
Patient informed. I spoke with the [facility] lab and no order is there. I will fax order to them.

## 2013-11-03 NOTE — Telephone Encounter (Signed)
Patient sent the following email to Dr. Toney Rakes:  "Can I go on Wednesdays to the hospital to get my blood drawn for the hcg instead of Thursdays? Its more convenient for me to do it then since I work there in [place] on Wednesdays and I live 50 minutes away?"

## 2013-11-03 NOTE — Telephone Encounter (Signed)
Yes this would be fine. Please put in system

## 2013-11-04 ENCOUNTER — Other Ambulatory Visit: Payer: Self-pay | Admitting: Gynecology

## 2013-11-04 ENCOUNTER — Telehealth: Payer: Self-pay

## 2013-11-04 ENCOUNTER — Telehealth: Payer: Self-pay | Admitting: *Deleted

## 2013-11-04 DIAGNOSIS — O009 Unspecified ectopic pregnancy without intrauterine pregnancy: Secondary | ICD-10-CM

## 2013-11-04 NOTE — Telephone Encounter (Signed)
Pt informed with HCG results on 11/03/13.

## 2013-11-04 NOTE — Telephone Encounter (Signed)
Yes this would be easier for her as well. Quantitative Bhcg

## 2013-11-04 NOTE — Telephone Encounter (Signed)
Please inform her that the medication has ben working her Erika Armstrong is now down to &. Will need to repeat quant in one more week and it should be down to zero then.

## 2013-11-04 NOTE — Telephone Encounter (Signed)
Patient sent following email: "Another question. Can I just come to the office to get my blood drawn? I spoke to someone at the hospital and they said I was most likely going to get charged my $100 copay to come to the hospital and get it drawn whereas my insurance covers it completely if I just come into the office. I'm not too happy about that."

## 2013-11-04 NOTE — Telephone Encounter (Signed)
Order put in and emailed patient back to let her know ok. She will call for lab appt.

## 2013-11-04 NOTE — Telephone Encounter (Signed)
Pt calling requesting beta HCG results from 11/03/13. Please advise

## 2013-11-04 NOTE — MAU Provider Note (Signed)
Chief Complaint: Bloodwork  HPI: Erika Armstrong is a 23 y.o. year old G30P0010 female who presents to MAU for F/U quant after suspected ectopic pregnancy. Came to [facility] for [facility] because she could not get to [facility] during office hours. No complaints tonight.   6/11: MTX #1 for suspected ectopic pregnancy due to slowly dropping quants 6/15: Laparoscopy for HEMOPERITONEUM, BILATERAL OVARIAN CYSTS, LEFT HYDROSALPINX,RIGHT PARATUBAL CYST 6/18: MTX #2   Results for KAMBRI, DISMORE (MRN [id number]) as of 11/04/2013 05:28  Ref. Range 09/23/2013 10:41 09/27/2013 10:44 10/01/2013 08:15 10/06/2013 11:25 10/13/2013 11:04 10/14/2013 18:45 10/18/2013 11:08 10/21/2013 08:45 10/24/2013 10:45 10/27/2013 12:59 11/03/2013 20:22  hCG, Beta Chain, Quant, S Latest Range: <5 mIU/mL 73.7 55.8 49 (H) 46.56 41.1 35 (H) 50.1 39.6 34 (H) 31.2 7 (H)   Requesting to leave as soon as blood drawn. OK per Dr. Quincy Simmonds. Pt left prior to [facility] medical screening.  ASSESSMENT: Suspected ectopic pregnancy  PLAN: F/U in at [facility] tomorrow as scheduled for results  Manya Silvas, [place] 11/04/2013  2:10 AM    Addendum  I agreed to patient discharge without having result of beta hCG back as long as patient did not have pain or bleeding.  It appears she left without assessment. I will forward note to Dr. Toney Rakes, patient's primary gynecologist.

## 2013-11-04 NOTE — Discharge Instructions (Signed)
Ectopic Pregnancy °An ectopic pregnancy is when the fertilized egg attaches (implants) outside the uterus. Most ectopic pregnancies occur in the fallopian tube. Rarely do ectopic pregnancies occur on the ovary, intestine, pelvis, or cervix. In an ectopic pregnancy, the fertilized egg does not have the ability to develop into a normal, healthy baby.  °A ruptured ectopic pregnancy is one in which the fallopian tube gets torn or bursts and results in internal bleeding. Often there is intense abdominal pain, and sometimes, vaginal bleeding. Having an ectopic pregnancy can be life threatening. If left untreated, this dangerous condition can lead to a blood transfusion, abdominal surgery, or even death. °CAUSES  °Damage to the fallopian tubes is the suspected cause in most ectopic pregnancies.  °RISK FACTORS °Depending on your circumstances, the risk of having an ectopic pregnancy will vary. The level of risk can be divided into three categories. °High Risk °· You have gone through infertility treatment. °· You have had a previous ectopic pregnancy. °· You have had previous tubal surgery. °· You have had previous surgery to have the fallopian tubes tied (tubal ligation). °· You have tubal problems or diseases. °· You have been exposed to DES. DES is a medicine that was used until 1971 and had effects on babies whose mothers took the medicine. °· You become pregnant while using an intrauterine device (IUD) for birth control.  °Moderate Risk °· You have a history of infertility. °· You have a history of a sexually transmitted infection (STI). °· You have a history of pelvic inflammatory disease (PID). °· You have scarring from endometriosis. °· You have multiple sexual partners. °· You smoke.  °Low Risk °· You have had previous pelvic surgery. °· You use vaginal douching. °· You became sexually active before 23 years of age. °SIGNS AND SYMPTOMS  °An ectopic pregnancy should be suspected in anyone who has missed a period and  has abdominal pain or bleeding. °· You may experience normal pregnancy symptoms, such as: °¨ Nausea. °¨ Tiredness. °¨ Breast tenderness. °· Other symptoms may include: °¨ Pain with intercourse. °¨ Irregular vaginal bleeding or spotting. °¨ Cramping or pain on one side or in the lower abdomen. °¨ Fast heartbeat. °¨ Passing out while having a bowel movement. °· Symptoms of a ruptured ectopic pregnancy and internal bleeding may include: °¨ Sudden, severe pain in the abdomen and pelvis. °¨ Dizziness or fainting. °¨ Pain in the shoulder area. °DIAGNOSIS  °Tests that may be performed include: °· A pregnancy test. °· An ultrasound test. °· Testing the specific level of pregnancy hormone in the bloodstream. °· Taking a sample of uterus tissue (dilation and curettage, D&C). °· Surgery to perform a visual exam of the inside of the abdomen using a thin, lighted tube with a tiny camera on the end (laparoscope). °TREATMENT  °An injection of a medicine called methotrexate may be given. This medicine causes the pregnancy tissue to be absorbed. It is given if: °· The diagnosis is made early. °· The fallopian tube has not ruptured. °· You are considered to be a good candidate for the medicine. °Usually, pregnancy hormone blood levels are checked after methotrexate treatment. This is to be sure the medicine is effective. It may take 4-6 weeks for the pregnancy to be absorbed (though most pregnancies will be absorbed by 3 weeks). °Surgical treatment may be needed. A laparoscope may be used to remove the pregnancy tissue. If severe internal bleeding occurs, a cut (incision) may be made in the lower abdomen (laparotomy), and the ectopic   pregnancy is removed. This stops the bleeding. Part of the fallopian tube, or the whole tube, may be removed as well (salpingectomy). After surgery, pregnancy hormone tests may be done to be sure there is no pregnancy tissue left. You may receive an Rho(D) immune globulin shot if you are Rh negative and  the father is Rh positive, or if you do not know the Rh type of the father. This is to prevent problems with any future pregnancy. SEEK IMMEDIATE MEDICAL CARE IF:  You have any symptoms of an ectopic pregnancy. This is a medical emergency. MAKE SURE YOU:  Understand these instructions.  Will watch your condition.  Will get help right away if you are not doing well or get worse. Document Released: 06/06/2004 Document Revised: 05/04/2013 Document Reviewed: 11/26/2012 [facility] Patient Information 2015 [place], [place]. This information is not intended to replace advice given to you by your health care provider. Make sure you discuss any questions you have with your health care provider.

## 2013-11-10 ENCOUNTER — Other Ambulatory Visit: Payer: BC Managed Care – PPO

## 2013-11-10 DIAGNOSIS — O009 Unspecified ectopic pregnancy without intrauterine pregnancy: Secondary | ICD-10-CM

## 2013-11-11 ENCOUNTER — Other Ambulatory Visit: Payer: Self-pay | Admitting: Gynecology

## 2013-11-11 DIAGNOSIS — O009 Unspecified ectopic pregnancy without intrauterine pregnancy: Secondary | ICD-10-CM

## 2013-11-11 LAB — HCG, QUANTITATIVE, PREGNANCY: hCG, Beta Chain, Quant, S: 2 m[IU]/mL

## 2013-11-17 ENCOUNTER — Other Ambulatory Visit: Payer: BC Managed Care – PPO

## 2013-11-17 DIAGNOSIS — O009 Unspecified ectopic pregnancy without intrauterine pregnancy: Secondary | ICD-10-CM

## 2013-11-18 LAB — HCG, QUANTITATIVE, PREGNANCY: hCG, Beta Chain, Quant, S: <2 m[IU]/mL

## 2014-03-14 ENCOUNTER — Encounter: Payer: Self-pay | Admitting: Gynecology

## 2014-03-18 ENCOUNTER — Encounter: Payer: Self-pay | Admitting: Obstetrics & Gynecology

## 2014-04-29 ENCOUNTER — Encounter: Payer: Self-pay | Admitting: Obstetrics & Gynecology

## 2014-06-22 ENCOUNTER — Encounter (HOSPITAL_COMMUNITY): Payer: Self-pay

## 2015-02-16 IMAGING — US US TRANSVAGINAL NON-OB
1 series · 13 of 25 positions shown · non-contrast
Comparison: 10/01/2013.

CLINICAL DATA: Patient on methotrexate therapy following ovarian
stimulation. Presumed ectopic pregnancy with unsatisfactory decrease
in quantitative beta HCG levels.



[Series 1: us pelvis complete · 13 of 77 slices shown]
[im 1/77]
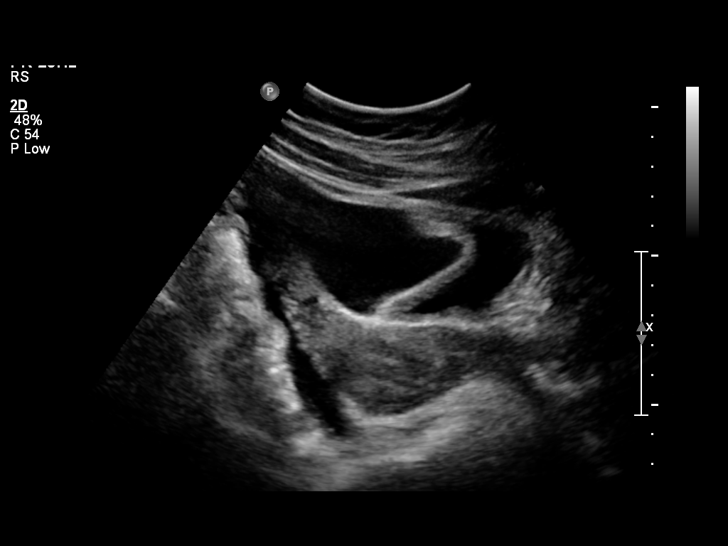
[im 7/77]
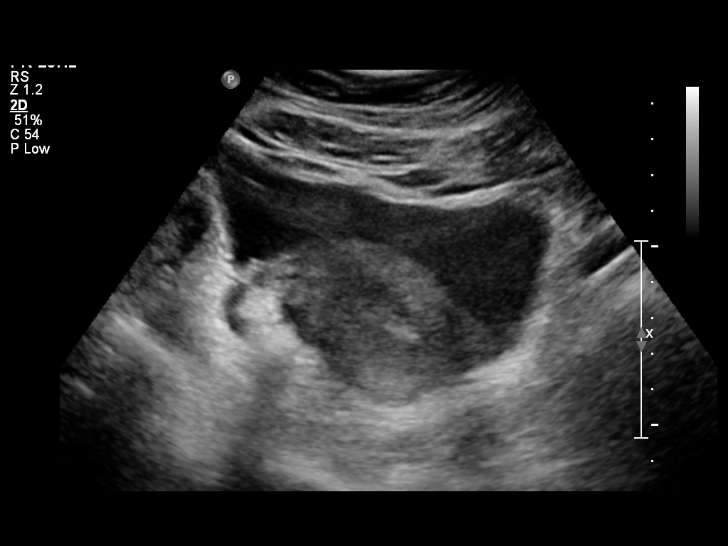
[im 13/77]
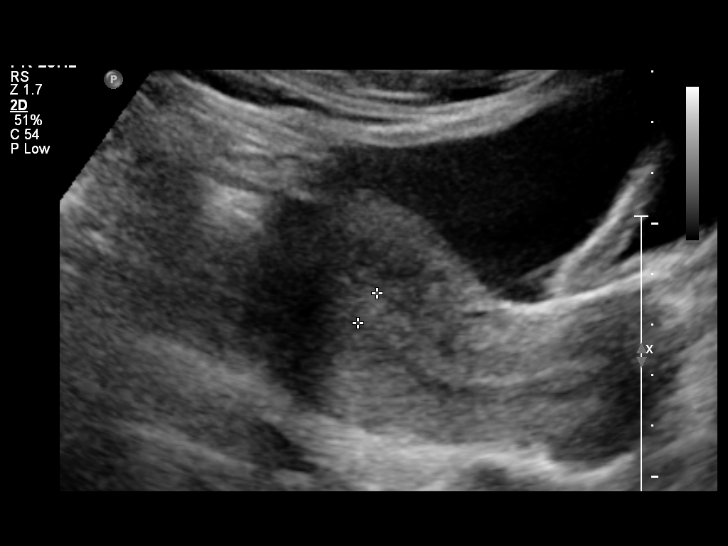
[im 20/77]
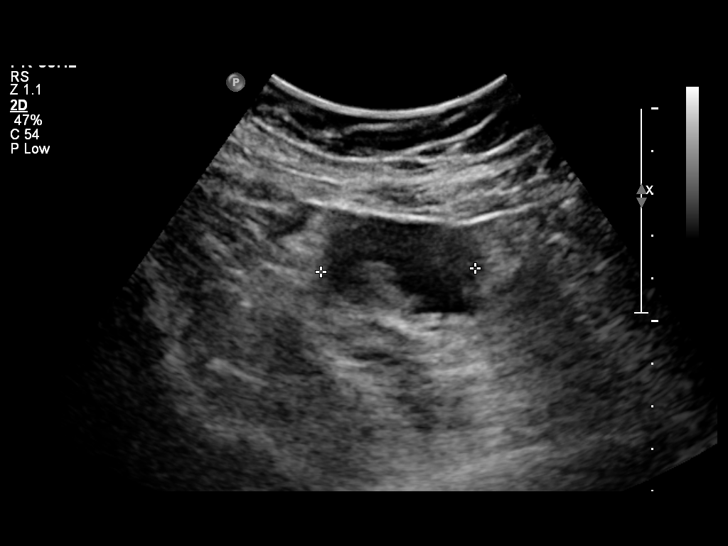
[im 26/77]
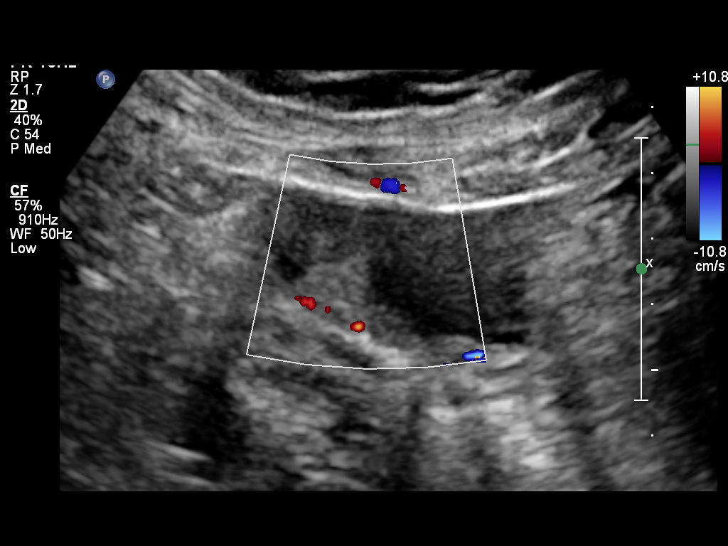
[im 32/77]
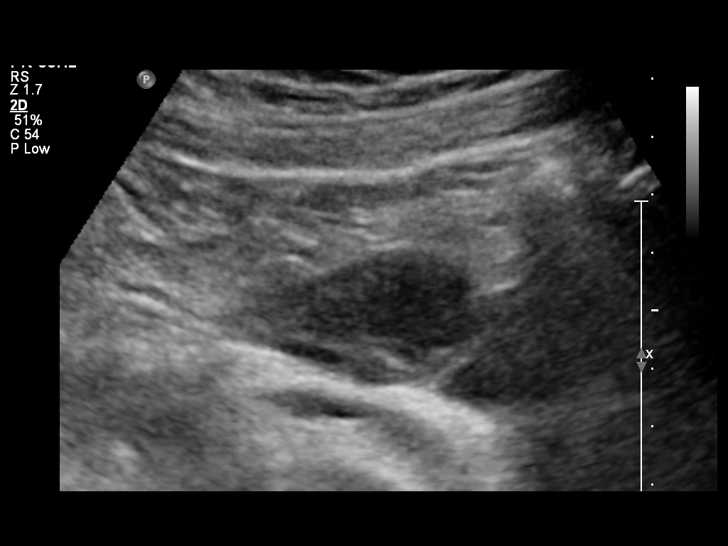
[im 39/77]
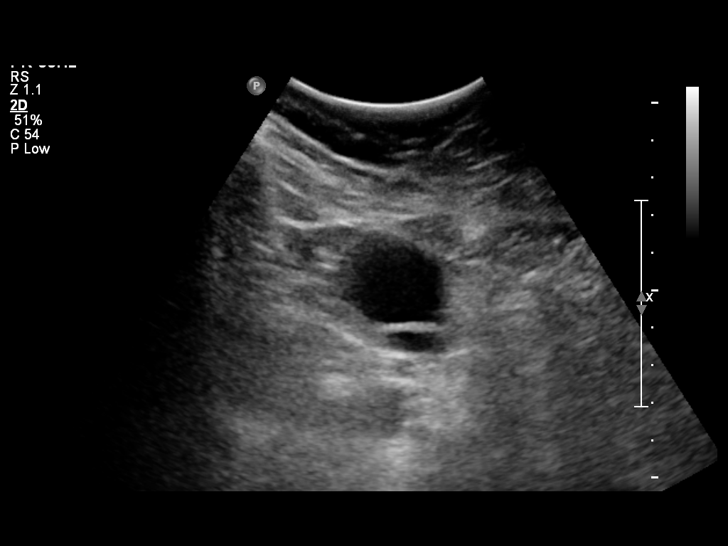
[im 45/77]
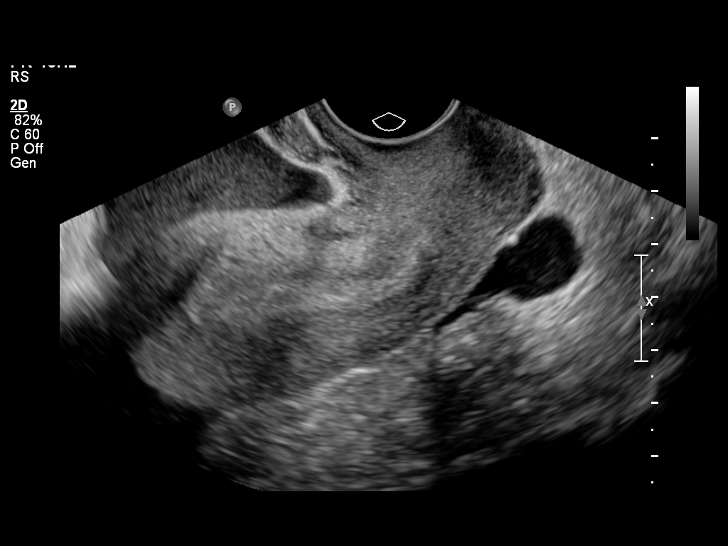
[im 51/77]
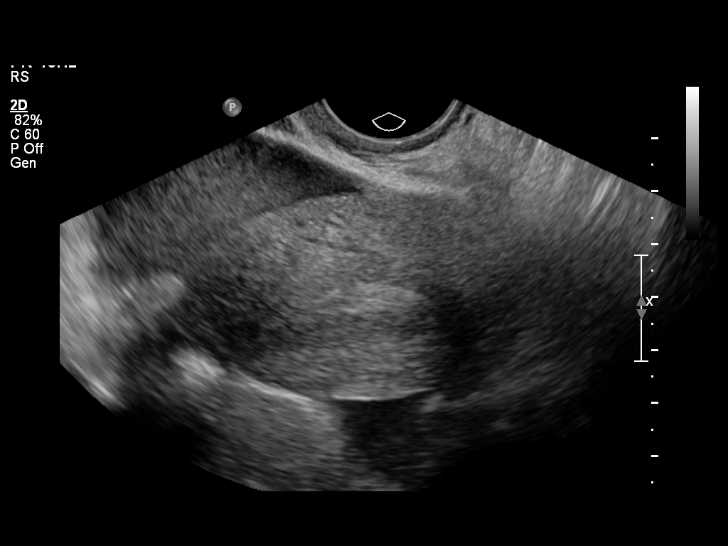
[im 58/77]
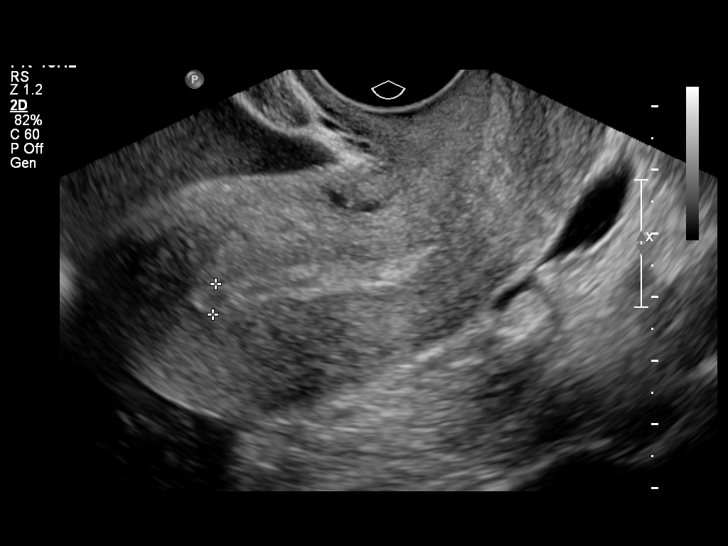
[im 64/77]
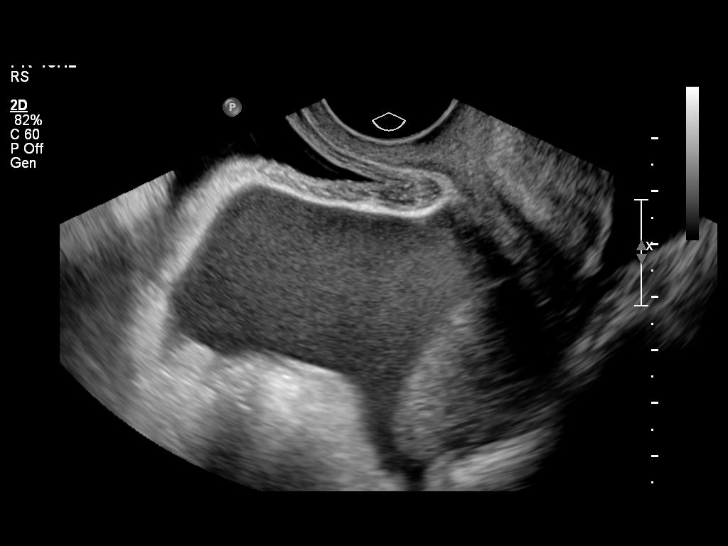
[im 70/77]
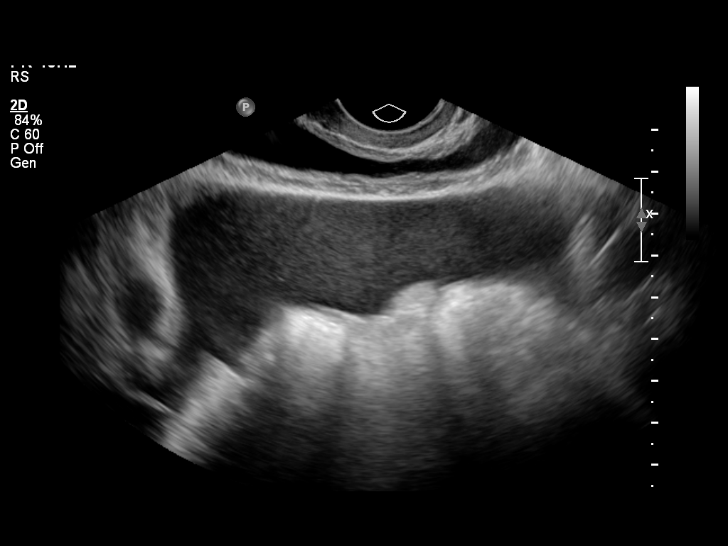
[im 77/77]
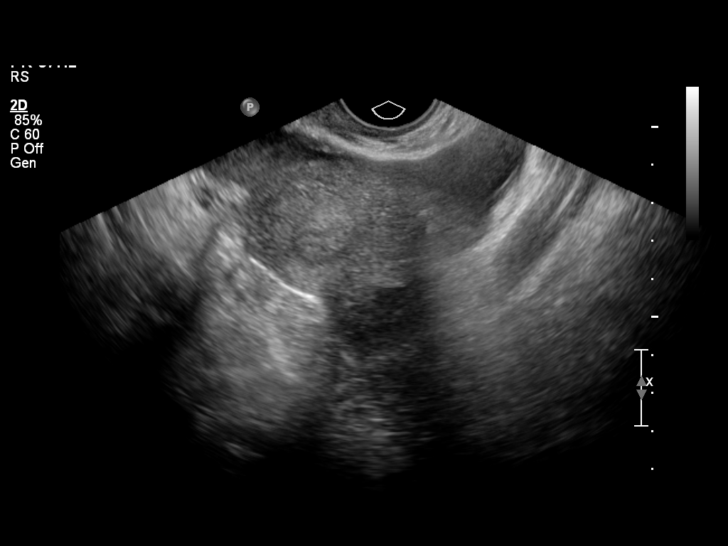

[13 of 25 positions shown; findings below may reference images not displayed]

FINDINGS: Uterus

Measurements: 85 mm x 37 mm x 59 mm.. No fibroids or other mass
visualized.

Endometrium

Thickness: 5 mm.  No focal abnormality visualized.

Right ovary

Measurements: 15 mm x 24 mm x 36 mm.. 33 mm x 18 mm x 33 mm cyst
with mural nodule. This may represent a collapsed corpus luteum
cyst. Attention on follow-up is recommended.

Left ovary

Measurements: 37 mm x 23 mm x 25 mm. Normal appearance/no adnexal
mass.

Other findings

Moderate amount of echogenic fluid is present in the anatomic
pelvis, suggestive of hemo peritoneum.
IMPRESSION: 1. No intrauterine pregnancy visualized. No adnexal mass in this
patient with presumed ectopic pregnancy on methotrexate.
2. Moderate amount of echogenic fluid in the pelvis compatible with
hemo peritoneum. This may relate to the presumed ectopic pregnancy
or ruptured ovarian cyst. Reportedly, the patient had enlarged
ovaries with numerous cysts following ovarian stimulation.
3. 33 mm right ovarian cystic lesion with mural nodule. Follow-up
6-12 week ultrasound recommended to reassess.

## 2016-07-18 ENCOUNTER — Emergency Department (HOSPITAL_COMMUNITY): Payer: PRIVATE HEALTH INSURANCE

## 2016-07-18 ENCOUNTER — Emergency Department (HOSPITAL_COMMUNITY)
Admission: EM | Admit: 2016-07-18 | Discharge: 2016-07-18 | Disposition: A | Discharge status: Home / Self Care | Payer: PRIVATE HEALTH INSURANCE | Attending: Emergency Medicine | Admitting: Emergency Medicine

## 2016-07-18 ENCOUNTER — Encounter (HOSPITAL_COMMUNITY): Payer: Self-pay

## 2016-07-18 DIAGNOSIS — R5383 Other fatigue: Secondary | ICD-10-CM | POA: Insufficient documentation

## 2016-07-18 DIAGNOSIS — R002 Palpitations: Secondary | ICD-10-CM | POA: Diagnosis not present

## 2016-07-18 DIAGNOSIS — R42 Dizziness and giddiness: Secondary | ICD-10-CM | POA: Diagnosis not present

## 2016-07-18 DIAGNOSIS — J45909 Unspecified asthma, uncomplicated: Secondary | ICD-10-CM | POA: Insufficient documentation

## 2016-07-18 DIAGNOSIS — R0789 Other chest pain: Secondary | ICD-10-CM | POA: Diagnosis not present

## 2016-07-18 LAB — CBC
HCT: 44.5 % (ref 36.0–46.0)
Hemoglobin: 15.3 g/dL — ABNORMAL HIGH (ref 12.0–15.0)
MCH: 30.7 pg (ref 26.0–34.0)
MCHC: 34.4 g/dL (ref 30.0–36.0)
MCV: 89.4 fL (ref 78.0–100.0)
Platelets: 258 10*3/uL (ref 150–400)
RBC: 4.98 MIL/uL (ref 3.87–5.11)
RDW: 13 % (ref 11.5–15.5)
WBC: 9.7 10*3/uL (ref 4.0–10.5)

## 2016-07-18 LAB — BASIC METABOLIC PANEL
Anion gap: 7 (ref 5–15)
BUN: 9 mg/dL (ref 6–20)
CHLORIDE: 102 mmol/L (ref 101–111)
CO2: 28 mmol/L (ref 22–32)
CREATININE: 0.77 mg/dL (ref 0.44–1.00)
Calcium: 9.3 mg/dL (ref 8.9–10.3)
GFR calc Af Amer: >60 mL/min (ref >60)
Glucose, Bld: 103 mg/dL — ABNORMAL HIGH (ref 65–99)
Potassium: 3.8 mmol/L (ref 3.5–5.1)
SODIUM: 137 mmol/L (ref 135–145)

## 2016-07-18 LAB — POC URINE PREG, ED: PREG TEST UR: NEGATIVE

## 2016-07-18 NOTE — Discharge Instructions (Signed)
Return to ED for worsening of symptoms including trouble breathing, severe chest pain, or syncope. See PCP in 3 days to inform of symptoms and state that ED suggested Holter monitor.

## 2016-07-18 NOTE — ED Triage Notes (Signed)
Pt complaining of chest pain, started this morning. Pt checked pulsed started to race then slow down, later began to race again, felt nauseous and wanted to pass out the pain radiates to shoulder, family hx of SVT early as early 20's

## 2016-07-18 NOTE — ED Provider Notes (Signed)
Erika Armstrong Provider Note   CSN: [id number] Arrival date & time: 07/18/16  2011     History   Chief Complaint Chief Complaint  Patient presents with  . Chest Pain    HPI Erika Armstrong is a 26 y.o. female. Patient presents with 1 day history of intermittent palpitations and chest tightness on L side that radiates to L shoulder. Rated the pain as 4/10 and is intermittent. Discomfort goes away on its own. She felt her heart racing and measured her pulse which she said was high. Felt a little lightheaded at that time but denies passing out. Experienced similar episodes a few weeks ago during bedtime but those resolved on their own. Denies prior cardiac workup.  Strong family history of SVT (mother, most maternal aunts and maternal grandmother), although she herself has never been diagnosed. Denies medication use, illicit drug use, recent trauma or injury, anxiety or stress, history of cancer, recent prolonged travel, estrogen use, smoking, recent illness or trouble breathing.  HPI  Past Medical History:  Diagnosis Date  . Allergy   . Anxiety    and panic attacks  . Arthritis    wrist, knees - otc med prn  . Asthma    no inhaler - uses nebalizer  . Bronchitis    history - per pt  . Depression   . Endometriosis   . Headache(784.0)    otc med prn  . Ovarian cyst   . Seasonal allergies   . Thyroid disease    Hypothyroid - pt states no med - saw 2nd opinion - neg     Patient Active Problem List   Diagnosis Date Noted  . History of ectopic pregnancy 10/28/2013  . Miscarriage 10/21/2013  . Asthma, chronic 04/26/2013  . Endometriosis 10/20/2012  . Ovarian cyst 02/15/2011    Past Surgical History:  Procedure Laterality Date  . DERMOID CYST  EXCISION     right side  . DIAGNOSTIC LAPAROSCOPY    . DILATION AND CURETTAGE OF UTERUS  10/25/2013   Procedure: DILATATION AND CURETTAGE;  Surgeon: Lyman Speller, MD;  Location: [facility];  Service: Gynecology;;  . I&D  EXTREMITY Right 04/06/2013   Procedure: REMOVAL OF IMPLANT RIGHT ARM--IMPLANT DISCARDED;  Surgeon: Cammie Sickle., MD;  Location: [facility];  Service: Orthopedics;  Laterality: Right;  . LAPAROSCOPY Left 10/08/2012   Procedure: LAPAROSCOPY OPERATIVE;REMOVAL OF [place] AND  REPLACEMENT OF [place];  Surgeon: Terrance Mass, MD;  Location: [facility];  Service: Gynecology;  Laterality: Left;  left upper arm and right upper arm  . LAPAROSCOPY N/A 10/25/2013   Procedure: LAPAROSCOPY DIAGNOSTIC WITH REMOVAL OF RIGHT PARATUBAL CYST, CHROMOPERTUBATION, RIGHT OVARIAN CYST DRAINAGE, left hydrosalpinx,lysis of adhesions;  Surgeon: Lyman Speller, MD;  Location: [facility];  Service: Gynecology;  Laterality: N/A;  . nexplanon removed      OB History    Gravida Para Term Preterm AB Living   1       1     SAB TAB Ectopic Multiple Live Births   1               Home Medications    Prior to Admission medications   Medication Sig Start Date End Date Taking? Authorizing Provider  albuterol (PROVENTIL HFA;VENTOLIN HFA) 108 (90 Base) MCG/ACT inhaler Inhale 1-2 puffs into the lungs every 6 (six) hours as needed for wheezing or shortness of breath.   Yes Historical Provider, MD    Family History  Family History  Problem Relation Age of Onset  . Heart disease Mother   . Hypertension Mother   . Hyperlipidemia Father   . Hypertension Maternal Grandmother   . COPD Maternal Grandmother   . COPD Maternal Grandfather   . Hypertension Paternal Grandmother   . Diabetes Paternal Grandmother   . COPD Paternal Grandfather   . Cancer Sister   . Diabetes Other   . Hypertension Other     Social History Social History  Substance Use Topics  . Smoking status: Never Smoker  . Smokeless tobacco: Never Used  . Alcohol use No     Comment: social -- 3-4 times per year      Allergies   Seroquel [quetiapine fumerate] and Tape   Review of Systems Review of Systems    Constitutional: Positive for fatigue. Negative for appetite change and fever.  HENT: Negative for rhinorrhea and sinus pressure.   Respiratory: Positive for chest tightness. Negative for cough and shortness of breath.   Cardiovascular: Positive for palpitations. Negative for leg swelling.  Gastrointestinal: Negative for abdominal distention and abdominal pain.  Musculoskeletal: Negative for neck pain.  Neurological: Positive for light-headedness. Negative for dizziness, syncope, weakness and headaches.     Physical Exam Updated Vital Signs Ht 5\' 8"  (1.727 m)   Wt 92.1 kg   LMP  (LMP Unknown)   BMI 30.87 kg/m   Physical Exam  Constitutional: She is oriented to person, place, and time. She appears well-developed and well-nourished. No distress.  HENT:  Head: Normocephalic and atraumatic.  Eyes: Pupils are equal, round, and reactive to light.  Neck: Normal range of motion.  Cardiovascular: Normal rate, regular rhythm and normal heart sounds.   Pulmonary/Chest: Effort normal and breath sounds normal.  Abdominal: Soft. Bowel sounds are normal.  Neurological: She is alert and oriented to person, place, and time.  Skin: She is not diaphoretic.  Psychiatric: She has a normal mood and affect.     ED Treatments / Results  Labs (all labs ordered are listed, but only abnormal results are displayed) Labs Reviewed  BASIC METABOLIC PANEL - Abnormal; Notable for the following:       Result Value   Glucose, Bld 103 (*)    All other components within normal limits  CBC - Abnormal; Notable for the following:    Hemoglobin 15.3 (*)    All other components within normal limits  POC URINE PREG, ED    EKG  EKG Interpretation  Date/Time:  Thursday July 18 2016 20:15:34 EST Ventricular Rate:  85 PR Interval:  144 QRS Duration: 78 QT Interval:  384 QTC Calculation: 456 R Axis:   50 Text Interpretation:  Normal sinus rhythm with sinus arrhythmia Normal ECG No old tracing to compare  Confirmed by MILLER  MD, Ko Vaya ([id number]) on 07/18/2016 9:18:25 PM       Radiology Dg Chest 2 View  Result Date: 07/18/2016 CLINICAL DATA:  Mid to left-sided chest pain EXAM: CHEST  2 VIEW COMPARISON:  08/12/2013 FINDINGS: The heart size and mediastinal contours are within normal limits. Both lungs are clear. The visualized skeletal structures are unremarkable. IMPRESSION: No active cardiopulmonary disease. Electronically Signed   By: Donavan Foil M.D.   On: 07/18/2016 22:33    Procedures Procedures (including critical care time)  Medications Ordered in ED Medications - No data to display   Initial Impression / Assessment and Plan / ED Course  I have reviewed the triage vital signs and the nursing notes.  Pertinent labs & imaging results that were available during my care of the patient were reviewed by me and considered in my medical decision making (see chart for details).     Patient's EKG did not show any abnormalities. Due to concern for anemia or electrolyte abnormalities, CBC and BMP were obtained, both of which were unremarkable. No concern for PE at this time due to exclusion criteria. She was advised to follow up with PCP for further workup in next 3 days and suggested Holter monitor.  Final Clinical Impressions(s) / ED Diagnoses   Final diagnoses:  Atypical chest pain    New Prescriptions New Prescriptions   No medications on file     [place], [place] 07/18/16 2258    Noemi Chapel, MD 07/19/16 1045

## 2016-08-10 ENCOUNTER — Encounter (HOSPITAL_COMMUNITY): Payer: Self-pay | Admitting: *Deleted

## 2016-08-10 ENCOUNTER — Ambulatory Visit (HOSPITAL_COMMUNITY)
Admission: EM | Admit: 2016-08-10 | Discharge: 2016-08-10 | Disposition: A | Discharge status: Home / Self Care | Payer: PRIVATE HEALTH INSURANCE | Attending: Internal Medicine | Admitting: Internal Medicine

## 2016-08-10 DIAGNOSIS — N61 Mastitis without abscess: Secondary | ICD-10-CM | POA: Diagnosis not present

## 2016-08-10 MED ORDER — SULFAMETHOXAZOLE-TRIMETHOPRIM 800-160 MG PO TABS: 1.0000 | ORAL_TABLET | Freq: Two times a day (BID) | ORAL | 0 refills | Status: AC

## 2016-08-10 MED ORDER — HYDROCODONE-ACETAMINOPHEN 5-325 MG PO TABS: 2.0000 | ORAL_TABLET | ORAL | 0 refills | Status: DC | PRN

## 2016-08-10 NOTE — ED Notes (Signed)
Patient discharged by provider.

## 2016-08-10 NOTE — ED Provider Notes (Signed)
CSN: [id number]     Arrival date & time 08/10/16  1531 History   None    Chief Complaint  Patient presents with  . Cough   (Consider location/radiation/quality/duration/timing/severity/associated sxs/prior Treatment) Patient c/o left breast redness and pain.  She is breastfeeding.  She has been having fever last night.   The history is provided by the patient.  Cough  Cough characteristics:  Non-productive Severity:  Mild Onset quality:  Sudden Duration:  1 day Timing:  Constant Chronicity:  New Smoker: no   Relieved by:  None tried Worsened by:  Nothing Ineffective treatments:  None tried Associated symptoms: rash     Past Medical History:  Diagnosis Date  . Allergy   . Anxiety    and panic attacks  . Arthritis    wrist, knees - otc med prn  . Asthma    no inhaler - uses nebalizer  . Bronchitis    history - per pt  . Depression   . Endometriosis   . Headache(784.0)    otc med prn  . Ovarian cyst   . Seasonal allergies   . Thyroid disease    Hypothyroid - pt states no med - saw 2nd opinion - neg    Past Surgical History:  Procedure Laterality Date  . DERMOID CYST  EXCISION     right side  . DIAGNOSTIC LAPAROSCOPY    . DILATION AND CURETTAGE OF UTERUS  10/25/2013   Procedure: DILATATION AND CURETTAGE;  Surgeon: Lyman Speller, MD;  Location: [facility];  Service: Gynecology;;  . I&D EXTREMITY Right 04/06/2013   Procedure: REMOVAL OF IMPLANT RIGHT ARM--IMPLANT DISCARDED;  Surgeon: Cammie Sickle., MD;  Location: [facility];  Service: Orthopedics;  Laterality: Right;  . LAPAROSCOPY Left 10/08/2012   Procedure: LAPAROSCOPY OPERATIVE;REMOVAL OF [place] AND  REPLACEMENT OF [place];  Surgeon: Terrance Mass, MD;  Location: [facility];  Service: Gynecology;  Laterality: Left;  left upper arm and right upper arm  . LAPAROSCOPY N/A 10/25/2013   Procedure: LAPAROSCOPY DIAGNOSTIC WITH REMOVAL OF RIGHT PARATUBAL CYST, CHROMOPERTUBATION, RIGHT  OVARIAN CYST DRAINAGE, left hydrosalpinx,lysis of adhesions;  Surgeon: Lyman Speller, MD;  Location: [facility];  Service: Gynecology;  Laterality: N/A;  . nexplanon removed     Family History  Problem Relation Age of Onset  . Heart disease Mother   . Hypertension Mother   . Hyperlipidemia Father   . Hypertension Maternal Grandmother   . COPD Maternal Grandmother   . COPD Maternal Grandfather   . Hypertension Paternal Grandmother   . Diabetes Paternal Grandmother   . COPD Paternal Grandfather   . Cancer Sister   . Diabetes Other   . Hypertension Other    Social History  Substance Use Topics  . Smoking status: Never Smoker  . Smokeless tobacco: Never Used  . Alcohol use No     Comment: social -- 3-4 times per year    OB History    Gravida Para Term Preterm AB Living   1       1     SAB TAB Ectopic Multiple Live Births   1             Review of Systems  Constitutional: Negative.   HENT: Negative.   Eyes: Negative.   Respiratory: Positive for cough.   Cardiovascular: Negative.   Gastrointestinal: Negative.   Endocrine: Negative.   Genitourinary: Negative.   Musculoskeletal: Negative.   Skin: Positive for rash.  Allergic/Immunologic:  Negative.   Neurological: Negative.   Hematological: Negative.   Psychiatric/Behavioral: Negative.     Allergies  Seroquel [quetiapine fumerate] and Tape  Home Medications   Prior to Admission medications   Medication Sig Start Date End Date Taking? Authorizing Provider  albuterol (PROVENTIL HFA;VENTOLIN HFA) 108 (90 Base) MCG/ACT inhaler Inhale 1-2 puffs into the lungs every 6 (six) hours as needed for wheezing or shortness of breath.    Historical Provider, MD  HYDROcodone-acetaminophen (NORCO/VICODIN) 5-325 MG tablet Take 2 tablets by mouth every 4 (four) hours as needed. 08/10/16   Lysbeth Penner, FNP  sulfamethoxazole-trimethoprim (BACTRIM DS,SEPTRA DS) 800-160 MG tablet Take 1 tablet by mouth 2 (two) times daily. 08/10/16  08/17/16  Lysbeth Penner, FNP   Meds Ordered and Administered this Visit  Medications - No data to display  BP 117/81 (BP Location: Left Arm)   Pulse 81   Temp 98 F (36.7 C) (Oral)   Resp 14   LMP  (LMP Unknown)   SpO2 98%   Breastfeeding? Yes  No data found.   Physical Exam  Constitutional: She appears well-developed and well-nourished.  HENT:  Head: Normocephalic and atraumatic.  Eyes: Conjunctivae and EOM are normal. Pupils are equal, round, and reactive to light.  Cardiovascular: Normal rate, regular rhythm and normal heart sounds.   Pulmonary/Chest: Effort normal and breath sounds normal.  Skin: Rash noted. There is erythema.  Left upper breast with erythema and rash and TTP.  No mass appreciated.  Nursing note and vitals reviewed.   Urgent Care Course     Procedures (including critical care time)  Labs Review Labs Reviewed - No data to display  Imaging Review No results found.   Visual Acuity Review  Right Eye Distance:   Left Eye Distance:   Bilateral Distance:    Right Eye Near:   Left Eye Near:    Bilateral Near:         MDM   1. Acute mastitis of left breast    Bactrim DS one po bid x 10 days #20 Norco 5/325 one po q 6 hours prn #6      Lysbeth Penner, FNP 08/10/16 1746

## 2016-08-10 NOTE — ED Triage Notes (Signed)
Pt  Reports      Fever  Chills          Last  Pm     earlier  In  Day   She  Repotted   Some  Tenderness       And  Swelling  Of  l  Breast  Pt  Is  Breast  Feeding         She  Is  Awake  And  Alert and  Oriented

## 2016-09-25 ENCOUNTER — Encounter: Payer: Self-pay | Admitting: Gynecology

## 2017-01-15 ENCOUNTER — Encounter (HOSPITAL_COMMUNITY): Payer: Self-pay

## 2017-01-15 ENCOUNTER — Inpatient Hospital Stay (HOSPITAL_COMMUNITY)
Admission: AD | Admit: 2017-01-15 | Discharge: 2017-01-16 | Disposition: A | Discharge status: Home / Self Care | Payer: PRIVATE HEALTH INSURANCE | Source: Ambulatory Visit | Attending: Obstetrics and Gynecology | Admitting: Obstetrics and Gynecology

## 2017-01-15 DIAGNOSIS — Z3A16 16 weeks gestation of pregnancy: Secondary | ICD-10-CM | POA: Insufficient documentation

## 2017-01-15 DIAGNOSIS — R1030 Lower abdominal pain, unspecified: Secondary | ICD-10-CM | POA: Insufficient documentation

## 2017-01-15 DIAGNOSIS — O26892 Other specified pregnancy related conditions, second trimester: Secondary | ICD-10-CM | POA: Diagnosis not present

## 2017-01-15 DIAGNOSIS — R109 Unspecified abdominal pain: Secondary | ICD-10-CM | POA: Diagnosis not present

## 2017-01-15 DIAGNOSIS — O26899 Other specified pregnancy related conditions, unspecified trimester: Secondary | ICD-10-CM | POA: Diagnosis not present

## 2017-01-15 LAB — LIPASE, BLOOD: LIPASE: 23 U/L (ref 11–51)

## 2017-01-15 LAB — URINALYSIS, ROUTINE W REFLEX MICROSCOPIC
Bacteria, UA: NONE SEEN
Bilirubin Urine: NEGATIVE
Glucose, UA: NEGATIVE mg/dL
Hgb urine dipstick: NEGATIVE
KETONES UR: NEGATIVE mg/dL
Nitrite: NEGATIVE
Protein, ur: NEGATIVE mg/dL
Specific Gravity, Urine: 1.011 (ref 1.005–1.030)
pH: 5 (ref 5.0–8.0)

## 2017-01-15 LAB — COMPREHENSIVE METABOLIC PANEL
ALT: 13 U/L — AB (ref 14–54)
AST: 15 U/L (ref 15–41)
Albumin: 3.1 g/dL — ABNORMAL LOW (ref 3.5–5.0)
Alkaline Phosphatase: 93 U/L (ref 38–126)
Anion gap: 9 (ref 5–15)
BILIRUBIN TOTAL: 0.4 mg/dL (ref 0.3–1.2)
BUN: 6 mg/dL (ref 6–20)
CHLORIDE: 106 mmol/L (ref 101–111)
CO2: 21 mmol/L — ABNORMAL LOW (ref 22–32)
Calcium: 8.6 mg/dL — ABNORMAL LOW (ref 8.9–10.3)
Creatinine, Ser: 0.5 mg/dL (ref 0.44–1.00)
Glucose, Bld: 93 mg/dL (ref 65–99)
POTASSIUM: 3.3 mmol/L — AB (ref 3.5–5.1)
Sodium: 136 mmol/L (ref 135–145)
TOTAL PROTEIN: 6.3 g/dL — AB (ref 6.5–8.1)

## 2017-01-15 LAB — CBC WITH DIFFERENTIAL/PLATELET
BASOS ABS: 0 10*3/uL (ref 0.0–0.1)
Basophils Relative: 0 %
EOS PCT: 2 %
Eosinophils Absolute: 0.3 10*3/uL (ref 0.0–0.7)
HEMATOCRIT: 35.4 % — AB (ref 36.0–46.0)
Hemoglobin: 12.5 g/dL (ref 12.0–15.0)
LYMPHS ABS: 3.3 10*3/uL (ref 0.7–4.0)
LYMPHS PCT: 28 %
MCH: 31.6 pg (ref 26.0–34.0)
MCHC: 35.3 g/dL (ref 30.0–36.0)
MCV: 89.4 fL (ref 78.0–100.0)
MONO ABS: 0.5 10*3/uL (ref 0.1–1.0)
Monocytes Relative: 4 %
NEUTROS ABS: 7.9 10*3/uL — AB (ref 1.7–7.7)
Neutrophils Relative %: 66 %
PLATELETS: 225 10*3/uL (ref 150–400)
RBC: 3.96 MIL/uL (ref 3.87–5.11)
RDW: 14.2 % (ref 11.5–15.5)
WBC: 12 10*3/uL — ABNORMAL HIGH (ref 4.0–10.5)

## 2017-01-15 NOTE — MAU Note (Signed)
Pt here with c/o abdominal pain and pressure. Denies any bleeding or leaking of fluid.

## 2017-01-15 NOTE — MAU Provider Note (Signed)
Chief Complaint:  Abdominal Pain   First Provider Initiated Contact with Patient 01/15/17 2202     HPI: Erika Armstrong is a 26 y.o. [id number] at 103w5dwho presents to maternity admissions reporting lower abdominal pain and pressure.  Sees OB in [place].  States their nurse "tried to tell me it was ligament pain but it hurts even when Im not moving".  So came here. No bleeding.. She denies LOF, vaginal bleeding, vaginal itching/burning, urinary symptoms, h/a, dizziness, n/v, diarrhea, constipation or fever/chills.   Abdominal Pain  This is a recurrent problem. The current episode started in the past 7 days. The onset quality is gradual. The problem occurs intermittently. The problem has been unchanged. The pain is located in the suprapubic region, LLQ and RLQ. The pain is mild. The quality of the pain is cramping. The abdominal pain does not radiate. Pertinent negatives include no constipation, diarrhea, dysuria, fever, headaches, myalgias, nausea or vomiting. Nothing aggravates the pain. The pain is relieved by nothing. She has tried nothing for the symptoms.    RN note: Pt here with c/o abdominal pain and pressure. Denies any bleeding or leaking of fluid.   Past Medical History: Past Medical History:  Diagnosis Date  . Allergy   . Anxiety    and panic attacks  . Arthritis    wrist, knees - otc med prn  . Asthma    no inhaler - uses nebalizer  . Bronchitis    history - per pt  . Depression   . Endometriosis   . Headache(784.0)    otc med prn  . Ovarian cyst   . Seasonal allergies   . Thyroid disease    Hypothyroid - pt states no med - saw 2nd opinion - neg     Past obstetric history: OB History  Gravida Para Term Preterm AB Living  4 1 1   2 1   SAB TAB Ectopic Multiple Live Births  1   1        # Outcome Date GA Lbr Len/2nd Weight Sex Delivery Anes PTL Lv  4 Current           3 SAB 2015             Birth Comments: System Generated. Please review and update pregnancy  details.  2 Term           1 Ectopic               Past Surgical History: Past Surgical History:  Procedure Laterality Date  . DERMOID CYST  EXCISION     right side  . DIAGNOSTIC LAPAROSCOPY    . DILATION AND CURETTAGE OF UTERUS  10/25/2013   Procedure: DILATATION AND CURETTAGE;  Surgeon: Lyman Speller, MD;  Location: [facility];  Service: Gynecology;;  . I&D EXTREMITY Right 04/06/2013   Procedure: REMOVAL OF IMPLANT RIGHT ARM--IMPLANT DISCARDED;  Surgeon: Cammie Sickle., MD;  Location: [facility];  Service: Orthopedics;  Laterality: Right;  . LAPAROSCOPY Left 10/08/2012   Procedure: LAPAROSCOPY OPERATIVE;REMOVAL OF [place] AND  REPLACEMENT OF [place];  Surgeon: Terrance Mass, MD;  Location: [facility];  Service: Gynecology;  Laterality: Left;  left upper arm and right upper arm  . LAPAROSCOPY N/A 10/25/2013   Procedure: LAPAROSCOPY DIAGNOSTIC WITH REMOVAL OF RIGHT PARATUBAL CYST, CHROMOPERTUBATION, RIGHT OVARIAN CYST DRAINAGE, left hydrosalpinx,lysis of adhesions;  Surgeon: Lyman Speller, MD;  Location: [facility];  Service: Gynecology;  Laterality: N/A;  . nexplanon  removed      Family History: Family History  Problem Relation Age of Onset  . Heart disease Mother   . Hypertension Mother   . Hyperlipidemia Father   . Hypertension Maternal Grandmother   . COPD Maternal Grandmother   . COPD Maternal Grandfather   . Hypertension Paternal Grandmother   . Diabetes Paternal Grandmother   . COPD Paternal Grandfather   . Cancer Sister   . Diabetes Other   . Hypertension Other     Social History: Social History  Substance Use Topics  . Smoking status: Never Smoker  . Smokeless tobacco: Never Used  . Alcohol use No     Comment: social -- 3-4 times per year     Allergies:  Allergies  Allergen Reactions  . Seroquel [Quetiapine Fumerate] Swelling    Mouth numbness  . Tape Rash    Meds:  No prescriptions prior to admission.    I have  reviewed patient's Past Medical Hx, Surgical Hx, Family Hx, Social Hx, medications and allergies.   ROS:  Review of Systems  Constitutional: Negative for fever.  Gastrointestinal: Positive for abdominal pain. Negative for constipation, diarrhea, nausea and vomiting.  Genitourinary: Negative for dysuria.  Musculoskeletal: Negative for myalgias.  Neurological: Negative for headaches.   Other systems negative  Physical Exam  Patient Vitals for the past 24 hrs:  BP Temp Temp src Pulse Resp SpO2 Height Weight  01/15/17 2356 115/71 - - 72 - - - -  01/15/17 2142 128/75 98.1 F (36.7 C) Oral 73 18 100 % 5\' 8"  (1.727 m) 211 lb (95.7 kg)   Constitutional: Well-developed, well-nourished female in no acute distress.  Cardiovascular: normal rate and rhythm Respiratory: normal effort, clear to auscultation bilaterally GI: Abd soft, mildly tender in lower abdomen, gravid appropriate for gestational age.   No rebound or guarding. MS: Extremities nontender, no edema, normal ROM Neurologic: Alert and oriented x 4.  GU: Neg CVAT.  PELVIC EXAM: cervix firm, posterior, neg CMT, uterus nontender, Fundal Height consistent with dates, adnexa without tenderness, enlargement, or mass  Dilation: Closed Effacement (%): Thick Exam by:: Hansel Feinstein, CNM  FHT:  150 by bedside US            Fetus active   Labs: Results for orders placed or performed during the hospital encounter of 01/15/17 (from the past 24 hour(s))  Urinalysis, Routine w reflex microscopic     Status: Abnormal   Collection Time: 01/15/17  9:45 PM  Result Value Ref Range   Color, Urine YELLOW YELLOW   APPearance HAZY (A) CLEAR   Specific Gravity, Urine 1.011 1.005 - 1.030   pH 5.0 5.0 - 8.0   Glucose, UA NEGATIVE NEGATIVE mg/dL   Hgb urine dipstick NEGATIVE NEGATIVE   Bilirubin Urine NEGATIVE NEGATIVE   Ketones, ur NEGATIVE NEGATIVE mg/dL   Protein, ur NEGATIVE NEGATIVE mg/dL   Nitrite NEGATIVE NEGATIVE   Leukocytes, UA TRACE  (A) NEGATIVE   RBC / HPF 0-5 0 - 5 RBC/hpf   WBC, UA 0-5 0 - 5 WBC/hpf   Bacteria, UA NONE SEEN NONE SEEN   Squamous Epithelial / LPF 6-30 (A) NONE SEEN   Mucus PRESENT   CBC with Differential/Platelet     Status: Abnormal   Collection Time: 01/15/17 10:30 PM  Result Value Ref Range   WBC 12.0 (H) 4.0 - 10.5 K/uL   RBC 3.96 3.87 - 5.11 MIL/uL   Hemoglobin 12.5 12.0 - 15.0 g/dL   HCT 35.4 (L)  36.0 - 46.0 %   MCV 89.4 78.0 - 100.0 fL   MCH 31.6 26.0 - 34.0 pg   MCHC 35.3 30.0 - 36.0 g/dL   RDW 14.2 11.5 - 15.5 %   Platelets 225 150 - 400 K/uL   Neutrophils Relative % 66 %   Neutro Abs 7.9 (H) 1.7 - 7.7 K/uL   Lymphocytes Relative 28 %   Lymphs Abs 3.3 0.7 - 4.0 K/uL   Monocytes Relative 4 %   Monocytes Absolute 0.5 0.1 - 1.0 K/uL   Eosinophils Relative 2 %   Eosinophils Absolute 0.3 0.0 - 0.7 K/uL   Basophils Relative 0 %   Basophils Absolute 0.0 0.0 - 0.1 K/uL  Comprehensive metabolic panel     Status: Abnormal   Collection Time: 01/15/17 10:30 PM  Result Value Ref Range   Sodium 136 135 - 145 mmol/L   Potassium 3.3 (L) 3.5 - 5.1 mmol/L   Chloride 106 101 - 111 mmol/L   CO2 21 (L) 22 - 32 mmol/L   Glucose, Bld 93 65 - 99 mg/dL   BUN 6 6 - 20 mg/dL   Creatinine, Ser 0.50 0.44 - 1.00 mg/dL   Calcium 8.6 (L) 8.9 - 10.3 mg/dL   Total Protein 6.3 (L) 6.5 - 8.1 g/dL   Albumin 3.1 (L) 3.5 - 5.0 g/dL   AST 15 15 - 41 U/L   ALT 13 (L) 14 - 54 U/L   Alkaline Phosphatase 93 38 - 126 U/L   Total Bilirubin 0.4 0.3 - 1.2 mg/dL   GFR calc non Af Amer >60 >60 mL/min   GFR calc Af Amer >60 >60 mL/min   Anion gap 9 5 - 15  Lipase, blood     Status: None   Collection Time: 01/15/17 10:30 PM  Result Value Ref Range   Lipase 23 11 - 51 U/L      Imaging:  Limited bedside US done for FHR confirmation Fetus active FHR 150 Grossly normal fetus  MAU Course/MDM: I have ordered labs and reviewed results.  No evidence of acute abdominal process is evident.  No rebound or guarding.   Suspect this may be uterine cramping or Round ligament pain.  No evidence of preterm labor Patient appears very comfortable Urine is clear No significant leukocytosis No elevation in LFTs or lipase    Assessment: 1. Abdominal cramping affecting pregnancy     Plan: Discharge home Preterm Labor precautions and fetal kick counts Follow up in Office for prenatal visits and recheck of status Encouraged to return here or to other Urgent Care/ED if she develops worsening of symptoms, increase in pain, fever, or other concerning symptoms.     Follow-up Information    Your doctor Follow up.   Contact information: [place], [place]          Pt stable at time of discharge.  Hansel Feinstein CNM, MSN Certified Nurse-Midwife 01/16/2017 2:14 AM

## 2017-01-15 NOTE — Discharge Instructions (Signed)

## 2017-01-16 DIAGNOSIS — O26899 Other specified pregnancy related conditions, unspecified trimester: Secondary | ICD-10-CM | POA: Diagnosis not present

## 2017-01-16 DIAGNOSIS — Z3A16 16 weeks gestation of pregnancy: Secondary | ICD-10-CM

## 2017-01-16 DIAGNOSIS — R109 Unspecified abdominal pain: Secondary | ICD-10-CM

## 2017-01-16 DIAGNOSIS — O26892 Other specified pregnancy related conditions, second trimester: Secondary | ICD-10-CM | POA: Diagnosis not present

## 2017-01-16 NOTE — Progress Notes (Signed)
Pt discharged home with significant other via private vehicle. Discharge instructions given with no questions. Pt able to ambulate to car. No acute distress noted.

## 2017-06-15 ENCOUNTER — Other Ambulatory Visit: Payer: Self-pay

## 2017-06-15 ENCOUNTER — Inpatient Hospital Stay (EMERGENCY_DEPARTMENT_HOSPITAL)
Admission: AD | Admit: 2017-06-15 | Discharge: 2017-06-16 | Disposition: A | Discharge status: Home / Self Care | Payer: Medicaid Other | Source: Ambulatory Visit | Attending: Obstetrics & Gynecology | Admitting: Obstetrics & Gynecology

## 2017-06-15 ENCOUNTER — Encounter (HOSPITAL_COMMUNITY): Payer: Self-pay | Admitting: *Deleted

## 2017-06-15 ENCOUNTER — Emergency Department (HOSPITAL_COMMUNITY)
Admission: EM | Admit: 2017-06-15 | Discharge: 2017-06-15 | Disposition: A | Discharge status: Home / Self Care | Payer: Medicaid Other | Attending: Emergency Medicine | Admitting: Emergency Medicine

## 2017-06-15 ENCOUNTER — Encounter (HOSPITAL_COMMUNITY): Payer: Self-pay | Admitting: Emergency Medicine

## 2017-06-15 ENCOUNTER — Emergency Department (HOSPITAL_COMMUNITY): Payer: Medicaid Other

## 2017-06-15 ENCOUNTER — Inpatient Hospital Stay (HOSPITAL_COMMUNITY): Payer: Medicaid Other

## 2017-06-15 DIAGNOSIS — R51 Headache: Secondary | ICD-10-CM

## 2017-06-15 DIAGNOSIS — J45909 Unspecified asthma, uncomplicated: Secondary | ICD-10-CM | POA: Diagnosis not present

## 2017-06-15 DIAGNOSIS — O163 Unspecified maternal hypertension, third trimester: Secondary | ICD-10-CM

## 2017-06-15 DIAGNOSIS — O99513 Diseases of the respiratory system complicating pregnancy, third trimester: Secondary | ICD-10-CM | POA: Diagnosis not present

## 2017-06-15 DIAGNOSIS — J209 Acute bronchitis, unspecified: Secondary | ICD-10-CM | POA: Diagnosis not present

## 2017-06-15 DIAGNOSIS — E039 Hypothyroidism, unspecified: Secondary | ICD-10-CM | POA: Diagnosis not present

## 2017-06-15 DIAGNOSIS — J0100 Acute maxillary sinusitis, unspecified: Secondary | ICD-10-CM | POA: Diagnosis not present

## 2017-06-15 DIAGNOSIS — Z79899 Other long term (current) drug therapy: Secondary | ICD-10-CM | POA: Diagnosis not present

## 2017-06-15 DIAGNOSIS — Z3A38 38 weeks gestation of pregnancy: Secondary | ICD-10-CM | POA: Diagnosis not present

## 2017-06-15 DIAGNOSIS — R05 Cough: Secondary | ICD-10-CM | POA: Insufficient documentation

## 2017-06-15 DIAGNOSIS — O99283 Endocrine, nutritional and metabolic diseases complicating pregnancy, third trimester: Secondary | ICD-10-CM | POA: Diagnosis not present

## 2017-06-15 DIAGNOSIS — R519 Headache, unspecified: Secondary | ICD-10-CM

## 2017-06-15 DIAGNOSIS — O368130 Decreased fetal movements, third trimester, not applicable or unspecified: Secondary | ICD-10-CM

## 2017-06-15 LAB — URINALYSIS, ROUTINE W REFLEX MICROSCOPIC
Bilirubin Urine: NEGATIVE
HGB URINE DIPSTICK: NEGATIVE
KETONES UR: NEGATIVE mg/dL
NITRITE: NEGATIVE
PH: 6 (ref 5.0–8.0)
Protein, ur: NEGATIVE mg/dL
Specific Gravity, Urine: 1.007 (ref 1.005–1.030)

## 2017-06-15 LAB — BASIC METABOLIC PANEL
Anion gap: 10 (ref 5–15)
BUN: 5 mg/dL — AB (ref 6–20)
CHLORIDE: 108 mmol/L (ref 101–111)
CO2: 20 mmol/L — ABNORMAL LOW (ref 22–32)
Calcium: 8.4 mg/dL — ABNORMAL LOW (ref 8.9–10.3)
Creatinine, Ser: 0.51 mg/dL (ref 0.44–1.00)
Glucose, Bld: 112 mg/dL — ABNORMAL HIGH (ref 65–99)
POTASSIUM: 3.5 mmol/L (ref 3.5–5.1)
SODIUM: 138 mmol/L (ref 135–145)

## 2017-06-15 LAB — COMPREHENSIVE METABOLIC PANEL
ALK PHOS: 185 U/L — AB (ref 38–126)
ALT: 13 U/L — ABNORMAL LOW (ref 14–54)
ANION GAP: 8 (ref 5–15)
AST: 23 U/L (ref 15–41)
Albumin: 2.6 g/dL — ABNORMAL LOW (ref 3.5–5.0)
BILIRUBIN TOTAL: 0.6 mg/dL (ref 0.3–1.2)
BUN: 5 mg/dL — ABNORMAL LOW (ref 6–20)
CALCIUM: 8.5 mg/dL — AB (ref 8.9–10.3)
CO2: 18 mmol/L — ABNORMAL LOW (ref 22–32)
Chloride: 109 mmol/L (ref 101–111)
Creatinine, Ser: 0.52 mg/dL (ref 0.44–1.00)
GFR calc non Af Amer: >60 mL/min (ref >60)
Glucose, Bld: 139 mg/dL — ABNORMAL HIGH (ref 65–99)
POTASSIUM: 3.6 mmol/L (ref 3.5–5.1)
Sodium: 135 mmol/L (ref 135–145)
TOTAL PROTEIN: 5.9 g/dL — AB (ref 6.5–8.1)

## 2017-06-15 LAB — CBC
HEMATOCRIT: 36.6 % (ref 36.0–46.0)
HEMOGLOBIN: 12.5 g/dL (ref 12.0–15.0)
MCH: 32.1 pg (ref 26.0–34.0)
MCHC: 34.2 g/dL (ref 30.0–36.0)
MCV: 94.1 fL (ref 78.0–100.0)
Platelets: 177 10*3/uL (ref 150–400)
RBC: 3.89 MIL/uL (ref 3.87–5.11)
RDW: 14.8 % (ref 11.5–15.5)
WBC: 10.9 10*3/uL — ABNORMAL HIGH (ref 4.0–10.5)

## 2017-06-15 LAB — PROTEIN / CREATININE RATIO, URINE: CREATININE, URINE: 36 mg/dL

## 2017-06-15 MED ORDER — SODIUM CHLORIDE 0.9 % IV BOLUS (SEPSIS)
1000.0000 mL | Freq: Once | INTRAVENOUS | Status: AC
  Administered 2017-06-15: 1000 mL via INTRAVENOUS

## 2017-06-15 MED ORDER — ALBUTEROL SULFATE (2.5 MG/3ML) 0.083% IN NEBU
5.0000 mg | INHALATION_SOLUTION | Freq: Once | RESPIRATORY_TRACT | Status: AC
  Administered 2017-06-15: 5 mg via RESPIRATORY_TRACT
  Filled 2017-06-15: qty 6

## 2017-06-15 MED ORDER — BENZONATATE 100 MG PO CAPS: 100.0000 mg | ORAL_CAPSULE | Freq: Three times a day (TID) | ORAL | 0 refills | Status: DC

## 2017-06-15 MED ORDER — LACTATED RINGERS IV BOLUS (SEPSIS)
1000.0000 mL | Freq: Once | INTRAVENOUS | Status: AC
  Administered 2017-06-15: 1000 mL via INTRAVENOUS

## 2017-06-15 MED ORDER — BUTALBITAL-APAP-CAFFEINE 50-325-40 MG PO TABS
2.0000 | ORAL_TABLET | Freq: Once | ORAL | Status: AC
  Administered 2017-06-15: 2 via ORAL
  Filled 2017-06-15: qty 2

## 2017-06-15 MED ORDER — PREDNISONE 10 MG PO TABS: 20.0000 mg | ORAL_TABLET | Freq: Every day | ORAL | 0 refills | Status: AC

## 2017-06-15 MED ORDER — AMOXICILLIN-POT CLAVULANATE 875-125 MG PO TABS: 1.0000 | ORAL_TABLET | Freq: Two times a day (BID) | ORAL | 0 refills | Status: AC

## 2017-06-15 MED ORDER — IOPAMIDOL (ISOVUE-370) INJECTION 76%
100.0000 mL | Freq: Once | INTRAVENOUS | Status: AC | PRN
  Administered 2017-06-15: 100 mL via INTRAVENOUS

## 2017-06-15 NOTE — ED Notes (Signed)
POs provided,    To CT via stretcher

## 2017-06-15 NOTE — Discharge Instructions (Signed)
Take antibiotic as prescribed.  Take the entire course of antibiotics, even if your symptoms improve. Take prednisone as prescribed. Use Tylenol or ibuprofen as needed for fevers or body aches. Use Tessalon Perles as needed for cough. Use your inhaler every 2-4 hours for the next 2 days.  After this, continue to use it only as needed. Make sure you stay well-hydrated with water. Wash your hands frequently to prevent spread of infection. Follow-up with your OB/GYN at your scheduled appointment tomorrow.  Return to the emergency room if you develop persistent chest pain, difficulty breathing, or any new or worsening symptoms.

## 2017-06-15 NOTE — ED Notes (Signed)
Cough for several days   Expectorant  Has used humidifier without relief

## 2017-06-15 NOTE — ED Triage Notes (Addendum)
PT c/o nasal congestion, sinus pressure, sore throat, and productive cough/wheezing x4 days. PT is 38 weeks 1 day pregnant and c/o SOB worsening x4 days. PT states no relief from ProAir inhaler used this am at 0600.

## 2017-06-15 NOTE — ED Notes (Signed)
PA called pt results are returned and pt ready for dispo

## 2017-06-15 NOTE — MAU Provider Note (Signed)
History     CSN: [id number]  Arrival date and time: 06/15/17 2053   None     Chief Complaint  Patient presents with  . Hypertension   27 yo [id number] at [redacted]w[redacted]d here for decreased fetal movement x 1 hour and elevated blood pressure of 158/85 at home. Receives prenatal [facility]. She tells me she has "borderline" gestational hypertension. Chart review shows single systolic blood pressure of 139. Patient says since arriving to MAU, she has felt baby move a couple times. She admits to a headache that is mild and comes and goes. Patient is sick with viral URI and was seen in ED today. CTA chest was wnl.     OB History    Gravida Para Term Preterm AB Living   4 1 1   2 1    SAB TAB Ectopic Multiple Live Births   1   1          Past Medical History:  Diagnosis Date  . Allergy   . Anxiety    and panic attacks  . Arthritis    wrist, knees - otc med prn  . Asthma    no inhaler - uses nebalizer  . Bronchitis    history - per pt  . Depression   . Endometriosis   . Headache(784.0)    otc med prn  . Ovarian cyst   . Seasonal allergies   . Thyroid disease    Hypothyroid - pt states no med - saw 2nd opinion - neg     Past Surgical History:  Procedure Laterality Date  . DERMOID CYST  EXCISION     right side  . DIAGNOSTIC LAPAROSCOPY    . DILATION AND CURETTAGE OF UTERUS  10/25/2013   Procedure: DILATATION AND CURETTAGE;  Surgeon: Lyman Speller, MD;  Location: [facility];  Service: Gynecology;;  . I&D EXTREMITY Right 04/06/2013   Procedure: REMOVAL OF IMPLANT RIGHT ARM--IMPLANT DISCARDED;  Surgeon: Cammie Sickle., MD;  Location: [facility];  Service: Orthopedics;  Laterality: Right;  . LAPAROSCOPY Left 10/08/2012   Procedure: LAPAROSCOPY OPERATIVE;REMOVAL OF [place] AND  REPLACEMENT OF [place];  Surgeon: Terrance Mass, MD;  Location: [facility];  Service: Gynecology;  Laterality: Left;  left upper arm and right upper arm  .  LAPAROSCOPY N/A 10/25/2013   Procedure: LAPAROSCOPY DIAGNOSTIC WITH REMOVAL OF RIGHT PARATUBAL CYST, CHROMOPERTUBATION, RIGHT OVARIAN CYST DRAINAGE, left hydrosalpinx,lysis of adhesions;  Surgeon: Lyman Speller, MD;  Location: [facility];  Service: Gynecology;  Laterality: N/A;  . nexplanon removed      Family History  Problem Relation Age of Onset  . Heart disease Mother   . Hypertension Mother   . Hyperlipidemia Father   . Hypertension Maternal Grandmother   . COPD Maternal Grandmother   . COPD Maternal Grandfather   . Hypertension Paternal Grandmother   . Diabetes Paternal Grandmother   . COPD Paternal Grandfather   . Cancer Sister   . Diabetes Other   . Hypertension Other     Social History   Tobacco Use  . Smoking status: Never Smoker  . Smokeless tobacco: Never Used  Substance Use Topics  . Alcohol use: No    Comment: social -- 3-4 times per year   . Drug use: No    Allergies:  Allergies  Allergen Reactions  . Seroquel [Quetiapine Fumerate] Swelling    Mouth numbness  . Tape Rash    Medications Prior to Admission  Medication Sig Dispense Refill Last Dose  . albuterol (PROVENTIL HFA;VENTOLIN HFA) 108 (90 Base) MCG/ACT inhaler Inhale 1-2 puffs into the lungs every 6 (six) hours as needed for wheezing or shortness of breath.   More than a month at Unknown time  . amoxicillin-clavulanate (AUGMENTIN) 875-125 MG tablet Take 1 tablet by mouth every 12 (twelve) hours for 10 days. 20 tablet 0   . benzonatate (TESSALON) 100 MG capsule Take 1 capsule (100 mg total) by mouth every 8 (eight) hours. 21 capsule 0   . omeprazole (PRILOSEC) 20 MG capsule Take 20 mg by mouth daily.   Past Week at Unknown time  . predniSONE (DELTASONE) 10 MG tablet Take 2 tablets (20 mg total) by mouth daily for 5 days. 10 tablet 0   . Prenatal Vit-Fe Fumarate-FA (MULTIVITAMIN-PRENATAL) 27-0.8 MG TABS tablet Take 1 tablet by mouth daily at 12 noon.   01/14/2017 at Unknown time  . promethazine  (PHENERGAN) 25 MG suppository Place 25 mg rectally every 6 (six) hours as needed for nausea or vomiting.   Past Week at Unknown time    Review of Systems  Constitutional: Negative for activity change and appetite change.  HENT: Positive for congestion and rhinorrhea.   Eyes: Negative for discharge and itching.  Respiratory: Negative for apnea and chest tightness.   Cardiovascular: Negative for chest pain and leg swelling.  Gastrointestinal: Negative for abdominal distention and abdominal pain.  Endocrine: Negative for cold intolerance and heat intolerance.  Genitourinary: Negative for difficulty urinating and dysuria.  Musculoskeletal: Negative for arthralgias and back pain.  Neurological: Positive for headaches. Negative for dizziness.   Physical Exam   Blood pressure 124/73, pulse (!) 118, temperature 98.3 F (36.8 C), temperature source Oral, resp. rate 17, height 5\' 8"  (1.727 m), weight 238 lb 1.9 oz (108 kg), SpO2 98 %, currently breastfeeding.  Physical Exam  Constitutional: She is oriented to person, place, and time. She appears well-developed and well-nourished.  HENT:  Head: Normocephalic and atraumatic.  Eyes: Conjunctivae are normal. Pupils are equal, round, and reactive to light.  Neck: Normal range of motion. Neck supple.  Cardiovascular: Intact distal pulses.  Tachycardia to 118  Respiratory: Effort normal. No respiratory distress.  GI: Soft. She exhibits no distension.  Musculoskeletal: Normal range of motion. She exhibits no edema.  Neurological: She is alert and oriented to person, place, and time.  Skin: Skin is warm and dry.  Psychiatric: She has a normal mood and affect. Her behavior is normal.    MAU Course  Procedures  EFM 130 bpm/mod var/pos acels/no decels- cat 1  MDM Will get BPP due to decreased fetal movement, although EFM shows cat 1. PIH labs pending. Fioricet for headache.  Assessment and Plan  1. Pregnancy at completed [redacted] weeks gestation-  follow up outpatient. 2. Decreased fetal movement- BPP 8/8. Follow up outpatient 3. Elevated blood pressure during pregnancy- normal blood pressure in MAU. Labs wnl. 4. Headache- improved with fioricet  Thayer Inabinet 06/15/2017, 10:22 PM

## 2017-06-15 NOTE — ED Provider Notes (Signed)
[facility] EMERGENCY DEPARTMENT Provider Note   CSN: [id number] Arrival date & time: 06/15/17  1031     History   Chief Complaint Chief Complaint  Patient presents with  . Cough    HPI Erika Armstrong is a 27 y.o. female presenting for evaluation of SOB.   Pt states that she has had URI sxs for the past 5 days.  Symptoms include nasal congestion, cough, shortness of breath, and wheezing.  Additionally, patient is [redacted] weeks pregnant.  She is not considered high risk.  Patient reports that she has had worsening shortness of breath over the past several days.  This morning, she was awakened from sleep around 6:00 with shortness of breath.  This was mildly relieved with albuterol inhaler, but she had continued shortness of breath.  She reports chest pain with coughing.  She denies other medical problems.  She denies fevers, chills, ear pain, eye pain, sore throat, nausea, vomiting, abdominal pain, abdominal cramping, urinary symptoms, vaginal bleeding, or abnormal bowel movements.  She denies history of DVTs, recent travel, history of cancer, or OCP use.   HPI  Past Medical History:  Diagnosis Date  . Allergy   . Anxiety    and panic attacks  . Arthritis    wrist, knees - otc med prn  . Asthma    no inhaler - uses nebalizer  . Bronchitis    history - per pt  . Depression   . Endometriosis   . Headache(784.0)    otc med prn  . Ovarian cyst   . Seasonal allergies   . Thyroid disease    Hypothyroid - pt states no med - saw 2nd opinion - neg     Patient Active Problem List   Diagnosis Date Noted  . History of ectopic pregnancy 10/28/2013  . Miscarriage 10/21/2013  . Asthma, chronic 04/26/2013  . Endometriosis 10/20/2012  . Ovarian cyst 02/15/2011    Past Surgical History:  Procedure Laterality Date  . DERMOID CYST  EXCISION     right side  . DIAGNOSTIC LAPAROSCOPY    . DILATION AND CURETTAGE OF UTERUS  10/25/2013   Procedure: DILATATION AND CURETTAGE;  Surgeon: Lyman Speller, MD;  Location: [facility];  Service: Gynecology;;  . I&D EXTREMITY Right 04/06/2013   Procedure: REMOVAL OF IMPLANT RIGHT ARM--IMPLANT DISCARDED;  Surgeon: Cammie Sickle., MD;  Location: [place];  Service: Orthopedics;  Laterality: Right;  . LAPAROSCOPY Left 10/08/2012   Procedure: LAPAROSCOPY OPERATIVE;REMOVAL OF [place] AND  REPLACEMENT OF [place];  Surgeon: Terrance Mass, MD;  Location: [facility];  Service: Gynecology;  Laterality: Left;  left upper arm and right upper arm  . LAPAROSCOPY N/A 10/25/2013   Procedure: LAPAROSCOPY DIAGNOSTIC WITH REMOVAL OF RIGHT PARATUBAL CYST, CHROMOPERTUBATION, RIGHT OVARIAN CYST DRAINAGE, left hydrosalpinx,lysis of adhesions;  Surgeon: Lyman Speller, MD;  Location: [facility] [facility];  Service: Gynecology;  Laterality: N/A;  . nexplanon removed      OB History    Gravida Para Term Preterm AB Living   4 1 1   2 1    SAB TAB Ectopic Multiple Live Births   1   1           Home Medications    Prior to Admission medications   Medication Sig Start Date End Date Taking? Authorizing Provider  albuterol (PROVENTIL HFA;VENTOLIN HFA) 108 (90 Base) MCG/ACT inhaler Inhale 1-2 puffs into the lungs every 6 (six) hours as needed for wheezing  or shortness of breath.    [provider]  amoxicillin-clavulanate (AUGMENTIN) 875-125 MG tablet Take 1 tablet by mouth every 12 (twelve) hours for 10 days. 06/15/17 06/25/17  Sanda Dejoy, PA-C  benzonatate (TESSALON) 100 MG capsule Take 1 capsule (100 mg total) by mouth every 8 (eight) hours. 06/15/17   Ryleigh Esqueda, PA-C  omeprazole (PRILOSEC) 20 MG capsule Take 20 mg by mouth daily.    [provider]  predniSONE (DELTASONE) 10 MG tablet Take 2 tablets (20 mg total) by mouth daily for 5 days. 06/15/17 06/20/17  Tor Tsuda, PA-C  Prenatal Vit-Fe Fumarate-FA (MULTIVITAMIN-PRENATAL) 27-0.8 MG TABS tablet Take 1 tablet by mouth daily at 12 noon.    [provider]  promethazine (PHENERGAN) 25 MG suppository Place 25 mg rectally every 6 (six) hours as needed for nausea or vomiting.    [provider]    Family History Family History  Problem Relation Age of Onset  . Heart disease Mother   . Hypertension Mother   . Hyperlipidemia Father   . Hypertension Maternal Grandmother   . COPD Maternal Grandmother   . COPD Maternal Grandfather   . Hypertension Paternal Grandmother   . Diabetes Paternal Grandmother   . COPD Paternal Grandfather   . Cancer Sister   . Diabetes Other   . Hypertension Other     Social History Social History   Tobacco Use  . Smoking status: Never Smoker  . Smokeless tobacco: Never Used  Substance Use Topics  . Alcohol use: No    Comment: social -- 3-4 times per year   . Drug use: No     Allergies   Seroquel [quetiapine fumerate] and Tape   Review of Systems Review of Systems  HENT: Positive for congestion.   Respiratory: Positive for cough, shortness of breath and wheezing.   All other systems reviewed and are negative.    Physical Exam Updated Vital Signs BP 126/75 (BP Location: Left Arm)   Pulse (!) 118   Temp 98.6 F (37 C) (Oral)   Resp 16   Ht 5\' 8"  (1.727 m)   Wt 105.7 kg (233 lb)   LMP  (LMP Unknown)   SpO2 99%   BMI 35.43 kg/m   Physical Exam  Constitutional: She is oriented to person, place, and time. She appears well-developed and well-nourished. No distress.  HENT:  Head: Normocephalic and atraumatic.  Right Ear: Tympanic membrane, external ear and ear canal normal.  Left Ear: Tympanic membrane, external ear and ear canal normal.  Nose: Mucosal edema present. Right sinus exhibits no maxillary sinus tenderness and no frontal sinus tenderness. Left sinus exhibits maxillary sinus tenderness. Left sinus exhibits no frontal sinus tenderness.  Mouth/Throat: Uvula is midline, oropharynx is clear and moist and mucous membranes are normal. No tonsillar exudate.  Eyes:  Conjunctivae and EOM are normal. Pupils are equal, round, and reactive to light.  Neck: Normal range of motion. Neck supple.  Cardiovascular: Regular rhythm and intact distal pulses.  tachycardic  Pulmonary/Chest: Effort normal. No respiratory distress. She has wheezes (end expiratory wheezes with forced exhalation). She exhibits no tenderness.  Abdominal: Soft. She exhibits no distension and no mass. There is no tenderness. There is no guarding.  Musculoskeletal: Normal range of motion.  No leg pain or swelling  Lymphadenopathy:    She has no cervical adenopathy.  Neurological: She is alert and oriented to person, place, and time.  Skin: Skin is warm and dry.  Psychiatric: She has a  normal mood and affect.  Nursing note and vitals reviewed.    ED Treatments / Results  Labs (all labs ordered are listed, but only abnormal results are displayed) Labs Reviewed  BASIC METABOLIC PANEL - Abnormal; Notable for the following components:      Result Value   CO2 20 (*)    Glucose, Bld 112 (*)    BUN 5 (*)    Calcium 8.4 (*)    All other components within normal limits    EKG  EKG Interpretation None       Radiology Ct Angio Chest Pe W And/or Wo Contrast  Result Date: 06/15/2017 CLINICAL DATA:  Cough, shortness of breath x 3-4 days. History of asthma and bronchitis. [redacted] weeks pregnant. EXAM: CT ANGIOGRAPHY CHEST WITH CONTRAST TECHNIQUE: Multidetector CT imaging of the chest was performed using the standard protocol during bolus administration of intravenous contrast. Multiplanar CT image reconstructions and MIPs were obtained to evaluate the vascular anatomy. CONTRAST:  171mL ISOVUE-370 IOPAMIDOL (ISOVUE-370) INJECTION 76% COMPARISON:  Chest radiographs dated 07/18/2016 FINDINGS: Cardiovascular: Satisfactory opacification of the bilateral pulmonary arteries to the segmental level. However, evaluation is mildly constrained due to respiratory motion. Within that constraint, there is no  evidence of pulmonary embolism. No evidence of thoracic aortic aneurysm or dissection. The heart is normal in size.  No pericardial effusion. Mediastinum/Nodes: No suspicious mediastinal lymphadenopathy. Visualized thyroid is unremarkable. Lungs/Pleura: Lungs are clear. No focal consolidation. No suspicious pulmonary nodules. No pleural effusion or pneumothorax. Upper Abdomen: Visualized upper abdomen is unremarkable. Musculoskeletal: Visualized osseous structures are within normal limits. Review of the MIP images confirms the above findings. IMPRESSION: No evidence pulmonary embolism. Normal CT chest. Electronically Signed   By: Julian Hy M.D.   On: 06/15/2017 14:36    Procedures Procedures (including critical care time)  Medications Ordered in ED Medications  sodium chloride 0.9 % bolus 1,000 mL (0 mLs Intravenous Stopped 06/15/17 1326)  albuterol (PROVENTIL) (2.5 MG/3ML) 0.083% nebulizer solution 5 mg (5 mg Nebulization Given 06/15/17 1245)  iopamidol (ISOVUE-370) 76 % injection 100 mL (100 mLs Intravenous Contrast Given 06/15/17 1414)     Initial Impression / Assessment and Plan / ED Course  I have reviewed the triage vital signs and the nursing notes.  Pertinent labs & imaging results that were available during my care of the patient were reviewed by me and considered in my medical decision making (see chart for details).     Patient presenting for evaluation of shortness of breath, cough, congestion, and chest pain.  Physical exam shows patient is tachycardic and afebrile.  She appears nontoxic.  Pulmonary exam shows patient with end expiratory wheezing with forced exhalation.  No obvious shortness of breath or respiratory distress.  No accessory muscle use.  She is not hypoxic.  Concern for URI versus PE.  Case discussed with attending, Dr. Sabra Heck evaluated the patient.  Recommend CTA to rule out PE.  Will give another breathing treatment for shortness of breath  CTA negative.  Heart  rate improved slightly with fluid.  Discussed findings with patient.  Discussed she likely has bronchitis, exacerbated by asthma.  Patient with tenderness of the sinus and worsening symptoms over the past several days, concern for sinusitis.  Will prescribe antibiotics, Tessalon Perles, low-dose steroids.  Patient has an appointment with her OB/GYN tomorrow.  At this time, patient appears safe for discharge.  Return precautions given.  Patient states she understands and agrees to plan.   Final Clinical Impressions(s) / ED  Diagnoses   Final diagnoses:  Acute maxillary sinusitis, recurrence not specified  Acute bronchitis, unspecified organism    ED Discharge Orders        Ordered    benzonatate (TESSALON) 100 MG capsule  Every 8 hours     06/15/17 1510    predniSONE (DELTASONE) 10 MG tablet  Daily     06/15/17 1510    amoxicillin-clavulanate (AUGMENTIN) 875-125 MG tablet  Every 12 hours     06/15/17 [place], Kippy Gohman, PA-C 06/15/17 1541    Noemi Chapel, MD 06/15/17 [phone]

## 2017-06-15 NOTE — Discharge Instructions (Signed)

## 2017-06-15 NOTE — ED Provider Notes (Signed)
The patient is seen by [facility] in [facility], she is approximately [redacted] weeks pregnant, has been exposed to other people at home of been sick with coughing wheezing and feeling poorly, she has also had shortness of breath with cough.  On exam the patient does have some wheezing especially on forced expiration, she is tachycardic to 120 with normal pulses and no edema.  She has had an unremarkable pregnancy thus far  IV fluids will be given, the patient has not had bronchodilator therapy in over 6 hours thus the tachycardia is far greater concern.  We will need to rule out pulmonary embolism  Pt aware of risks of radiation - agreeable to this procedure  Medical screening examination/treatment/procedure(s) were conducted as a shared visit with non-physician practitioner(s) and myself.  I personally evaluated the patient during the encounter.  Clinical Impression:   Final diagnoses:  Acute maxillary sinusitis, recurrence not specified  Acute bronchitis, unspecified organism         Noemi Chapel, MD 06/15/17 [phone]

## 2017-06-15 NOTE — MAU Note (Signed)
Pt states she hasn't felt the baby move in the last hour.  States her blood pressure was elevated at home and her pulse was elevated.  Was diagnosed with bronchitis and a sinus infection today at [facility].  Denies LOF/VB.

## 2017-09-02 ENCOUNTER — Encounter (HOSPITAL_COMMUNITY): Payer: Self-pay | Admitting: Emergency Medicine

## 2017-09-02 ENCOUNTER — Other Ambulatory Visit: Payer: Self-pay

## 2017-09-02 ENCOUNTER — Emergency Department (HOSPITAL_COMMUNITY)
Admission: EM | Admit: 2017-09-02 | Discharge: 2017-09-02 | Disposition: A | Discharge status: Home / Self Care | Payer: Medicaid Other | Attending: Emergency Medicine | Admitting: Emergency Medicine

## 2017-09-02 DIAGNOSIS — J45909 Unspecified asthma, uncomplicated: Secondary | ICD-10-CM | POA: Insufficient documentation

## 2017-09-02 DIAGNOSIS — K122 Cellulitis and abscess of mouth: Secondary | ICD-10-CM | POA: Insufficient documentation

## 2017-09-02 DIAGNOSIS — R070 Pain in throat: Secondary | ICD-10-CM | POA: Diagnosis present

## 2017-09-02 DIAGNOSIS — J020 Streptococcal pharyngitis: Secondary | ICD-10-CM | POA: Insufficient documentation

## 2017-09-02 DIAGNOSIS — Z79899 Other long term (current) drug therapy: Secondary | ICD-10-CM | POA: Insufficient documentation

## 2017-09-02 LAB — GROUP A STREP BY PCR: GROUP A STREP BY PCR: DETECTED — AB

## 2017-09-02 MED ORDER — IBUPROFEN 800 MG PO TABS
800.0000 mg | ORAL_TABLET | Freq: Once | ORAL | Status: AC
  Administered 2017-09-02: 800 mg via ORAL
  Filled 2017-09-02: qty 1

## 2017-09-02 MED ORDER — AMOXICILLIN 250 MG PO CAPS
500.0000 mg | ORAL_CAPSULE | Freq: Once | ORAL | Status: AC
  Administered 2017-09-02: 500 mg via ORAL
  Filled 2017-09-02: qty 2

## 2017-09-02 MED ORDER — MAGIC MOUTHWASH W/LIDOCAINE: 10.0000 mL | Freq: Four times a day (QID) | ORAL | 0 refills | Status: DC | PRN

## 2017-09-02 MED ORDER — IBUPROFEN 800 MG PO TABS
800.0000 mg | ORAL_TABLET | Freq: Three times a day (TID) | ORAL | 0 refills | Status: DC
Start: 2017-09-02 — End: 2020-02-02

## 2017-09-02 MED ORDER — AMOXICILLIN 500 MG PO CAPS: 500.0000 mg | ORAL_CAPSULE | Freq: Three times a day (TID) | ORAL | 0 refills | Status: AC

## 2017-09-02 NOTE — ED Provider Notes (Signed)
[facility] EMERGENCY DEPARTMENT Provider Note   CSN: [id number] Arrival date & time: 09/02/17  4098     History   Chief Complaint Chief Complaint  Patient presents with  . Sore Throat    HPI Erika Armstrong is a 27 y.o. female.  The history is provided by the patient.  Sore Throat  This is a new problem. The current episode started 12 to 24 hours ago. The problem occurs constantly. The problem has been gradually worsening. Pertinent negatives include no chest pain, no abdominal pain, no headaches and no shortness of breath. Associated symptoms comments: Nasal congestion with clear rhinorrhea and low grade fever. . The symptoms are aggravated by swallowing. The symptoms are relieved by NSAIDs. Treatments tried: ibuprofen. The treatment provided mild relief.    Past Medical History:  Diagnosis Date  . Allergy   . Anxiety    and panic attacks  . Arthritis    wrist, knees - otc med prn  . Asthma    no inhaler - uses nebalizer  . Bronchitis    history - per pt  . Depression   . Endometriosis   . Headache(784.0)    otc med prn  . Ovarian cyst   . Seasonal allergies   . Thyroid disease    Hypothyroid - pt states no med - saw 2nd opinion - neg     Patient Active Problem List   Diagnosis Date Noted  . History of ectopic pregnancy 10/28/2013  . Miscarriage 10/21/2013  . Asthma, chronic 04/26/2013  . Endometriosis 10/20/2012  . Ovarian cyst 02/15/2011    Past Surgical History:  Procedure Laterality Date  . DERMOID CYST  EXCISION     right side  . DIAGNOSTIC LAPAROSCOPY    . DILATION AND CURETTAGE OF UTERUS  10/25/2013   Procedure: DILATATION AND CURETTAGE;  Surgeon: Lyman Speller, MD;  Location: [facility];  Service: Gynecology;;  . I&D EXTREMITY Right 04/06/2013   Procedure: REMOVAL OF IMPLANT RIGHT ARM--IMPLANT DISCARDED;  Surgeon: Cammie Sickle., MD;  Location: [place];  Service: Orthopedics;  Laterality: Right;  . LAPAROSCOPY Left  10/08/2012   Procedure: LAPAROSCOPY OPERATIVE;REMOVAL OF [place] AND  REPLACEMENT OF [place];  Surgeon: Terrance Mass, MD;  Location: [facility];  Service: Gynecology;  Laterality: Left;  left upper arm and right upper arm  . LAPAROSCOPY N/A 10/25/2013   Procedure: LAPAROSCOPY DIAGNOSTIC WITH REMOVAL OF RIGHT PARATUBAL CYST, CHROMOPERTUBATION, RIGHT OVARIAN CYST DRAINAGE, left hydrosalpinx,lysis of adhesions;  Surgeon: Lyman Speller, MD;  Location: [place] [facility];  Service: Gynecology;  Laterality: N/A;  . nexplanon removed       OB History    Gravida  4   Para  1   Term  1   Preterm      AB  2   Living  1     SAB  1   TAB      Ectopic  1   Multiple      Live Births               Home Medications    Prior to Admission medications   Medication Sig Start Date End Date Taking? Authorizing Provider  albuterol (PROVENTIL HFA;VENTOLIN HFA) 108 (90 Base) MCG/ACT inhaler Inhale 1-2 puffs into the lungs every 6 (six) hours as needed for wheezing or shortness of breath.    [provider]  amoxicillin (AMOXIL) 500 MG capsule Take 1 capsule (500 mg total) by mouth  3 (three) times daily for 10 days. 09/02/17 09/12/17  Evalee Jefferson, PA-C  ibuprofen (ADVIL,MOTRIN) 800 MG tablet Take 1 tablet (800 mg total) by mouth 3 (three) times daily. 09/02/17   Evalee Jefferson, PA-C  magic mouthwash w/lidocaine SOLN Take 10 mLs by mouth 4 (four) times daily as needed (gargle and spit up to 4 times daily for throat pain relief). Note to pharmacy - equal parts diphendydramine, aluminum hydroxide and lidocaine HCL 09/02/17   IdolAlmyra Free, PA-C  NUVARING 0.12-0.015 MG/24HR vaginal ring Place 1 Dose vaginally every 30 (thirty) days. Insert 1 ring vaginally and leave in for 3 weeks, then remove and wait 1 week before inserting a new ring 07/29/17   [provider]  omeprazole (PRILOSEC) 20 MG capsule Take 20 mg by mouth daily.    [provider]  Prenatal Vit-Fe Fumarate-FA  (MULTIVITAMIN-PRENATAL) 27-0.8 MG TABS tablet Take 1 tablet by mouth daily at 12 noon.    [provider]  promethazine (PHENERGAN) 25 MG suppository Place 25 mg rectally every 6 (six) hours as needed for nausea or vomiting.    [provider]  sertraline (ZOLOFT) 50 MG tablet Take 50 mg by mouth daily. 07/27/17   [provider]    Family History Family History  Problem Relation Age of Onset  . Heart disease Mother   . Hypertension Mother   . Hyperlipidemia Father   . Hypertension Maternal Grandmother   . COPD Maternal Grandmother   . COPD Maternal Grandfather   . Hypertension Paternal Grandmother   . Diabetes Paternal Grandmother   . COPD Paternal Grandfather   . Cancer Sister   . Diabetes Other   . Hypertension Other     Social History Social History   Tobacco Use  . Smoking status: Never Smoker  . Smokeless tobacco: Never Used  Substance Use Topics  . Alcohol use: No    Comment: social -- 3-4 times per year   . Drug use: No     Allergies   Seroquel [quetiapine fumerate] and Tape   Review of Systems Review of Systems  Constitutional: Positive for fever. Negative for chills.  HENT: Positive for congestion, rhinorrhea and sore throat. Negative for ear pain, sinus pressure, trouble swallowing and voice change.   Eyes: Negative for discharge.  Respiratory: Negative for cough, shortness of breath, wheezing and stridor.   Cardiovascular: Negative for chest pain.  Gastrointestinal: Negative for abdominal pain.  Genitourinary: Negative.   Neurological: Negative for headaches.     Physical Exam Updated Vital Signs BP 130/79 (BP Location: Right Arm)   Pulse 80   Temp 98.5 F (36.9 C) (Oral)   Resp 14   Ht 5\' 8"  (1.727 m)   Wt 96.6 kg (213 lb)   LMP 08/21/2017 (Exact Date)   SpO2 99%   Breastfeeding? Unknown   BMI 32.39 kg/m   Physical Exam  Constitutional: She is oriented to person, place, and time. She appears well-developed and  well-nourished.  HENT:  Head: Normocephalic and atraumatic.  Right Ear: Tympanic membrane and ear canal normal.  Left Ear: Tympanic membrane and ear canal normal.  Nose: Mucosal edema and rhinorrhea present.  Mouth/Throat: Uvula is midline and mucous membranes are normal. No trismus in the jaw. Uvula swelling present. Posterior oropharyngeal erythema present. No oropharyngeal exudate, posterior oropharyngeal edema or tonsillar abscesses. Tonsils are 2+ on the right. Tonsils are 2+ on the left. No tonsillar exudate.  Eyes: Conjunctivae are normal.  Cardiovascular: Normal rate and normal heart  sounds.  Pulmonary/Chest: Effort normal. No respiratory distress. She has no wheezes. She has no rales.  Abdominal: Soft. There is no tenderness.  Musculoskeletal: Normal range of motion.  Neurological: She is alert and oriented to person, place, and time.  Skin: Skin is warm and dry. No rash noted.  Psychiatric: She has a normal mood and affect.     ED Treatments / Results  Labs (all labs ordered are listed, but only abnormal results are displayed) Labs Reviewed  GROUP A STREP BY PCR - Abnormal; Notable for the following components:      Result Value   Group A Strep by PCR DETECTED (*)    All other components within normal limits    EKG None  Radiology No results found.  Procedures Procedures (including critical care time)  Medications Ordered in ED Medications  amoxicillin (AMOXIL) capsule 500 mg (has no administration in time range)  ibuprofen (ADVIL,MOTRIN) tablet 800 mg (800 mg Oral Given 09/02/17 4540)     Initial Impression / Assessment and Plan / ED Course  I have reviewed the triage vital signs and the nursing notes.  Pertinent labs & imaging results that were available during my care of the patient were reviewed by me and considered in my medical decision making (see chart for details).     Pt positive for strep with uvulitis but tolerating PO intake.  Pt unable to take  steroids due to breast feeding. Ibuprofen, amoxil, magic mouthwash. Caution re gargle and spit only with magic mouthwash since it contains benadryl.  Close f/u for any worsened swelling, pain or new sx.   Final Clinical Impressions(s) / ED Diagnoses   Final diagnoses:  Strep throat  Uvulitis    ED Discharge Orders        Ordered    amoxicillin (AMOXIL) 500 MG capsule  3 times daily     09/02/17 1003    ibuprofen (ADVIL,MOTRIN) 800 MG tablet  3 times daily     09/02/17 1003    magic mouthwash w/lidocaine SOLN  4 times daily PRN     09/02/17 1003       Evalee Jefferson, Hershal Coria 09/02/17 1009    Milton Ferguson, MD 09/02/17 1521

## 2017-09-02 NOTE — Discharge Instructions (Addendum)
Use the medicines prescribed, completing the course of the antibiotics.  The magic mouthwash is safe to use as long and you are gargling and spitting out the liquid only, for pain relief.  It contains benadryl and swallowing this medicine can affect your breastmilk.  Get rechecked here or by your doctor for any worsening symptoms.

## 2017-09-02 NOTE — ED Triage Notes (Addendum)
Pt c/o fever and sore throat that began yesterday. Pt took Motrin at 0400 this morning. Swelling of uvula and redness of throat noted.

## 2017-09-03 ENCOUNTER — Telehealth: Payer: Self-pay | Admitting: Emergency Medicine

## 2017-09-03 NOTE — Telephone Encounter (Signed)
Post ED Visit - Positive Culture Follow-up  Culture report reviewed by antimicrobial stewardship pharmacist:  []  Elenor Quinones, Pharm.D. []  Heide Guile, Pharm.D., BCPS AQ-ID []  Parks Neptune, Pharm.D., BCPS []  Alycia Rossetti, Pharm.D., BCPS []  [place], Pharm.D., BCPS, AAHIVP []  Legrand Como, Pharm.D., BCPS, AAHIVP []  Salome Arnt, PharmD, BCPS []  Jalene Mullet, PharmD []  Vincenza Hews, PharmD, BCPS Jimmy Footman PharmD  Positive strep culture Treated with amoxicillin, organism sensitive to the same and no further patient follow-up is required at this time.  Hazle Nordmann 09/03/2017, 1:27 PM

## 2019-06-01 ENCOUNTER — Other Ambulatory Visit: Payer: Self-pay

## 2019-06-01 ENCOUNTER — Ambulatory Visit: Payer: Medicaid Other | Attending: Internal Medicine

## 2019-06-01 DIAGNOSIS — Z20822 Contact with and (suspected) exposure to covid-19: Secondary | ICD-10-CM

## 2019-06-02 LAB — NOVEL CORONAVIRUS, NAA: SARS-CoV-2, NAA: NOT DETECTED

## 2020-02-02 ENCOUNTER — Encounter (HOSPITAL_COMMUNITY): Payer: Self-pay | Admitting: Emergency Medicine

## 2020-02-02 ENCOUNTER — Observation Stay (HOSPITAL_COMMUNITY): Payer: No Typology Code available for payment source | Admitting: Anesthesiology

## 2020-02-02 ENCOUNTER — Observation Stay (HOSPITAL_COMMUNITY): Payer: No Typology Code available for payment source

## 2020-02-02 ENCOUNTER — Observation Stay (HOSPITAL_COMMUNITY)
Admission: EM | Admit: 2020-02-02 | Discharge: 2020-02-02 | Disposition: A | Discharge status: Home / Self Care | Payer: No Typology Code available for payment source | Attending: Surgery | Admitting: Surgery

## 2020-02-02 ENCOUNTER — Emergency Department (HOSPITAL_COMMUNITY): Payer: No Typology Code available for payment source

## 2020-02-02 ENCOUNTER — Other Ambulatory Visit: Payer: Self-pay

## 2020-02-02 ENCOUNTER — Encounter (HOSPITAL_COMMUNITY)
Admission: EM | Disposition: A | Discharge status: Home / Self Care | Payer: Self-pay | Source: Home / Self Care | Attending: Emergency Medicine

## 2020-02-02 DIAGNOSIS — K8050 Calculus of bile duct without cholangitis or cholecystitis without obstruction: Secondary | ICD-10-CM | POA: Diagnosis present

## 2020-02-02 DIAGNOSIS — M1990 Unspecified osteoarthritis, unspecified site: Secondary | ICD-10-CM | POA: Diagnosis not present

## 2020-02-02 DIAGNOSIS — R1011 Right upper quadrant pain: Secondary | ICD-10-CM

## 2020-02-02 DIAGNOSIS — Z20822 Contact with and (suspected) exposure to covid-19: Secondary | ICD-10-CM | POA: Diagnosis not present

## 2020-02-02 DIAGNOSIS — N809 Endometriosis, unspecified: Secondary | ICD-10-CM | POA: Insufficient documentation

## 2020-02-02 DIAGNOSIS — K8020 Calculus of gallbladder without cholecystitis without obstruction: Secondary | ICD-10-CM

## 2020-02-02 DIAGNOSIS — E039 Hypothyroidism, unspecified: Secondary | ICD-10-CM | POA: Diagnosis not present

## 2020-02-02 DIAGNOSIS — F329 Major depressive disorder, single episode, unspecified: Secondary | ICD-10-CM | POA: Diagnosis not present

## 2020-02-02 DIAGNOSIS — J449 Chronic obstructive pulmonary disease, unspecified: Secondary | ICD-10-CM | POA: Diagnosis not present

## 2020-02-02 DIAGNOSIS — K8000 Calculus of gallbladder with acute cholecystitis without obstruction: Principal | ICD-10-CM | POA: Insufficient documentation

## 2020-02-02 DIAGNOSIS — R109 Unspecified abdominal pain: Secondary | ICD-10-CM | POA: Diagnosis present

## 2020-02-02 DIAGNOSIS — F419 Anxiety disorder, unspecified: Secondary | ICD-10-CM | POA: Diagnosis not present

## 2020-02-02 DIAGNOSIS — Z419 Encounter for procedure for purposes other than remedying health state, unspecified: Secondary | ICD-10-CM

## 2020-02-02 HISTORY — PX: CHOLECYSTECTOMY: SHX55

## 2020-02-02 LAB — URINALYSIS, ROUTINE W REFLEX MICROSCOPIC
Bilirubin Urine: NEGATIVE
Glucose, UA: NEGATIVE mg/dL
Ketones, ur: NEGATIVE mg/dL
Nitrite: NEGATIVE
Protein, ur: NEGATIVE mg/dL
Specific Gravity, Urine: 1.009 (ref 1.005–1.030)
pH: 6 (ref 5.0–8.0)

## 2020-02-02 LAB — CBC
HCT: 42.3 % (ref 36.0–46.0)
Hemoglobin: 13.9 g/dL (ref 12.0–15.0)
MCH: 29.8 pg (ref 26.0–34.0)
MCHC: 32.9 g/dL (ref 30.0–36.0)
MCV: 90.6 fL (ref 80.0–100.0)
Platelets: 230 10*3/uL (ref 150–400)
RBC: 4.67 MIL/uL (ref 3.87–5.11)
RDW: 13.3 % (ref 11.5–15.5)
WBC: 10.5 10*3/uL (ref 4.0–10.5)
nRBC: 0 % (ref 0.0–0.2)

## 2020-02-02 LAB — COMPREHENSIVE METABOLIC PANEL
ALT: 17 U/L (ref 0–44)
AST: 18 U/L (ref 15–41)
Albumin: 4 g/dL (ref 3.5–5.0)
Alkaline Phosphatase: 171 U/L — ABNORMAL HIGH (ref 38–126)
Anion gap: 10 (ref 5–15)
BUN: 11 mg/dL (ref 6–20)
CO2: 24 mmol/L (ref 22–32)
Calcium: 9.4 mg/dL (ref 8.9–10.3)
Chloride: 103 mmol/L (ref 98–111)
Creatinine, Ser: 0.84 mg/dL (ref 0.44–1.00)
GFR calc Af Amer: >60 mL/min (ref >60)
GFR calc non Af Amer: >60 mL/min (ref >60)
Glucose, Bld: 104 mg/dL — ABNORMAL HIGH (ref 70–99)
Potassium: 3.4 mmol/L — ABNORMAL LOW (ref 3.5–5.1)
Sodium: 137 mmol/L (ref 135–145)
Total Bilirubin: 0.5 mg/dL (ref 0.3–1.2)
Total Protein: 7.8 g/dL (ref 6.5–8.1)

## 2020-02-02 LAB — SARS CORONAVIRUS 2 BY RT PCR (HOSPITAL ORDER, PERFORMED IN ~~LOC~~ HOSPITAL LAB): SARS Coronavirus 2: NEGATIVE

## 2020-02-02 LAB — HIV ANTIBODY (ROUTINE TESTING W REFLEX): HIV Screen 4th Generation wRfx: NONREACTIVE

## 2020-02-02 LAB — HCG, QUANTITATIVE, PREGNANCY: hCG, Beta Chain, Quant, S: <1 m[IU]/mL (ref <5)

## 2020-02-02 LAB — LIPASE, BLOOD: Lipase: 28 U/L (ref 11–51)

## 2020-02-02 SURGERY — LAPAROSCOPIC CHOLECYSTECTOMY WITH INTRAOPERATIVE CHOLANGIOGRAM
Anesthesia: General | Site: Abdomen

## 2020-02-02 MED ORDER — SODIUM CHLORIDE 0.9 % IV SOLN
INTRAVENOUS | Status: DC | PRN
  Administered 2020-02-02: 100 mL

## 2020-02-02 MED ORDER — ONDANSETRON HCL 4 MG/2ML IJ SOLN
4.0000 mg | Freq: Once | INTRAMUSCULAR | Status: AC | PRN
  Administered 2020-02-02: 4 mg via INTRAVENOUS

## 2020-02-02 MED ORDER — LACTATED RINGERS IV SOLN
INTRAVENOUS | Status: DC | PRN
  Administered 2020-02-02: 1000 mL

## 2020-02-02 MED ORDER — ROCURONIUM BROMIDE 10 MG/ML (PF) SYRINGE
PREFILLED_SYRINGE | INTRAVENOUS | Status: AC
  Filled 2020-02-02: qty 10

## 2020-02-02 MED ORDER — ROCURONIUM BROMIDE 10 MG/ML (PF) SYRINGE
PREFILLED_SYRINGE | INTRAVENOUS | Status: DC | PRN
  Administered 2020-02-02: 50 mg via INTRAVENOUS

## 2020-02-02 MED ORDER — DIPHENHYDRAMINE HCL 25 MG PO CAPS: 25.0000 mg | ORAL_CAPSULE | Freq: Four times a day (QID) | ORAL | Status: DC | PRN

## 2020-02-02 MED ORDER — DIPHENHYDRAMINE HCL 50 MG/ML IJ SOLN: 25.0000 mg | Freq: Four times a day (QID) | INTRAMUSCULAR | Status: DC | PRN

## 2020-02-02 MED ORDER — KETOROLAC TROMETHAMINE 30 MG/ML IJ SOLN: 30.0000 mg | Freq: Once | INTRAMUSCULAR | Status: DC | PRN

## 2020-02-02 MED ORDER — LIDOCAINE 2% (20 MG/ML) 5 ML SYRINGE
INTRAMUSCULAR | Status: AC
  Filled 2020-02-02: qty 5

## 2020-02-02 MED ORDER — IBUPROFEN 200 MG PO TABS: ORAL_TABLET | ORAL | Status: DC

## 2020-02-02 MED ORDER — PANTOPRAZOLE SODIUM 40 MG PO TBEC: 40.0000 mg | DELAYED_RELEASE_TABLET | Freq: Every day | ORAL | Status: DC

## 2020-02-02 MED ORDER — BUPIVACAINE-EPINEPHRINE 0.25% -1:200000 IJ SOLN
INTRAMUSCULAR | Status: DC | PRN
  Administered 2020-02-02: 30 mL

## 2020-02-02 MED ORDER — HYDROMORPHONE HCL 1 MG/ML IJ SOLN
0.5000 mg | Freq: Once | INTRAMUSCULAR | Status: AC
  Administered 2020-02-02: 0.5 mg via INTRAVENOUS
  Filled 2020-02-02: qty 1

## 2020-02-02 MED ORDER — ONDANSETRON HCL 4 MG/2ML IJ SOLN
INTRAMUSCULAR | Status: AC
  Filled 2020-02-02: qty 2

## 2020-02-02 MED ORDER — FENTANYL CITRATE (PF) 100 MCG/2ML IJ SOLN
INTRAMUSCULAR | Status: DC | PRN
Start: 2020-02-02 — End: 2020-02-02
  Administered 2020-02-02: 50 ug via INTRAVENOUS
  Administered 2020-02-02: 100 ug via INTRAVENOUS
  Administered 2020-02-02 (×4): 50 ug via INTRAVENOUS

## 2020-02-02 MED ORDER — MEPERIDINE HCL 50 MG/ML IJ SOLN: 6.2500 mg | INTRAMUSCULAR | Status: DC | PRN

## 2020-02-02 MED ORDER — FENTANYL CITRATE (PF) 100 MCG/2ML IJ SOLN
50.0000 ug | Freq: Once | INTRAMUSCULAR | Status: AC
  Administered 2020-02-02: 50 ug via INTRAVENOUS
  Filled 2020-02-02: qty 2

## 2020-02-02 MED ORDER — EPHEDRINE SULFATE-NACL 50-0.9 MG/10ML-% IV SOSY
PREFILLED_SYRINGE | INTRAVENOUS | Status: DC | PRN
  Administered 2020-02-02: 10 mg via INTRAVENOUS
  Administered 2020-02-02: 5 mg via INTRAVENOUS
  Administered 2020-02-02: 10 mg via INTRAVENOUS

## 2020-02-02 MED ORDER — ALBUTEROL SULFATE HFA 108 (90 BASE) MCG/ACT IN AERS: 1.0000 | INHALATION_SPRAY | Freq: Four times a day (QID) | RESPIRATORY_TRACT | Status: DC | PRN

## 2020-02-02 MED ORDER — OXYCODONE HCL 5 MG PO TABS: ORAL_TABLET | ORAL | 0 refills | Status: DC

## 2020-02-02 MED ORDER — METOPROLOL TARTRATE 5 MG/5ML IV SOLN: 5.0000 mg | Freq: Four times a day (QID) | INTRAVENOUS | Status: DC | PRN

## 2020-02-02 MED ORDER — SUGAMMADEX SODIUM 200 MG/2ML IV SOLN
INTRAVENOUS | Status: DC | PRN
  Administered 2020-02-02: 200 mg via INTRAVENOUS

## 2020-02-02 MED ORDER — ACETAMINOPHEN 500 MG PO TABS: 1000.0000 mg | ORAL_TABLET | Freq: Four times a day (QID) | ORAL | Status: DC

## 2020-02-02 MED ORDER — SCOPOLAMINE 1 MG/3DAYS TD PT72
MEDICATED_PATCH | TRANSDERMAL | Status: AC
  Administered 2020-02-02: 1.5 mg via TRANSDERMAL
  Filled 2020-02-02: qty 1

## 2020-02-02 MED ORDER — BUPIVACAINE HCL 0.25 % IJ SOLN
INTRAMUSCULAR | Status: AC
  Filled 2020-02-02: qty 1

## 2020-02-02 MED ORDER — SUCCINYLCHOLINE CHLORIDE 200 MG/10ML IV SOSY
PREFILLED_SYRINGE | INTRAVENOUS | Status: AC
  Filled 2020-02-02: qty 10

## 2020-02-02 MED ORDER — SODIUM CHLORIDE 0.9 % IV SOLN
2.0000 g | INTRAVENOUS | Status: DC
  Filled 2020-02-02: qty 20

## 2020-02-02 MED ORDER — FENTANYL CITRATE (PF) 100 MCG/2ML IJ SOLN
INTRAMUSCULAR | Status: AC
  Filled 2020-02-02: qty 2

## 2020-02-02 MED ORDER — OXYCODONE HCL 5 MG/5ML PO SOLN: 5.0000 mg | Freq: Once | ORAL | Status: AC | PRN

## 2020-02-02 MED ORDER — EPHEDRINE 5 MG/ML INJ
INTRAVENOUS | Status: AC
  Filled 2020-02-02: qty 10

## 2020-02-02 MED ORDER — DEXAMETHASONE SODIUM PHOSPHATE 10 MG/ML IJ SOLN
INTRAMUSCULAR | Status: DC | PRN
  Administered 2020-02-02: 5 mg via INTRAVENOUS

## 2020-02-02 MED ORDER — SERTRALINE HCL 50 MG PO TABS: 50.0000 mg | ORAL_TABLET | Freq: Every day | ORAL | Status: DC

## 2020-02-02 MED ORDER — PROPOFOL 10 MG/ML IV BOLUS
INTRAVENOUS | Status: DC | PRN
  Administered 2020-02-02: 150 mg via INTRAVENOUS
  Administered 2020-02-02: 50 mg via INTRAVENOUS

## 2020-02-02 MED ORDER — IBUPROFEN 800 MG PO TABS: 800.0000 mg | ORAL_TABLET | Freq: Three times a day (TID) | ORAL | Status: DC | PRN

## 2020-02-02 MED ORDER — LACTATED RINGERS IV SOLN: INTRAVENOUS | Status: DC

## 2020-02-02 MED ORDER — PROPOFOL 10 MG/ML IV BOLUS
INTRAVENOUS | Status: AC
  Filled 2020-02-02: qty 20

## 2020-02-02 MED ORDER — OMEPRAZOLE 20 MG PO CPDR: DELAYED_RELEASE_CAPSULE | ORAL | Status: DC

## 2020-02-02 MED ORDER — ONDANSETRON HCL 4 MG/2ML IJ SOLN
INTRAMUSCULAR | Status: DC | PRN
  Administered 2020-02-02: 4 mg via INTRAVENOUS

## 2020-02-02 MED ORDER — FENTANYL CITRATE (PF) 250 MCG/5ML IJ SOLN
INTRAMUSCULAR | Status: AC
  Filled 2020-02-02: qty 5

## 2020-02-02 MED ORDER — TRAMADOL HCL 50 MG PO TABS: 50.0000 mg | ORAL_TABLET | Freq: Four times a day (QID) | ORAL | Status: DC | PRN

## 2020-02-02 MED ORDER — MIDAZOLAM HCL 2 MG/2ML IJ SOLN
INTRAMUSCULAR | Status: AC
  Filled 2020-02-02: qty 2

## 2020-02-02 MED ORDER — ONDANSETRON 4 MG PO TBDP: 4.0000 mg | ORAL_TABLET | Freq: Four times a day (QID) | ORAL | Status: DC | PRN

## 2020-02-02 MED ORDER — DEXTROSE 5 % IV SOLN
INTRAVENOUS | Status: DC | PRN
  Administered 2020-02-02: 2 g via INTRAVENOUS

## 2020-02-02 MED ORDER — SODIUM CHLORIDE 0.9 % IV BOLUS
500.0000 mL | Freq: Once | INTRAVENOUS | Status: AC
  Administered 2020-02-02: 500 mL via INTRAVENOUS

## 2020-02-02 MED ORDER — MIDAZOLAM HCL 5 MG/5ML IJ SOLN
INTRAMUSCULAR | Status: DC | PRN
  Administered 2020-02-02: 2 mg via INTRAVENOUS

## 2020-02-02 MED ORDER — HYDROMORPHONE HCL 1 MG/ML IJ SOLN: 0.5000 mg | INTRAMUSCULAR | Status: DC | PRN

## 2020-02-02 MED ORDER — OXYCODONE HCL 5 MG PO TABS: 5.0000 mg | ORAL_TABLET | Freq: Once | ORAL | Status: AC | PRN

## 2020-02-02 MED ORDER — PHENYLEPHRINE 40 MCG/ML (10ML) SYRINGE FOR IV PUSH (FOR BLOOD PRESSURE SUPPORT)
PREFILLED_SYRINGE | INTRAVENOUS | Status: AC
  Filled 2020-02-02: qty 10

## 2020-02-02 MED ORDER — ONDANSETRON HCL 4 MG/2ML IJ SOLN
4.0000 mg | Freq: Once | INTRAMUSCULAR | Status: AC
  Administered 2020-02-02: 4 mg via INTRAVENOUS
  Filled 2020-02-02: qty 2

## 2020-02-02 MED ORDER — POLYETHYLENE GLYCOL 3350 17 G PO PACK: 17.0000 g | PACK | Freq: Every day | ORAL | Status: DC

## 2020-02-02 MED ORDER — ONDANSETRON HCL 4 MG/2ML IJ SOLN: 4.0000 mg | Freq: Four times a day (QID) | INTRAMUSCULAR | Status: DC | PRN

## 2020-02-02 MED ORDER — HYDROMORPHONE HCL 1 MG/ML IJ SOLN: 0.2500 mg | INTRAMUSCULAR | Status: DC | PRN

## 2020-02-02 MED ORDER — ACETAMINOPHEN 500 MG PO TABS: ORAL_TABLET | ORAL | 0 refills | Status: AC

## 2020-02-02 MED ORDER — KETOROLAC TROMETHAMINE 15 MG/ML IJ SOLN
INTRAMUSCULAR | Status: DC | PRN
  Administered 2020-02-02: 15 mg via INTRAVENOUS

## 2020-02-02 MED ORDER — OXYCODONE HCL 5 MG PO TABS
ORAL_TABLET | ORAL | Status: AC
Start: 2020-02-02 — End: 2020-02-02
  Administered 2020-02-02: 5 mg via ORAL
  Filled 2020-02-02: qty 1

## 2020-02-02 MED ORDER — SCOPOLAMINE 1 MG/3DAYS TD PT72: 1.0000 | MEDICATED_PATCH | TRANSDERMAL | Status: DC

## 2020-02-02 MED ORDER — ENOXAPARIN SODIUM 40 MG/0.4ML ~~LOC~~ SOLN: 40.0000 mg | SUBCUTANEOUS | Status: DC

## 2020-02-02 MED ORDER — SIMETHICONE 80 MG PO CHEW
40.0000 mg | CHEWABLE_TABLET | Freq: Four times a day (QID) | ORAL | Status: DC | PRN
  Filled 2020-02-02: qty 1

## 2020-02-02 MED ORDER — LIDOCAINE 2% (20 MG/ML) 5 ML SYRINGE
INTRAMUSCULAR | Status: DC | PRN
  Administered 2020-02-02: 100 mg via INTRAVENOUS

## 2020-02-02 MED ORDER — MEPERIDINE HCL 50 MG/ML IJ SOLN
INTRAMUSCULAR | Status: AC
  Administered 2020-02-02: 6.25 mg via INTRAVENOUS
  Filled 2020-02-02: qty 1

## 2020-02-02 MED ORDER — KCL IN DEXTROSE-NACL 20-5-0.45 MEQ/L-%-% IV SOLN: INTRAVENOUS | Status: DC

## 2020-02-02 SURGICAL SUPPLY — 46 items
ADH SKN CLS APL DERMABOND .7 (GAUZE/BANDAGES/DRESSINGS) ×1
APL PRP STRL LF DISP 70% ISPRP (MISCELLANEOUS) ×1
APPLIER CLIP 5 13 M/L LIGAMAX5 (MISCELLANEOUS) ×3
APPLIER CLIP ROT 10 11.4 M/L (STAPLE)
APR CLP MED LRG 11.4X10 (STAPLE)
APR CLP MED LRG 5 ANG JAW (MISCELLANEOUS) ×1
BAG SPEC RTRVL 10 TROC 200 (ENDOMECHANICALS) ×1
CABLE HIGH FREQUENCY MONO STRZ (ELECTRODE) ×3 IMPLANT
CHLORAPREP W/TINT 26 (MISCELLANEOUS) ×3 IMPLANT
CHOLANGIOGRAM CATH TAUT (CATHETERS) ×3 IMPLANT
CLIP APPLIE 5 13 M/L LIGAMAX5 (MISCELLANEOUS) IMPLANT
CLIP APPLIE ROT 10 11.4 M/L (STAPLE) IMPLANT
COVER MAYO STAND STRL (DRAPES) ×3 IMPLANT
COVER SURGICAL LIGHT HANDLE (MISCELLANEOUS) ×3 IMPLANT
COVER WAND RF STERILE (DRAPES) IMPLANT
DECANTER SPIKE VIAL GLASS SM (MISCELLANEOUS) ×3 IMPLANT
DERMABOND ADVANCED (GAUZE/BANDAGES/DRESSINGS) ×2
DERMABOND ADVANCED .7 DNX12 (GAUZE/BANDAGES/DRESSINGS) ×1 IMPLANT
DRAPE C-ARM 42X120 X-RAY (DRAPES) ×3 IMPLANT
ELECT REM PT RETURN 15FT ADLT (MISCELLANEOUS) ×3 IMPLANT
GLOVE SURG SYN 7.5  E (GLOVE) ×3
GLOVE SURG SYN 7.5 E (GLOVE) ×1 IMPLANT
GLOVE SURG SYN 7.5 PF PI (GLOVE) ×1 IMPLANT
GOWN STRL REUS W/TWL XL LVL3 (GOWN DISPOSABLE) ×9 IMPLANT
HEMOSTAT SURGICEL 4X8 (HEMOSTASIS) IMPLANT
IV CATH 14GX2 1/4 (CATHETERS) ×3 IMPLANT
IV SET EXTENSION CATH 6 NF (IV SETS) ×3 IMPLANT
KIT BASIN OR (CUSTOM PROCEDURE TRAY) ×3 IMPLANT
KIT TURNOVER KIT A (KITS) ×2 IMPLANT
POUCH RETRIEVAL ECOSAC 10 (ENDOMECHANICALS) ×1 IMPLANT
POUCH RETRIEVAL ECOSAC 10MM (ENDOMECHANICALS) ×3
SCISSORS LAP 5X35 DISP (ENDOMECHANICALS) ×3 IMPLANT
SET IRRIG TUBING LAPAROSCOPIC (IRRIGATION / IRRIGATOR) ×3 IMPLANT
SET TUBE SMOKE EVAC HIGH FLOW (TUBING) ×3 IMPLANT
SLEEVE ADV FIXATION 5X100MM (TROCAR) ×3 IMPLANT
SLEEVE TROCAR SHEATH 4.5 5.0 (SLEEVE) ×2 IMPLANT
STOPCOCK 4 WAY LG BORE MALE ST (IV SETS) ×3 IMPLANT
SUT MNCRL AB 4-0 PS2 18 (SUTURE) ×3 IMPLANT
SUT VICRYL 0 UR6 27IN ABS (SUTURE) ×2 IMPLANT
SYR 10ML ECCENTRIC (SYRINGE) ×3 IMPLANT
TOWEL OR 17X26 10 PK STRL BLUE (TOWEL DISPOSABLE) ×3 IMPLANT
TOWEL OR NON WOVEN STRL DISP B (DISPOSABLE) ×3 IMPLANT
TRAY LAPAROSCOPIC (CUSTOM PROCEDURE TRAY) ×3 IMPLANT
TROCAR ADV FIXATION 11X100MM (TROCAR) ×2 IMPLANT
TROCAR ADV FIXATION 5X100MM (TROCAR) ×3 IMPLANT
TROCAR XCEL BLUNT TIP 100MML (ENDOMECHANICALS) ×2 IMPLANT

## 2020-02-02 NOTE — ED Notes (Signed)
Dawn from [facility] called and states that they will pick pt up within the hour. Dawn was advised that the pt had water at bedside and her last sip was around 0745 this morning.

## 2020-02-02 NOTE — Anesthesia Preprocedure Evaluation (Addendum)
Anesthesia Evaluation  Patient identified by MRN, date of birth, ID band Patient awake    Reviewed: Allergy & Precautions, NPO status , Patient's Chart, lab work & pertinent test results  Airway Mallampati: II  TM Distance: >3 FB Neck ROM: Full    Dental no notable dental hx. (+) Teeth Intact, Dental Advisory Given   Pulmonary asthma ,    Pulmonary exam normal breath sounds clear to auscultation       Cardiovascular Exercise Tolerance: Good negative cardio ROS Normal cardiovascular exam Rhythm:Regular Rate:Normal     Neuro/Psych  Headaches, Anxiety    GI/Hepatic negative GI ROS, Neg liver ROS,   Endo/Other    Renal/GU negative Renal ROSK+ 3.4 Cr 0.84     Musculoskeletal negative musculoskeletal ROS (+)   Abdominal (+) + obese,   Peds  Hematology negative hematology ROS (+) hGB 13.9   Anesthesia Other Findings   Reproductive/Obstetrics negative OB ROS                            Anesthesia Physical Anesthesia Plan  ASA: II  Anesthesia Plan: General   Post-op Pain Management:    Induction: Intravenous  PONV Risk Score and Plan: Treatment may vary due to age or medical condition, Ondansetron, Dexamethasone and Midazolam  Airway Management Planned: Oral ETT  Additional Equipment: None  Intra-op Plan:   Post-operative Plan: Extubation in OR  Informed Consent: I have reviewed the patients History and Physical, chart, labs and discussed the procedure including the risks, benefits and alternatives for the proposed anesthesia with the patient or authorized representative who has indicated his/her understanding and acceptance.     Dental advisory given  Plan Discussed with: CRNA and Anesthesiologist  Anesthesia Plan Comments:        Anesthesia Quick Evaluation

## 2020-02-02 NOTE — Discharge Instructions (Signed)
[facility]  Please arrive at least 30 min before your appointment to complete your check in paperwork.  If you are unable to arrive 30 min prior to your appointment time we may have to cancel or reschedule you. LAPAROSCOPIC SURGERY: POST OP INSTRUCTIONS Always review your discharge instruction sheet given to you by the facility where your surgery was performed. IF YOU HAVE DISABILITY OR FAMILY LEAVE FORMS, YOU MUST BRING THEM TO THE OFFICE FOR PROCESSING.   DO NOT GIVE THEM TO YOUR DOCTOR.  PAIN CONTROL  1. First take acetaminophen (Tylenol) AND/or ibuprofen (Advil) to control your pain after surgery.  Follow directions on package.  Taking acetaminophen (Tylenol) and/or ibuprofen (Advil) regularly after surgery will help to control your pain and lower the amount of prescription pain medication you may need.  You should not take more than 4,000 mg (4 grams) of acetaminophen (Tylenol) in 24 hours.  You should not take ibuprofen (Advil), aleve, motrin, naprosyn or other NSAIDS if you have a history of stomach ulcers or chronic kidney disease.  2. A prescription for pain medication may be given to you upon discharge.  Take your pain medication as prescribed, if you still have uncontrolled pain after taking acetaminophen (Tylenol) or ibuprofen (Advil). 3. Use ice packs to help control pain. 4. If you need a refill on your pain medication, please contact your pharmacy.  They will contact our office to request authorization. Prescriptions will not be filled after 5pm or on week-ends.  HOME MEDICATIONS 5. Take your usually prescribed medications unless otherwise directed.  DIET 6. You should follow a light diet the first few days after arrival home.  Be sure to include lots of fluids daily. Avoid fatty, fried foods.   CONSTIPATION 7. It is common to experience some constipation after surgery and if you are taking pain medication.  Increasing fluid intake and taking a stool  softener (such as Colace) will usually help or prevent this problem from occurring.  A mild laxative (Milk of Magnesia or Miralax) should be taken according to package instructions if there are no bowel movements after 48 hours.  WOUND/INCISION CARE 8. Most patients will experience some swelling and bruising in the area of the incisions.  Ice packs will help.  Swelling and bruising can take several days to resolve.  9. Unless discharge instructions indicate otherwise, follow guidelines below  a. STERI-STRIPS - you may remove your outer bandages 48 hours after surgery, and you may shower at that time.  You have steri-strips (small skin tapes) in place directly over the incision.  These strips should be left on the skin for 7-10 days.   b. DERMABOND/SKIN GLUE - you may shower in 24 hours.  The glue will flake off over the next 2-3 weeks. 10. Any sutures or staples will be removed at the office during your follow-up visit.  ACTIVITIES 11. You may resume regular (light) daily activities beginning the next day--such as daily self-care, walking, climbing stairs--gradually increasing activities as tolerated.  You may have sexual intercourse when it is comfortable.  Refrain from any heavy lifting or straining until approved by your doctor. a. You may drive when you are no longer taking prescription pain medication, you can comfortably wear a seatbelt, and you can safely maneuver your car and apply brakes.  FOLLOW-UP 12. You should see your doctor in the office for a follow-up appointment approximately 2-3 weeks after your surgery.  You should have been given your post-op/follow-up appointment when   your surgery was scheduled.  If you did not receive a post-op/follow-up appointment, make sure that you call for this appointment within a day or two after you arrive home to insure a convenient appointment time.   WHEN TO CALL YOUR DOCTOR: 1. Fever over 101.0 2. Inability to urinate 3. Continued bleeding from  incision. 4. Increased pain, redness, or drainage from the incision. 5. Increasing abdominal pain  The clinic staff is available to answer your questions during regular business hours.  Please don't hesitate to call and ask to speak to one of the nurses for clinical concerns.  If you have a medical emergency, go to the nearest emergency room or call 911.  A surgeon from [facility] is always on call at the hospital. [address], [address], [place], [place]  [address] ? [address] [address], [place], [place]   [address] [phone] ? [phone] ? FAX [phone]  .........   Managing Your Pain After Surgery Without Opioids    Thank you for participating in our program to help patients manage their pain after surgery without opioids. This is part of our effort to provide you with the best care possible, without exposing you or your family to the risk that opioids pose.  What pain can I expect after surgery? You can expect to have some pain after surgery. This is normal. The pain is typically worse the day after surgery, and quickly begins to get better. Many studies have found that many patients are able to manage their pain after surgery with Over-the-Counter (OTC) medications such as Tylenol and Motrin. If you have a condition that does not allow you to take Tylenol or Motrin, notify your surgical team.  How will I manage my pain? The best strategy for controlling your pain after surgery is around the clock pain control with Tylenol (acetaminophen) and Motrin (ibuprofen or Advil). Alternating these medications with each other allows you to maximize your pain control. In addition to Tylenol and Motrin, you can use heating pads or ice packs on your incisions to help reduce your pain.  How will I alternate your regular strength over-the-counter pain medication? You will take a dose of pain medication every three hours. ; Start by taking 650 mg of Tylenol (2 pills of 325  mg) ; 3 hours later take 600 mg of Motrin (3 pills of 200 mg) ; 3 hours after taking the Motrin take 650 mg of Tylenol ; 3 hours after that take 600 mg of Motrin.   - 1 -  See example - if your first dose of Tylenol is at 12:00 PM   12:00 PM Tylenol 650 mg (2 pills of 325 mg)  3:00 PM Motrin 600 mg (3 pills of 200 mg)  6:00 PM Tylenol 650 mg (2 pills of 325 mg)  9:00 PM Motrin 600 mg (3 pills of 200 mg)  Continue alternating every 3 hours   We recommend that you follow this schedule around-the-clock for at least 3 days after surgery, or until you feel that it is no longer needed. Use the table on the last page of this handout to keep track of the medications you are taking. Important: Do not take more than 3000mg of Tylenol or 3200mg of Motrin in a 24-hour period. Do not take ibuprofen/Motrin if you have a history of bleeding stomach ulcers, severe kidney disease, &/or actively taking a blood thinner  What if I still have pain? If you have pain that is not   controlled with the over-the-counter pain medications (Tylenol and Motrin or Advil) you might have what we call "breakthrough" pain. You will receive a prescription for a small amount of an opioid pain medication such as Oxycodone, Tramadol, or Tylenol with Codeine. Use these opioid pills in the first 24 hours after surgery if you have breakthrough pain. Do not take more than 1 pill every 4-6 hours.  If you still have uncontrolled pain after using all opioid pills, don't hesitate to call our staff using the number provided. We will help make sure you are managing your pain in the best way possible, and if necessary, we can provide a prescription for additional pain medication.   Day 1    Time  Name of Medication Number of pills taken  Amount of Acetaminophen  Pain Level   Comments  AM PM       AM PM       AM PM       AM PM       AM PM       AM PM       AM PM       AM PM       Total Daily amount of Acetaminophen Do not  take more than  3,000 mg per day      Day 2    Time  Name of Medication Number of pills taken  Amount of Acetaminophen  Pain Level   Comments  AM PM       AM PM       AM PM       AM PM       AM PM       AM PM       AM PM       AM PM       Total Daily amount of Acetaminophen Do not take more than  3,000 mg per day      Day 3    Time  Name of Medication Number of pills taken  Amount of Acetaminophen  Pain Level   Comments  AM PM       AM PM       AM PM       AM PM          AM PM       AM PM       AM PM       AM PM       Total Daily amount of Acetaminophen Do not take more than  3,000 mg per day      Day 4    Time  Name of Medication Number of pills taken  Amount of Acetaminophen  Pain Level   Comments  AM PM       AM PM       AM PM       AM PM       AM PM       AM PM       AM PM       AM PM       Total Daily amount of Acetaminophen Do not take more than  3,000 mg per day      Day 5    Time  Name of Medication Number of pills taken  Amount of Acetaminophen  Pain Level   Comments  AM PM       AM PM       AM   PM       AM PM       AM PM       AM PM       AM PM       AM PM       Total Daily amount of Acetaminophen Do not take more than  3,000 mg per day       Day 6    Time  Name of Medication Number of pills taken  Amount of Acetaminophen  Pain Level  Comments  AM PM       AM PM       AM PM       AM PM       AM PM       AM PM       AM PM       AM PM       Total Daily amount of Acetaminophen Do not take more than  3,000 mg per day      Day 7    Time  Name of Medication Number of pills taken  Amount of Acetaminophen  Pain Level   Comments  AM PM       AM PM       AM PM       AM PM       AM PM       AM PM       AM PM       AM PM       Total Daily amount of Acetaminophen Do not take more than  3,000 mg per day        For additional information about how and where to safely dispose of unused  opioid medications - https://www.morepowerfulnc.org  Disclaimer: This document contains information and/or instructional materials adapted from [facility] for the typical patient with your condition. It does not replace medical advice from your health care provider because your experience may differ from that of the typical patient. Talk to your health care provider if you have any questions about this document, your condition or your treatment plan. Adapted from [facility]   

## 2020-02-02 NOTE — H&P (Addendum)
Erika Armstrong Barnett Abu 1991-02-14  [id number].    Chief Complaint/Reason for Consult: biliary colic  HPI:   This is a 29 yo white female who is 9 weeks post partum who has a history of ectopic pregnancy, anxiety/depression, and asthma who has been having "indigestion" recently.  She has had it the last couple of nights, but took some medicine for it, although with not much relief.  Last night, this returned after eating pizza, around 2000 and very quickly intensified.  She developed nausea, but no emesis.   Denies diarrhea, fevers, chills, CP, SOB, dysuria.  Nothing made her pain better at home.  Because this continued to progress, she presented to [facility] where she has essentially normal labs, except a mildly elevated ALK phos.  She has an [place] that reveals a gallstone lodged in the neck of the gallbladder but no signs of cholecystitis or choledocholithiasis.  We have been asked to see her for further evaluation.  ROS: ROS: Please see HPI, otherwise all other systems have been reviewed and are negative, except recent PACs and PVCs.  She is breastfeeding and 9 weeks post partum.  Family History  Problem Relation Age of Onset  . Heart disease Mother   . Hypertension Mother   . Hyperlipidemia Father   . Hypertension Maternal Grandmother   . COPD Maternal Grandmother   . COPD Maternal Grandfather   . Hypertension Paternal Grandmother   . Diabetes Paternal Grandmother   . COPD Paternal Grandfather   . Cancer Sister   . Diabetes Other   . Hypertension Other     Past Medical History:  Diagnosis Date  . Allergy   . Anxiety    and panic attacks  . Arthritis    wrist, knees - otc med prn  . Asthma    no inhaler - uses nebalizer  . Bronchitis    history - per pt  . Depression   . Endometriosis   . Headache(784.0)    otc med prn  . Ovarian cyst   . Seasonal allergies   . Thyroid disease    Hypothyroid - pt states no med - saw 2nd opinion - neg     Past Surgical History:  Procedure  Laterality Date  . DERMOID CYST  EXCISION     right side  . DIAGNOSTIC LAPAROSCOPY    . DILATION AND CURETTAGE OF UTERUS  10/25/2013   Procedure: DILATATION AND CURETTAGE;  Surgeon: Lyman Speller, MD;  Location: [facility];  Service: Gynecology;;  . I & D EXTREMITY Right 04/06/2013   Procedure: REMOVAL OF IMPLANT RIGHT ARM--IMPLANT DISCARDED;  Surgeon: Cammie Sickle., MD;  Location: [facility];  Service: Orthopedics;  Laterality: Right;  . LAPAROSCOPY Left 10/08/2012   Procedure: LAPAROSCOPY OPERATIVE;REMOVAL OF [place] AND  REPLACEMENT OF [place];  Surgeon: Terrance Mass, MD;  Location: [facility];  Service: Gynecology;  Laterality: Left;  left upper arm and right upper arm  . LAPAROSCOPY N/A 10/25/2013   Procedure: LAPAROSCOPY DIAGNOSTIC WITH REMOVAL OF RIGHT PARATUBAL CYST, CHROMOPERTUBATION, RIGHT OVARIAN CYST DRAINAGE, left hydrosalpinx,lysis of adhesions;  Surgeon: Lyman Speller, MD;  Location: [facility];  Service: Gynecology;  Laterality: N/A;  . nexplanon removed      Social History:  reports that she has never smoked. She has never used smokeless tobacco. She reports that she does not drink alcohol and does not use drugs.  Allergies:  Allergies  Allergen Reactions  . Quetiapine Swelling  MOUTH & THROAT  . Seroquel [Quetiapine Fumerate] Swelling    Mouth numbness  . Prochlorperazine Anxiety  . Tape Rash    (Not in a hospital admission)    Physical Exam: Blood pressure 110/71, pulse (!) 57, temperature 98.2 F (36.8 C), temperature source Oral, resp. rate 15, height '5\' 8"'  (1.727 m), weight 101.2 kg, SpO2 98 %, unknown if currently breastfeeding. General: pleasant, WD, WN, mild overweight white female who is sitting up on the bedside and uncomfortable HEENT: head is normocephalic, atraumatic.  Sclera are noninjected.  PERRL.  Ears and nose without any masses or lesions.  Mouth is pink and moist Heart: regular, rate, and rhythm.  Normal  s1,s2. No obvious murmurs, gallops, or rubs noted.  Palpable radial and pedal pulses bilaterally Lungs: CTAB, no wheezes, rhonchi, or rales noted.  Respiratory effort nonlabored Abd: soft, tender more in epigastrium than RUQ, ND, +BS, no masses, hernias, or organomegaly.  Multiple scars noted from prior laparoscopies, especially periumbilical MS: all 4 extremities are symmetrical with no cyanosis, clubbing, or edema. Skin: warm and dry with no masses, lesions, or rashes Neuro: Cranial nerves 2-12 grossly intact, sensation is normal throughout Psych: A&Ox3 with an appropriate affect.   Results for orders placed or performed during the hospital encounter of 02/02/20 (from the past 48 hour(s))  hCG, quantitative, pregnancy     Status: None   Collection Time: 02/02/20  2:35 AM  Result Value Ref Range   hCG, Beta Chain, Quant, S <1 <5 mIU/mL    Comment:          GEST. AGE      CONC.  (mIU/mL)   <=1 WEEK        5 - 50     2 WEEKS       50 - 500     3 WEEKS       100 - 10,000     4 WEEKS     1,000 - 30,000     5 WEEKS     3,500 - 115,000   6-8 WEEKS     12,000 - 270,000    12 WEEKS     15,000 - 220,000        FEMALE AND NON-PREGNANT FEMALE:     LESS THAN 5 mIU/mL Performed at [facility], [place] [address], [place], [place] [address]   Lipase, blood     Status: None   Collection Time: 02/02/20  2:54 AM  Result Value Ref Range   Lipase 28 11 - 51 U/L    Comment: Performed at [facility], [place] [address] [place], [place] [address]  Comprehensive metabolic panel     Status: Abnormal   Collection Time: 02/02/20  2:54 AM  Result Value Ref Range   Sodium 137 135 - 145 mmol/L   Potassium 3.4 (L) 3.5 - 5.1 mmol/L   Chloride 103 98 - 111 mmol/L   CO2 24 22 - 32 mmol/L   Glucose, Bld 104 (H) 70 - 99 mg/dL    Comment: Glucose reference range applies only to samples taken after fasting for at least 8 hours.   BUN 11 6 - 20 mg/dL   Creatinine, Ser 0.84 0.44 -  1.00 mg/dL   Calcium 9.4 8.9 - 10.3 mg/dL   Total Protein 7.8 6.5 - 8.1 g/dL   Albumin 4.0 3.5 - 5.0 g/dL   AST 18 15 - 41 U/L   ALT 17 0 - 44 U/L  Alkaline Phosphatase 171 (H) 38 - 126 U/L   Total Bilirubin 0.5 0.3 - 1.2 mg/dL   GFR calc non Af Amer >60 >60 mL/min   GFR calc Af Amer >60 >60 mL/min   Anion gap 10 5 - 15    Comment: Performed at [facility], [place] [address], [place], [place] [address]  CBC     Status: None   Collection Time: 02/02/20  2:54 AM  Result Value Ref Range   WBC 10.5 4.0 - 10.5 K/uL   RBC 4.67 3.87 - 5.11 MIL/uL   Hemoglobin 13.9 12.0 - 15.0 g/dL   HCT 42.3 36 - 46 %   MCV 90.6 80.0 - 100.0 fL   MCH 29.8 26.0 - 34.0 pg   MCHC 32.9 30.0 - 36.0 g/dL   RDW 13.3 11.5 - 15.5 %   Platelets 230 150 - 400 K/uL   nRBC 0.0 0.0 - 0.2 %    Comment: Performed at [facility], [place] [address] [place], [place] [address]  Urinalysis, Routine w reflex microscopic     Status: Abnormal   Collection Time: 02/02/20  6:46 AM  Result Value Ref Range   Color, Urine YELLOW YELLOW   APPearance CLEAR CLEAR   Specific Gravity, Urine 1.009 1.005 - 1.030   pH 6.0 5.0 - 8.0   Glucose, UA NEGATIVE NEGATIVE mg/dL   Hgb urine dipstick SMALL (A) NEGATIVE   Bilirubin Urine NEGATIVE NEGATIVE   Ketones, ur NEGATIVE NEGATIVE mg/dL   Protein, ur NEGATIVE NEGATIVE mg/dL   Nitrite NEGATIVE NEGATIVE   Leukocytes,Ua LARGE (A) NEGATIVE   RBC / HPF 0-5 0 - 5 RBC/hpf   WBC, UA 21-50 0 - 5 WBC/hpf   Bacteria, UA RARE (A) NONE SEEN   Squamous Epithelial / LPF 0-5 0 - 5   Mucus PRESENT     Comment: Performed at [facility], [place] [address], [place], [place] [address]   US Abdomen Limited RUQ  Result Date: 02/02/2020 CLINICAL DATA:  Right upper quadrant pain for 8 hours EXAM: ULTRASOUND ABDOMEN LIMITED RIGHT UPPER QUADRANT COMPARISON:  10/01/2013 FINDINGS: Gallbladder: Layering gallstones measuring up to 12 mm. A stone is also seen  fixed at the gallbladder neck. The gallbladder is full without wall thickening or pericholecystic edema. Negative reported sonographic Murphy sign. Common bile duct: Diameter: 4 mm.  Where visualized, no filling defect. Liver: Subcapsular posterior right liver echogenic mass measuring 13 mm, not seen on a 2015 abdominal CT - although single-phase. No signs of cirrhosis and no reported history of malignancy. Portal vein is patent on color Doppler imaging with normal direction of blood flow towards the liver. IMPRESSION: 1. Cholelithiasis including a stone at the gallbladder neck. The gallbladder is full but there is no inflammatory findings of acute cholecystitis. 2. 13 mm echogenic right liver mass, nonspecific but classically hemangioma in a young female. Electronically Signed   By: Monte Fantasia M.D.   On: 02/02/2020 05:36    Assessment/Plan Anxiety/Depression - continue zoloft Asthma - continue albuterol prn Breastfeeding  Biliary colic  The patient has a stone lodged in the neck of her gallbladder which is causing persistent pain.  We will plan to admit the patient for lap chole today.  She will be given Rocephin, which is safe in breastfeeding patients, pre-op for prophylaxis.  Pending timing of surgery, we discussed the possibility of home later today vs tomorrow morning.  I have explained the procedure, risks, and aftercare of  cholecystectomy.  Risks include but are not limited to bleeding, infection, wound problems, anesthesia, diarrhea, bile leak, injury to common bile duct/liver/intestine.  She seems to understand and agrees to proceed.  FEN - NPO/IVFs VTE - lovenox to start tomorrow post op if still here ID - rocephin on call to [facility] - observation  Henreitta Cea, [facility] 02/02/2020, 8:59 AM Please see Amion for pager number during day hours 7:00am-4:30pm or 7:00am -11:30am on weekends  Agree with above. She is 9 weeks post partum.  Her son's name is Will.   She is breast feeding. She has two other children - daughter 19/2 and son 2 1/2. Her husband is in the waiting area. She's had several gyn operations, including an ectopic pregnancy about 5 1/2 years ago.  I discussed with the patient the indications and risks of gall bladder surgery.  The primary risks of gall bladder surgery include, but are not limited to, bleeding, infection, common bile duct injury, and open surgery.  There is also the risk that the patient may have continued symptoms after surgery.  We discussed the typical post-operative recovery course. I tried to answer the patient's questions.  Alphonsa Overall, MD, [facility] Office phone:  [phone]

## 2020-02-02 NOTE — Transfer of Care (Signed)
Immediate Anesthesia Transfer of Care Note  Patient: Erika Armstrong  Procedure(s) Performed: LAPAROSCOPIC CHOLECYSTECTOMY WITH INTRAOPERATIVE CHOLANGIOGRAM (N/A Abdomen)  Patient Location: PACU  Anesthesia Type:General  Level of Consciousness: sedated, patient cooperative and responds to stimulation  Airway & Oxygen Therapy: Patient Spontanous Breathing and Patient connected to face mask oxygen  Post-op Assessment: Report given to RN and Post -op Vital signs reviewed and stable  Post vital signs: Reviewed and stable  Last Vitals:  Vitals Value Taken Time  BP    Temp    Pulse    Resp    SpO2      Last Pain:  Vitals:   02/02/20 1139  TempSrc: Oral  PainSc:       Patients Stated Pain Goal: 0 (83/72/90 2111)  Complications: No complications documented.

## 2020-02-02 NOTE — ED Provider Notes (Signed)
[facility] DEPT Provider Note  CSN: [id number] Arrival date & time: 02/02/20 4008  Chief Complaint(s) Abdominal Pain  HPI Erika Armstrong is a 29 y.o. female who presents to the emergency department with right upper quadrant aching pain that began at approximately 8 PM last night, which was 1 or 2 hours after dinner.  Pain has been constant but fluctuating.  Pain worse with movement and palpation.  No alleviating factors.  Associated nausea without emesis.  No chest pain or shortness of breath.  No diarrhea.  No urinary symptoms.  Patient is 9 weeks postpartum.  Reports having gestational hypertension and having to be induced at 37 weeks.  Otherwise no complications with the pregnancy or birth.  HPI  Past Medical History Past Medical History:  Diagnosis Date  . Allergy   . Anxiety    and panic attacks  . Arthritis    wrist, knees - otc med prn  . Asthma    no inhaler - uses nebalizer  . Bronchitis    history - per pt  . Depression   . Endometriosis   . Headache(784.0)    otc med prn  . Ovarian cyst   . Seasonal allergies   . Thyroid disease    Hypothyroid - pt states no med - saw 2nd opinion - neg    Patient Active Problem List   Diagnosis Date Noted  . History of ectopic pregnancy 10/28/2013  . Miscarriage 10/21/2013  . Asthma, chronic 04/26/2013  . Endometriosis 10/20/2012  . Ovarian cyst 02/15/2011   Home Medication(s) Prior to Admission medications   Medication Sig Start Date End Date Taking? Authorizing Provider  albuterol (PROVENTIL HFA;VENTOLIN HFA) 108 (90 Base) MCG/ACT inhaler Inhale 1-2 puffs into the lungs every 6 (six) hours as needed for wheezing or shortness of breath.    [provider]  ibuprofen (ADVIL,MOTRIN) 800 MG tablet Take 1 tablet (800 mg total) by mouth 3 (three) times daily. 09/02/17   Evalee Jefferson, PA-C  magic mouthwash w/lidocaine SOLN Take 10 mLs by mouth 4 (four) times daily as needed (gargle and  spit up to 4 times daily for throat pain relief). Note to pharmacy - equal parts diphendydramine, aluminum hydroxide and lidocaine HCL 09/02/17   IdolAlmyra Free, PA-C  NUVARING 0.12-0.015 MG/24HR vaginal ring Place 1 Dose vaginally every 30 (thirty) days. Insert 1 ring vaginally and leave in for 3 weeks, then remove and wait 1 week before inserting a new ring 07/29/17   [provider]  omeprazole (PRILOSEC) 20 MG capsule Take 20 mg by mouth daily.    [provider]  Prenatal Vit-Fe Fumarate-FA (MULTIVITAMIN-PRENATAL) 27-0.8 MG TABS tablet Take 1 tablet by mouth daily at 12 noon.    [provider]  promethazine (PHENERGAN) 25 MG suppository Place 25 mg rectally every 6 (six) hours as needed for nausea or vomiting.    [provider]  sertraline (ZOLOFT) 50 MG tablet Take 50 mg by mouth daily. 07/27/17   [provider]  Past Surgical History Past Surgical History:  Procedure Laterality Date  . DERMOID CYST  EXCISION     right side  . DIAGNOSTIC LAPAROSCOPY    . DILATION AND CURETTAGE OF UTERUS  10/25/2013   Procedure: DILATATION AND CURETTAGE;  Surgeon: Lyman Speller, MD;  Location: [facility];  Service: Gynecology;;  . I & D EXTREMITY Right 04/06/2013   Procedure: REMOVAL OF IMPLANT RIGHT ARM--IMPLANT DISCARDED;  Surgeon: Cammie Sickle., MD;  Location: [facility];  Service: Orthopedics;  Laterality: Right;  . LAPAROSCOPY Left 10/08/2012   Procedure: LAPAROSCOPY OPERATIVE;REMOVAL OF [place] AND  REPLACEMENT OF [place];  Surgeon: Terrance Mass, MD;  Location: [facility];  Service: Gynecology;  Laterality: Left;  left upper arm and right upper arm  . LAPAROSCOPY N/A 10/25/2013   Procedure: LAPAROSCOPY DIAGNOSTIC WITH REMOVAL OF RIGHT PARATUBAL CYST, CHROMOPERTUBATION, RIGHT OVARIAN CYST DRAINAGE, left  hydrosalpinx,lysis of adhesions;  Surgeon: Lyman Speller, MD;  Location: [facility];  Service: Gynecology;  Laterality: N/A;  . nexplanon removed     Family History Family History  Problem Relation Age of Onset  . Heart disease Mother   . Hypertension Mother   . Hyperlipidemia Father   . Hypertension Maternal Grandmother   . COPD Maternal Grandmother   . COPD Maternal Grandfather   . Hypertension Paternal Grandmother   . Diabetes Paternal Grandmother   . COPD Paternal Grandfather   . Cancer Sister   . Diabetes Other   . Hypertension Other     Social History Social History   Tobacco Use  . Smoking status: Never Smoker  . Smokeless tobacco: Never Used  Vaping Use  . Vaping Use: Never used  Substance Use Topics  . Alcohol use: No    Comment: social -- 3-4 times per year   . Drug use: No   Allergies Quetiapine, Seroquel [quetiapine fumerate], Prochlorperazine, and Tape  Review of Systems Review of Systems All other systems are reviewed and are negative for acute change except as noted in the HPI  Physical Exam Vital Signs  I have reviewed the triage vital signs BP 115/69   Pulse 62   Temp 98.2 F (36.8 C) (Oral)   Resp 17   Ht '5\' 8"'  (1.727 m)   Wt 101.2 kg   SpO2 96%   BMI 33.91 kg/m   Physical Exam Vitals reviewed.  Constitutional:      General: She is not in acute distress.    Appearance: She is well-developed. She is not diaphoretic.  HENT:     Head: Normocephalic and atraumatic.     Right Ear: External ear normal.     Left Ear: External ear normal.     Nose: Nose normal.  Eyes:     General: No scleral icterus.    Conjunctiva/sclera: Conjunctivae normal.  Neck:     Trachea: Phonation normal.  Cardiovascular:     Rate and Rhythm: Normal rate and regular rhythm.  Pulmonary:     Effort: Pulmonary effort is normal. No respiratory distress.     Breath sounds: No stridor.  Abdominal:     General: There is no distension.     Tenderness: There is  abdominal tenderness in the right upper quadrant. There is no guarding or rebound. Negative signs include Murphy's sign.  Musculoskeletal:        General: Normal range of motion.     Cervical back: Normal range of motion.  Neurological:     Mental Status:  She is alert and oriented to person, place, and time.  Psychiatric:        Behavior: Behavior normal.     ED Results and Treatments Labs (all labs ordered are listed, but only abnormal results are displayed) Labs Reviewed  COMPREHENSIVE METABOLIC PANEL - Abnormal; Notable for the following components:      Result Value   Potassium 3.4 (*)    Glucose, Bld 104 (*)    Alkaline Phosphatase 171 (*)    All other components within normal limits  LIPASE, BLOOD  CBC  HCG, QUANTITATIVE, PREGNANCY  URINALYSIS, ROUTINE W REFLEX MICROSCOPIC                                                                                                                         EKG  EKG Interpretation  Date/Time:    Ventricular Rate:    PR Interval:    QRS Duration:   QT Interval:    QTC Calculation:   R Axis:     Text Interpretation:        Radiology US Abdomen Limited RUQ  Result Date: 02/02/2020 CLINICAL DATA:  Right upper quadrant pain for 8 hours EXAM: ULTRASOUND ABDOMEN LIMITED RIGHT UPPER QUADRANT COMPARISON:  10/01/2013 FINDINGS: Gallbladder: Layering gallstones measuring up to 12 mm. A stone is also seen fixed at the gallbladder neck. The gallbladder is full without wall thickening or pericholecystic edema. Negative reported sonographic Murphy sign. Common bile duct: Diameter: 4 mm.  Where visualized, no filling defect. Liver: Subcapsular posterior right liver echogenic mass measuring 13 mm, not seen on a 2015 abdominal CT - although single-phase. No signs of cirrhosis and no reported history of malignancy. Portal vein is patent on color Doppler imaging with normal direction of blood flow towards the liver. IMPRESSION: 1. Cholelithiasis including  a stone at the gallbladder neck. The gallbladder is full but there is no inflammatory findings of acute cholecystitis. 2. 13 mm echogenic right liver mass, nonspecific but classically hemangioma in a young female. Electronically Signed   By: Monte Fantasia M.D.   On: 02/02/2020 05:36    Pertinent labs & imaging results that were available during my care of the patient were reviewed by me and considered in my medical decision making (see chart for details).  Medications Ordered in ED Medications  sodium chloride 0.9 % bolus 500 mL (0 mLs Intravenous Stopped 02/02/20 0505)  fentaNYL (SUBLIMAZE) injection 50 mcg (50 mcg Intravenous Given 02/02/20 0357)  ondansetron (ZOFRAN) injection 4 mg (4 mg Intravenous Given 02/02/20 0357)  fentaNYL (SUBLIMAZE) injection 50 mcg (50 mcg Intravenous Given 02/02/20 0511)  HYDROmorphone (DILAUDID) injection 0.5 mg (0.5 mg Intravenous Given 02/02/20 0701)  Procedures Ultrasound ED Abd  Date/Time: 02/02/2020 5:56 AM Performed by: Fatima Blank, MD Authorized by: Fatima Blank, MD   Procedure details:    Indications: abdominal pain     Assessment for:  Gallstones and hydronephrosis   Left renal:  Visualized   Right renal:  Visualized   Hepatobiliary:  Visualized       Left renal findings:    Hydronephrosis: none   Right renal findings:    Hydronephrosis: none   Hepatobiliary findings:    Common bile duct:  Unable to visualize   Gallbladder wall:  Normal   Gallbladder stones: identified     Intra-abdominal fluid: not identified     Sonographic Murphy's sign: negative      (including critical care time)  Medical Decision Making / ED Course I have reviewed the nursing notes for this encounter and the patient's prior records (if available in EHR or on provided paperwork).   Erika Armstrong was evaluated  in Emergency Department on 02/02/2020 for the symptoms described in the history of present illness. She was evaluated in the context of the global COVID-19 pandemic, which necessitated consideration that the patient might be at risk for infection with the SARS-CoV-2 virus that causes COVID-19. Institutional protocols and algorithms that pertain to the evaluation of patients at risk for COVID-19 are in a state of rapid change based on information released by regulatory bodies including the CDC and federal and state organizations. These policies and algorithms were followed during the patient's care in the ED.  Right upper quadrant abdominal pain, suspicious for biliary process. Labs without leukocytosis or anemia.  No significant electrolyte derangements or renal sufficiency.  Patient has mildly elevated alk phos otherwise no evidence of biliary obstruction or pancreatitis.  Bedside ultrasound did reveal cholelithiasis. No wall thickening or pericholecystic fluid concerning for acute cholecystitis.  Formal ultrasound obtained.  Patient provided with IV fluids and pain medicine.  Patient was redosed for additional pain control.  Formal ultrasound notable for cholelithiasis with a stone in the neck. The CBD was not dilated.  On reassessment patient still complaining of pain and had persistent right upper quadrant tenderness.  Will consult general surgery and discuss possible surgery for persistent symptomatic cholelithiasis.       Final Clinical Impression(s) / ED Diagnoses Final diagnoses:  RUQ pain  Calculus of gallbladder without cholecystitis without obstruction      This chart was dictated using voice recognition software.  Despite best efforts to proofread,  errors can occur which can change the documentation meaning.   Fatima Blank, MD 02/02/20 [phone]

## 2020-02-02 NOTE — ED Triage Notes (Signed)
Patient complaining of upper abdominal pain. Patient states she has a 52 week old and has an implant in her arm. No complaint of any other symptoms.

## 2020-02-02 NOTE — Anesthesia Procedure Notes (Signed)
Procedure Name: Intubation Performed by: Gean Maidens, CRNA Pre-anesthesia Checklist: Patient identified, Emergency Drugs available, Suction available, Patient being monitored and Timeout performed Patient Re-evaluated:Patient Re-evaluated prior to induction Oxygen Delivery Method: Circle system utilized Preoxygenation: Pre-oxygenation with 100% oxygen Induction Type: IV induction Ventilation: Mask ventilation without difficulty Laryngoscope Size: Mac and 3 Grade View: Grade I Tube type: Oral Tube size: 7.0 mm Number of attempts: 1 Airway Equipment and Method: Stylet Placement Confirmation: ETT inserted through vocal cords under direct vision,  positive ETCO2 and breath sounds checked- equal and bilateral Secured at: 21 cm Tube secured with: Tape Dental Injury: Teeth and Oropharynx as per pre-operative assessment

## 2020-02-02 NOTE — Op Note (Signed)
02/02/2020  3:09 PM  PATIENT:  Erika Armstrong, 29 y.o., female, MRN: [id number]  PREOP DIAGNOSIS:  Cholelithisis, Biliary Cholic  POSTOP DIAGNOSIS:   Acute edematous cholecystitis, cholelithiasis  PROCEDURE:   Procedure(s):  LAPAROSCOPIC CHOLECYSTECTOMY WITH INTRAOPERATIVE CHOLANGIOGRAM  SURGEON:   Alphonsa Overall, M.D.  ASSISTANT:   None  ANESTHESIA:   general  Anesthesiologist: Lyn Hollingshead, MD; Barnet Glasgow, MD CRNA: Gean Maidens, CRNA  General  ASA: 2 E  EBL:  minimal  ml  BLOOD ADMINISTERED: none  DRAINS: none   LOCAL MEDICATIONS USED:   30 cc of 1/4% marcine  SPECIMEN:   Gall bladder  COUNTS CORRECT:  YES  INDICATIONS FOR PROCEDURE:  DELPHINA Armstrong is a 29 y.o. (DOB: [birth year]) white female whose primary care physician is Patient, No Pcp Per and comes for cholecystectomy.   The indications and risks of the gall bladder surgery were explained to the patient.  The risks include, but are not limited to, infection, bleeding, common bile duct injury and open surgery.  SURGERY:  The patient was taken to OR room #4 at [facility].  The abdomen was prepped with chloroprep.  The patient was given Rocephin prior to the beginning of the operation.   A time out was held and the surgical checklist run.   An infraumbilical incision was made into the abdominal cavity.  A 12 mm Hasson trocar was inserted into the abdominal cavity through the infraumbilical incision and secured with a 0 Vicryl suture.  Three additional trocars were inserted: a 10 mm trocar in the sub-xiphoid location, a 5 mm trocar in the right mid subcostal area, and a 5 mm trocar in the right lateral subcostal area.   The abdomen was explored and the liver, stomach, and bowel that could be seen were unremarkable.   The gall bladder was edematous with a few omental adhesions to the undersurface of the gall bladder.   I grasped the gall bladder and rotated it cephalad.  Disssection was  carried down to the gall bladder/cystic duct junction and the cystic duct isolated.  A clip was placed on the gall bladder side of the cystic duct.   An intra-operative cholangiogram was shot.   The intra-operative cholangiogram was shot using a cut off Taut catheter placed through a 14 gauge angiocath in the RUQ.  The Taut catheter was inserted in the cut cystic duct and secured with an endoclip.  A cholangiogram was shot with 8 cc of 1/2 strength Isoview.  Using fluoroscopy, the cholangiogram showed the flow of contrast into the common bile duct, up the hepatic radicals, and into the duodenum.  There was no mass or obstruction.  I think that there were some air bubbles in the hepatic branches of the biliary system.  This was a normal intra-operative cholangiogram.   The Taut catheter was removed.  The cystic duct was tripley endoclipped and the cystic artery was identified and clipped.  The gall bladder was bluntly and sharpley dissected from the gall bladder bed.   After the gall bladder was removed from the liver, the gall bladder bed and Triangle of [place] were inspected.  There was no bleeding or bile leak.  The gall bladder was placed in a Ecco Sac bag and delivered through the umbilicus.  The abdomen was irrigated with 1,000 cc saline.   The trocars were then removed.  I infiltrated 30 cc of 1/4% Marcaine into the incisions.  The umbilical port closed with a 0  Vicryl suture and the skin closed with 4-0 Monocryl.  The skin was painted with DermaBond.  The patient's sponge and needle count were correct.  The patient was transported to the RR in good condition.  The plan is to let her go home today.  Alphonsa Overall, MD, [facility] Surgery Pager: [phone] Office phone:  [phone]

## 2020-02-02 NOTE — Discharge Summary (Addendum)
Physician Discharge Summary  Patient ID: Erika Armstrong MRN: [id number] DOB/AGE: [birth year] 29 y.o.  Admit date: 02/02/2020 Discharge date: 02/02/2020  Admission Diagnoses:  Cholelithiasis, cholecystitis  Discharge Diagnoses:  Acute edematous cholecystitis, cholelithiasis  Active Problems:   Biliary colic   PROCEDURES: laparoscopic cholecystectomy with intraoperative cholangiogram, 02/02/20, Dr. Elby Beck Course:      This is a 29 yo white female who is 9 weeks post partum who has a history of ectopic pregnancy, anxiety/depression, and asthma who has been having "indigestion" recently.  She has had it the last couple of nights, but took some medicine for it, although with not much relief.  Last night, this returned after eating pizza, around 2000 and very quickly intensified.  She developed nausea, but no emesis.     Denies diarrhea, fevers, chills, CP, SOB, dysuria.  Nothing made her pain better at home.  Because this continued to progress, she presented to [facility] where she has essentially normal labs, except a mildly elevated ALK phos.  She has an [place] that reveals a gallstone lodged in the neck of the gallbladder but no signs of cholecystitis or choledocholithiasis.  We have been asked to see her for further evaluation.  She was seen in the ED, and admitted for laparoscopic cholecystectomy.    She was seen and taken to the OR by Dr. Lucia Gaskins.  She did well and was  discharged from the PACU.  Follow up is listed below.    CBC Latest Ref Rng & Units 02/02/2020 06/15/2017 01/15/2017  WBC 4.0 - 10.5 K/uL 10.5 10.9(H) 12.0(H)  Hemoglobin 12.0 - 15.0 g/dL 13.9 12.5 12.5  Hematocrit 36 - 46 % 42.3 36.6 35.4(L)  Platelets 150 - 400 K/uL 230 177 225   CMP Latest Ref Rng & Units 02/02/2020 06/15/2017 06/15/2017  Glucose 70 - 99 mg/dL 104(H) 139(H) 112(H)  BUN 6 - 20 mg/dL 11 5(L) 5(L)  Creatinine 0.44 - 1.00 mg/dL 0.84 0.52 0.51  Sodium 135 - 145 mmol/L 137 135 138  Potassium 3.5 - 5.1 mmol/L  3.4(L) 3.6 3.5  Chloride 98 - 111 mmol/L 103 109 108  CO2 22 - 32 mmol/L 24 18(L) 20(L)  Calcium 8.9 - 10.3 mg/dL 9.4 8.5(L) 8.4(L)  Total Protein 6.5 - 8.1 g/dL 7.8 5.9(L) -  Total Bilirubin 0.3 - 1.2 mg/dL 0.5 0.6 -  Alkaline Phos 38 - 126 U/L 171(H) 185(H) -  AST 15 - 41 U/L 18 23 -  ALT 0 - 44 U/L 17 13(L) -     RUQ [place] 02/02/20:  1. Cholelithiasis including a stone at the gallbladder neck. The gallbladder is full but there is no inflammatory findings of acute cholecystitis. 2. 13 mm echogenic right liver mass, nonspecific but classically hemangioma in a young female.    [place] 02/02/20:  Patent biliary system. Injected air bubbles throughout the biliary tree.   Disposition: Discharge disposition: 01-Home or Self Care         Allergies as of 02/02/2020       Reactions   Quetiapine Swelling   MOUTH & THROAT   Seroquel [quetiapine Fumerate] Swelling   Mouth numbness   Prochlorperazine Anxiety   Tape Rash        Medication List     STOP taking these medications    magic mouthwash w/lidocaine Soln   NuvaRing 0.12-0.015 MG/24HR vaginal ring Generic drug: etonogestrel-ethinyl estradiol       TAKE these medications    acetaminophen 500 MG tablet Commonly  known as: TYLENOL You can take 1000 mg every 6 hours as needed for pain.  You can alternate this with ibuprofen.  You can buy this over the counter at any drug store. Do not take more than 4000 mg of Tylenol/acetaminophen per day, it can harm your liver.   albuterol 108 (90 Base) MCG/ACT inhaler Commonly known as: VENTOLIN HFA Inhale 1-2 puffs into the lungs every 6 (six) hours as needed for wheezing or shortness of breath.   ibuprofen 200 MG tablet Commonly known as: ADVIL Your can take 2-3 tablets every 6 hours as needed for pain.  You can alternate this with Tylenol/acetaminophen.  You can buy this over the counter at any drug store. What changed:  medication strength how much to take how to take  this when to take this additional instructions   multivitamin-prenatal 27-0.8 MG Tabs tablet Take 1 tablet by mouth daily at 12 noon.   omeprazole 20 MG capsule Commonly known as: PRILOSEC You can take this as you did before admission to the hospital; as needed. What changed:  how much to take how to take this when to take this additional instructions   oxyCODONE 5 MG immediate release tablet Commonly known as: Oxy IR/ROXICODONE You can take one tablet every 6 hours as needed for pain not relieved by ibuprofen and Tylenol/acetaminophen   sertraline 50 MG tablet Commonly known as: ZOLOFT Take 100 mg by mouth daily.        Follow-up Information     [facility], [facility] Follow up on 02/22/2020.   Specialty: General Surgery Why: Your appointment is at 11:15AM.  Be at the office 30 minutes early for check in.  Bring photo ID and insurance information with you.   Contact information: [address] [place] [address] [phone]                 Signed: Earnstine Regal 02/02/2020, 3:31 PM  Agree with above.  Alphonsa Overall, MD, [facility] Office phone:  [phone]

## 2020-02-03 ENCOUNTER — Encounter (HOSPITAL_COMMUNITY): Payer: Self-pay | Admitting: Surgery

## 2020-02-03 LAB — URINE CULTURE

## 2020-02-04 LAB — SURGICAL PATHOLOGY

## 2020-02-04 NOTE — Anesthesia Postprocedure Evaluation (Signed)
Anesthesia Post Note  Patient: Erika Armstrong  Procedure(s) Performed: LAPAROSCOPIC CHOLECYSTECTOMY WITH INTRAOPERATIVE CHOLANGIOGRAM (N/A Abdomen)     Patient location during evaluation: PACU Anesthesia Type: General Level of consciousness: awake Pain management: pain level controlled Vital Signs Assessment: post-procedure vital signs reviewed and stable Respiratory status: spontaneous breathing Cardiovascular status: stable Postop Assessment: no apparent nausea or vomiting Anesthetic complications: no   No complications documented.  Last Vitals:  Vitals:   02/02/20 1700 02/02/20 1746  BP: 129/83 122/73  Pulse: 72 71  Resp: 15 12  Temp: 36.9 C 36.9 C  SpO2: 99% 99%    Last Pain:  Vitals:   02/02/20 1746  TempSrc:   PainSc: 1    Pain Goal: Patients Stated Pain Goal: 0 (02/02/20 0253)                 Huston Foley

## 2020-02-07 NOTE — Progress Notes (Signed)
Pts husband given number for patient experience after pt expressed concern of inappropriate comments while at [facility] for surgery.

## 2020-02-24 ENCOUNTER — Other Ambulatory Visit: Payer: Self-pay | Admitting: Student

## 2020-02-24 ENCOUNTER — Other Ambulatory Visit (HOSPITAL_COMMUNITY): Payer: Self-pay | Admitting: Student

## 2020-02-24 DIAGNOSIS — R109 Unspecified abdominal pain: Secondary | ICD-10-CM

## 2020-02-28 ENCOUNTER — Encounter (HOSPITAL_COMMUNITY): Payer: Self-pay

## 2020-02-28 ENCOUNTER — Ambulatory Visit (HOSPITAL_COMMUNITY): Payer: PRIVATE HEALTH INSURANCE

## 2020-04-11 ENCOUNTER — Encounter: Payer: Self-pay | Admitting: Gastroenterology

## 2020-04-27 ENCOUNTER — Encounter: Payer: Self-pay | Admitting: Gastroenterology

## 2020-04-27 ENCOUNTER — Other Ambulatory Visit (INDEPENDENT_AMBULATORY_CARE_PROVIDER_SITE_OTHER): Payer: PRIVATE HEALTH INSURANCE

## 2020-04-27 ENCOUNTER — Ambulatory Visit (INDEPENDENT_AMBULATORY_CARE_PROVIDER_SITE_OTHER): Payer: PRIVATE HEALTH INSURANCE | Admitting: Gastroenterology

## 2020-04-27 VITALS — BP 102/80 | HR 72 | Ht 67.5 in | Wt 218.1 lb

## 2020-04-27 DIAGNOSIS — R932 Abnormal findings on diagnostic imaging of liver and biliary tract: Secondary | ICD-10-CM

## 2020-04-27 DIAGNOSIS — R748 Abnormal levels of other serum enzymes: Secondary | ICD-10-CM | POA: Diagnosis not present

## 2020-04-27 DIAGNOSIS — K625 Hemorrhage of anus and rectum: Secondary | ICD-10-CM | POA: Diagnosis not present

## 2020-04-27 DIAGNOSIS — R110 Nausea: Secondary | ICD-10-CM

## 2020-04-27 DIAGNOSIS — R1010 Upper abdominal pain, unspecified: Secondary | ICD-10-CM | POA: Diagnosis not present

## 2020-04-27 LAB — HEPATIC FUNCTION PANEL
ALT: 14 U/L (ref 0–35)
AST: 14 U/L (ref 0–37)
Albumin: 4.2 g/dL (ref 3.5–5.2)
Alkaline Phosphatase: 157 U/L — ABNORMAL HIGH (ref 39–117)
Bilirubin, Direct: 0.2 mg/dL (ref 0.0–0.3)
Total Bilirubin: 0.4 mg/dL (ref 0.2–1.2)
Total Protein: 7.5 g/dL (ref 6.0–8.3)

## 2020-04-27 LAB — H. PYLORI ANTIBODY, IGG: H Pylori IgG: NEGATIVE

## 2020-04-27 LAB — LIPASE: Lipase: 15 U/L (ref 11.0–59.0)

## 2020-04-27 LAB — GAMMA GT: GGT: 18 U/L (ref 7–51)

## 2020-04-27 MED ORDER — OMEPRAZOLE 40 MG PO CPDR: 40.0000 mg | DELAYED_RELEASE_CAPSULE | Freq: Every day | ORAL | 1 refills | Status: DC

## 2020-04-27 MED ORDER — ONDANSETRON 4 MG PO TBDP: 4.0000 mg | ORAL_TABLET | Freq: Four times a day (QID) | ORAL | 1 refills | Status: DC | PRN

## 2020-04-27 NOTE — Patient Instructions (Signed)
If you are age 29 or older, your body mass index should be between 23-30. Your Body mass index is 33.66 kg/m. If this is out of the aforementioned range listed, please consider follow up with your Primary Care Provider.  If you are age 49 or younger, your body mass index should be between 19-25. Your Body mass index is 33.66 kg/m. If this is out of the aformentioned range listed, please consider follow up with your Primary Care Provider.   You have been scheduled for a CT scan of the liver at [facility] ([address]---this is in the same building as [employer]). You are scheduled on Tuesday, 05-09-20 at 3:00pm. You should arrive 15 minutes prior to your appointment time for registration. Please follow the written instructions below on the day of your exam:  WARNING: IF YOU ARE ALLERGIC TO IODINE/X-RAY DYE, PLEASE NOTIFY RADIOLOGY IMMEDIATELY AT [phone]! YOU WILL BE GIVEN A 13 HOUR PREMEDICATION PREP.  1) Do not eat or drink anything after 11:00am (4 hours prior to your test) 2) You have been given 1 bottle of oral contrast to drink. The solution may taste better if refrigerated, but do NOT add ice or any other liquid to this solution. Shake well before drinking.    Drink 1 bottle of contrast @ 2:00pm    You may take any medications as prescribed with a small amount of water, if necessary. If you take any of the following medications: METFORMIN, GLUCOPHAGE, GLUCOVANCE, AVANDAMET, RIOMET, FORTAMET, [place] MET, JANUMET, GLUMETZA or METAGLIP, you MAY be asked to HOLD this medication 48 hours AFTER the exam.  The purpose of you drinking the oral contrast is to aid in the visualization of your intestinal tract. The contrast solution may cause some diarrhea. Depending on your individual set of symptoms, you may also receive an intravenous injection of x-ray contrast/dye. Plan on being at [facility] for 30 minutes or longer, depending on the type of exam you are  having performed.  This test typically takes 30-45 minutes to complete.  If you have any questions regarding your exam or if you need to reschedule, you may call the CT department at [phone] between the hours of 8:00 am and 5:00 pm, Monday-Friday.  ________________________________________________________________________  Please go to the lab in the basement of our building to have lab work done as you leave today. Hit "B" for basement when you get on the elevator.  When the doors open the lab is on your left.  We will call you with the results. Thank you.  Due to recent changes in healthcare laws, you may see the results of your imaging and laboratory studies on MyChart before your provider has had a chance to review them.  We understand that in some cases there may be results that are confusing or concerning to you. Not all laboratory results come back in the same time frame and the provider may be waiting for multiple results in order to interpret others.  Please give [place] 48 hours in order for your provider to thoroughly review all the results before contacting the office for clarification of your results.   We have sent the following medications to your pharmacy for you to pick up at your convenience: Omeprazole 40 mg: Take once daily Zofran 4 mg ODT: Dissolve 1 tablet orally every 6 hours as needed for nausea  Thank you for entrusting me with your care and for choosing [employer], Dr. [place] Cellar

## 2020-04-27 NOTE — Progress Notes (Signed)
HPI :  29 year old female with history of gallstones status post cholecystectomy, headaches, GERD referred by Carlena Hurl PA-C for abdominal pain, GERD, elevated alkaline phosphatase, rectal bleeding, abnormal liver imaging.  The patient states she presented to the hospital on September 22 with severe right upper quadrant pain.  She underwent a ultrasound which showed gallstones including a stone at the gallbladder neck but without evidence of cholecystitis.  She also had a 13 mm echogenic right liver mass which was nonspecific, suspected possible hemangioma.  She underwent cholecystectomy with Dr. Lucia Gaskins, had negative cholangiogram operatively.  She states the acute pain was improved following cholecystectomy although over time her pain has come back and continues to bother her.  She has intermittent right upper quadrant to epigastric pain that is often a bit worse with eating, that appears to be the main trigger.  She has tried eating low-fat diet and avoid heavy foods but it does not make any difference.  She states even a salad will cause her to have pain.  Pain usually last about an hour or 2 after eating and then resolves on its own.  There is no positional component.  She does have some nausea postprandially but does not vomit.  She has been having some heartburn that has been bothering her recently but has not been taking anything for that.  She denies any dysphagia.  She denies any routine NSAID use, only rarely during a headache.  She uses Tylenol mostly to treat headaches.  She otherwise had a one-time episode of bright red blood per rectum on October 14.  She states she had no triggers for that, no constipation or diarrhea.  No abdominal pains with that.  She has not had any recurrence of rectal bleeding.  She denied any perianal pain at that time.  She denies any family history of colon cancer.  No family history of inflammatory bowel disease.  Her mother had diverticulitis.  She is a bit  anxious about the prior ultrasound findings of the small liver lesion and she wants clarification of that.  Of note when she was in the hospital her alkaline phosphatase was 170s with a normal ALT and AST and bilirubin.  It has not been repeated since September.  She denies any history of liver disease otherwise.   Past Medical History:  Diagnosis Date  . Allergy   . Anxiety    and panic attacks  . Anxiety   . Arthritis    wrist, knees - otc med prn  . Asthma    no inhaler - uses nebalizer  . Bronchitis    history - per pt  . Depression   . Endometriosis   . Gallstones   . GERD (gastroesophageal reflux disease)   . Headache(784.0)    otc med prn  . Ovarian cyst   . PAC (premature atrial contraction)   . PVC (premature ventricular contraction)   . Seasonal allergies   . Thyroid disease    Hypothyroid - pt states no med - saw 2nd opinion - neg      Past Surgical History:  Procedure Laterality Date  . CHOLECYSTECTOMY N/A 02/02/2020   Procedure: LAPAROSCOPIC CHOLECYSTECTOMY WITH INTRAOPERATIVE CHOLANGIOGRAM;  Surgeon: Alphonsa Overall, MD;  Location: [facility];  Service: General;  Laterality: N/A;  . DERMOID CYST  EXCISION     right side  . DIAGNOSTIC LAPAROSCOPY    . DILATION AND CURETTAGE OF UTERUS  10/25/2013   Procedure: DILATATION AND CURETTAGE;  Surgeon: Satira Anis  Sabra Heck, MD;  Location: [facility];  Service: Gynecology;;  . I & D EXTREMITY Right 04/06/2013   Procedure: REMOVAL OF IMPLANT RIGHT ARM--IMPLANT DISCARDED;  Surgeon: Cammie Sickle., MD;  Location: [facility];  Service: Orthopedics;  Laterality: Right;  . LAPAROSCOPY Left 10/08/2012   Procedure: LAPAROSCOPY OPERATIVE;REMOVAL OF [place] AND  REPLACEMENT OF [place];  Surgeon: Terrance Mass, MD;  Location: [facility];  Service: Gynecology;  Laterality: Left;  left upper arm and right upper arm  . LAPAROSCOPY N/A 10/25/2013   Procedure: LAPAROSCOPY DIAGNOSTIC WITH REMOVAL OF RIGHT PARATUBAL CYST,  CHROMOPERTUBATION, RIGHT OVARIAN CYST DRAINAGE, left hydrosalpinx,lysis of adhesions;  Surgeon: Lyman Speller, MD;  Location: [facility];  Service: Gynecology;  Laterality: N/A;  . nexplanon implant     stated 04/27/2020  . nexplanon removed     Family History  Problem Relation Age of Onset  . Heart disease Mother   . Hyperlipidemia Father   . Diabetes Father   . COPD Maternal Grandmother   . Lung disease Maternal Grandfather        agent orange  . Hypertension Paternal Grandmother   . Diabetes Paternal Grandmother   . Irritable bowel syndrome Paternal Grandmother   . Stroke Paternal Grandmother   . COPD Paternal Grandfather   . Bone cancer Sister        mets   Social History   Tobacco Use  . Smoking status: Never Smoker  . Smokeless tobacco: Never Used  Vaping Use  . Vaping Use: Never used  Substance Use Topics  . Alcohol use: No    Comment: social -- 3-4 times per year   . Drug use: No   Current Outpatient Medications  Medication Sig Dispense Refill  . acetaminophen (TYLENOL) 500 MG tablet You can take 1000 mg every 6 hours as needed for pain.  You can alternate this with ibuprofen.  You can buy this over the counter at any drug store. Do not take more than 4000 mg of Tylenol/acetaminophen per day, it can harm your liver. 30 tablet 0  . albuterol (PROVENTIL HFA;VENTOLIN HFA) 108 (90 Base) MCG/ACT inhaler Inhale 1-2 puffs into the lungs every 6 (six) hours as needed for wheezing or shortness of breath.    . busPIRone (BUSPAR) 5 MG tablet Take 5 mg by mouth 2 (two) times daily.    Marland Kitchen ibuprofen (ADVIL) 200 MG tablet Your can take 2-3 tablets every 6 hours as needed for pain.  You can alternate this with Tylenol/acetaminophen.  You can buy this over the counter at any drug store.    . Prenatal Vit-Fe Fumarate-FA (MULTIVITAMIN-PRENATAL) 27-0.8 MG TABS tablet Take 1 tablet by mouth daily at 12 noon.    . sertraline (ZOLOFT) 100 MG tablet Take 100 mg by mouth daily.     No  current facility-administered medications for this visit.   Allergies  Allergen Reactions  . Seroquel [Quetiapine Fumerate] Swelling    Mouth numbness  . Seroquel [Quetiapine] Swelling    MOUTH & THROAT  . Compazine [Prochlorperazine] Anxiety  . Tape Rash     Review of Systems: All systems reviewed and negative except where noted in HPI.   Lab Results  Component Value Date   WBC 10.5 02/02/2020   HGB 13.9 02/02/2020   HCT 42.3 02/02/2020   MCV 90.6 02/02/2020   PLT 230 02/02/2020    Lab Results  Component Value Date   CREATININE 0.84 02/02/2020   BUN 11 02/02/2020  NA 137 02/02/2020   K 3.4 (L) 02/02/2020   CL 103 02/02/2020   CO2 24 02/02/2020    Physical Exam: BP 102/80 (BP Location: Left Arm, Patient Position: Sitting, Cuff Size: Normal)   Pulse 72   Ht 5' 7.5" (1.715 m) Comment: height measured without shoes  Wt 218 lb 2 oz (98.9 kg)   Breastfeeding Yes   BMI 33.66 kg/m  Constitutional: Pleasant,well-developed, female in no acute distress. HEENT: Normocephalic and atraumatic. Conjunctivae are normal. No scleral icterus. Neck supple.  Cardiovascular: Normal rate, regular rhythm.  Pulmonary/chest: Effort normal and breath sounds normal. No wheezing, rales or rhonchi. Abdominal: Soft, nondistended, mild tenderness in RUQ to epigastric area.  There are no masses palpable.  DRE / Anoscopy - Tia Alert CMA as standby - no fissure, no mass lesions, anoscopy shows small internal hemorrhoids Extremities: no edema Lymphadenopathy: No cervical adenopathy noted. Neurological: Alert and oriented to person place and time. Skin: Skin is warm and dry. No rashes noted. Psychiatric: Normal mood and affect. Behavior is normal.   ASSESSMENT AND PLAN: 29 year old female here for new patient assessment of the following:  Abdominal pain / nausea - had acute abrupt onset right upper quadrant pain leading to ED visit in September which subsequently led to acute cholecystectomy.   This relieved her acute pain however she has had recurrent intermittent postprandial right upper quadrant to epigastric pain associated with nausea.  Discussed differential diagnosis with her how she wanted to proceed.  PUD, H. pylori gastritis, dyspepsia, retained stones etc.  We discussed and offered her an endoscopy today however she wants to start with conservative measures initially which I think is reasonable.  I will place her on a trial of omeprazole 40 mg a day for the next few weeks given her underlying reflux and see if that helps her pain.  I advised her to continue to avoid NSAIDs.  I will screen her for H. pylori with serology testing.  I will also repeat her LFTs and lipase to make sure normal.  I will also give her some Zofran to use as needed in interim as well.  Hopefully this provides some benefit.  If not her symptoms persist asked her to call me in a few weeks and we may consider an EGD.  She agreed with the plan.  Elevated alkaline phosphatase level - elevated previously in the setting of having cholecystectomy, will repeat LFTs along with GGT and trend this.  If it remains elevated and positive GGT will need further serologic imaging.  Rectal bleeding - one-time episode which was painless.  No recurrence since then.  Anoscopy performed today which shows evidence of internal hemorrhoids which is the most likely cause.  No evidence of fissure, DRE otherwise normal.  I asked her to keep an eye on this and observe for recurrent symptoms.  If she has recurrent symptoms we would need to consider colonoscopy or flex sig, she agreed  Abnormal liver imaging - small lesion in the liver noted on ultrasound which is more than likely a hemangioma however ultrasound not definitive.  Discussed options to image this with either CT or MRI.  We will proceed with CT scan to further evaluate this and provide reassurance that this is a benign entity.  We will also make sure there is no other cause for her  abdominal pain on this imaging.  She agreed with the plan, further recommendations pending results.  All questions answered  Pollard Cellar, MD [facility]  CC:  Carlena Hurl, PA-C

## 2020-05-09 ENCOUNTER — Other Ambulatory Visit: Payer: Self-pay

## 2020-05-09 ENCOUNTER — Ambulatory Visit (INDEPENDENT_AMBULATORY_CARE_PROVIDER_SITE_OTHER)
Admission: RE | Admit: 2020-05-09 | Discharge: 2020-05-09 | Disposition: A | Discharge status: Home / Self Care | Payer: PRIVATE HEALTH INSURANCE | Source: Ambulatory Visit | Attending: Gastroenterology | Admitting: Gastroenterology

## 2020-05-09 DIAGNOSIS — K625 Hemorrhage of anus and rectum: Secondary | ICD-10-CM

## 2020-05-09 DIAGNOSIS — R932 Abnormal findings on diagnostic imaging of liver and biliary tract: Secondary | ICD-10-CM

## 2020-05-09 DIAGNOSIS — R1010 Upper abdominal pain, unspecified: Secondary | ICD-10-CM

## 2020-05-09 DIAGNOSIS — R748 Abnormal levels of other serum enzymes: Secondary | ICD-10-CM

## 2020-05-09 DIAGNOSIS — R110 Nausea: Secondary | ICD-10-CM

## 2020-05-09 MED ORDER — IOHEXOL 300 MG/ML  SOLN
100.0000 mL | Freq: Once | INTRAMUSCULAR | Status: AC | PRN
  Administered 2020-05-09: 15:00:00 100 mL via INTRAVENOUS

## 2020-05-10 ENCOUNTER — Telehealth: Payer: Self-pay | Admitting: Gastroenterology

## 2020-05-10 NOTE — Telephone Encounter (Signed)
Spoke with patient in regards to her CT scan results and recommendations per Dr. Micah Flesher. Pt states that the Omeprazole 40 mg daily has not been helping, pt states that her reflux seems to be worse in the mornings, pt states that she thinks the oral contrast may have exacerbated her symptoms and caused some mild nausea. Pt states that she has not taken the Zofran. Pt denies any acidic, spicy, fried, or greasy foods. Pt states that she has minimal coffee and chocolate. Pt states that she does not eat within 3 hours of going to bed. Patient also expresses concerns about "Mild splenomegaly" that was noted on CT scan report and also wanting to discuss if she needs to have the MRCP done. Patient has been scheduled for a follow up on Wednesday, 07/05/2020 at 9:20 AM. Please advise, thank you

## 2020-05-10 NOTE — Telephone Encounter (Signed)
Okay thanks [employer]. If her symptoms have been persistent and she is anxious about the CT findings, then okay to proceed with MRCP to ensure okay, if you can coordinate that. She can try increasing omeprazole to BID for 2 weeks in the interim to see if that helps. If symptoms persist and no clear answers on MRCP then we may need to proceed with EGD. Thanks

## 2020-05-10 NOTE — Telephone Encounter (Signed)
Patient calling for CT results. 

## 2020-05-11 ENCOUNTER — Other Ambulatory Visit: Payer: Self-pay

## 2020-05-11 DIAGNOSIS — R1010 Upper abdominal pain, unspecified: Secondary | ICD-10-CM

## 2020-05-11 DIAGNOSIS — K769 Liver disease, unspecified: Secondary | ICD-10-CM

## 2020-05-11 DIAGNOSIS — R932 Abnormal findings on diagnostic imaging of liver and biliary tract: Secondary | ICD-10-CM

## 2020-05-11 NOTE — Telephone Encounter (Signed)
Lm on vm for patient to return call.   Patient has been scheduled for an MRI/MRCP at [facility] on Friday, 05/19/2020 at 7 am, arrival time is 6:30 AM. NPO after midnight.

## 2020-05-11 NOTE — Telephone Encounter (Signed)
Spoke with patient in regards to Dr. Lanetta Inch recommendations, pt is aware of MRI/MRCP appointment. She is aware that she needs to be NPO after midnight. Pt states that she is not sure if this is related but she has been extremely fatigued lately and having hair loss. Advised patient that it is hard to tell as several things can cause fatigue. Patient verbalized understanding and had no concerns at the end of the call.

## 2020-05-11 NOTE — Telephone Encounter (Signed)
Pt is requesting a call back from a nurse (missed call) 

## 2020-05-19 ENCOUNTER — Other Ambulatory Visit: Payer: Self-pay | Admitting: Gastroenterology

## 2020-05-19 ENCOUNTER — Other Ambulatory Visit: Payer: Self-pay

## 2020-05-19 ENCOUNTER — Ambulatory Visit (HOSPITAL_COMMUNITY)
Admission: RE | Admit: 2020-05-19 | Discharge: 2020-05-19 | Disposition: A | Discharge status: Home / Self Care | Payer: No Typology Code available for payment source | Source: Ambulatory Visit | Attending: Gastroenterology | Admitting: Gastroenterology

## 2020-05-19 DIAGNOSIS — K769 Liver disease, unspecified: Secondary | ICD-10-CM

## 2020-05-19 DIAGNOSIS — R1010 Upper abdominal pain, unspecified: Secondary | ICD-10-CM | POA: Diagnosis present

## 2020-05-19 DIAGNOSIS — R932 Abnormal findings on diagnostic imaging of liver and biliary tract: Secondary | ICD-10-CM

## 2020-05-19 MED ORDER — GADOBUTROL 1 MMOL/ML IV SOLN
10.0000 mL | Freq: Once | INTRAVENOUS | Status: AC | PRN
  Administered 2020-05-19: 10 mL via INTRAVENOUS

## 2020-07-05 ENCOUNTER — Ambulatory Visit: Payer: PRIVATE HEALTH INSURANCE | Admitting: Gastroenterology

## 2020-07-05 NOTE — Progress Notes (Deleted)
HPI :  30 year old female with history of gallstones status post cholecystectomy, headaches, GERD referred by Hedda Slade PA-C for abdominal pain, GERD, elevated alkaline phosphatase, rectal bleeding, abnormal liver imaging.  The patient states she presented to the hospital on September 22 with severe right upper quadrant pain.  She underwent a ultrasound which showed gallstones including a stone at the gallbladder neck but without evidence of cholecystitis.  She also had a 13 mm echogenic right liver mass which was nonspecific, suspected possible hemangioma.  She underwent cholecystectomy with Dr. Ezzard Standing, had negative cholangiogram operatively.  She states the acute pain was improved following cholecystectomy although over time her pain has come back and continues to bother her.  She has intermittent right upper quadrant to epigastric pain that is often a bit worse with eating, that appears to be the main trigger.  She has tried eating low-fat diet and avoid heavy foods but it does not make any difference.  She states even a salad will cause her to have pain.  Pain usually last about an hour or 2 after eating and then resolves on its own.  There is no positional component.  She does have some nausea postprandially but does not vomit.  She has been having some heartburn that has been bothering her recently but has not been taking anything for that.  She denies any dysphagia.  She denies any routine NSAID use, only rarely during a headache.  She uses Tylenol mostly to treat headaches.  She otherwise had a one-time episode of bright red blood per rectum on October 14.  She states she had no triggers for that, no constipation or diarrhea.  No abdominal pains with that.  She has not had any recurrence of rectal bleeding.  She denied any perianal pain at that time.  She denies any family history of colon cancer.  No family history of inflammatory bowel disease.  Her mother had diverticulitis.  She is a bit  anxious about the prior ultrasound findings of the small liver lesion and she wants clarification of that.  Of note when she was in the hospital her alkaline phosphatase was 170s with a normal ALT and AST and bilirubin.  It has not been repeated since September.  She denies any history of liver disease otherwise.   31 year old female here for new patient assessment of the following:  Abdominal pain / nausea - had acute abrupt onset right upper quadrant pain leading to ED visit in September which subsequently led to acute cholecystectomy.  This relieved her acute pain however she has had recurrent intermittent postprandial right upper quadrant to epigastric pain associated with nausea.  Discussed differential diagnosis with her how she wanted to proceed.  PUD, H. pylori gastritis, dyspepsia, retained stones etc.  We discussed and offered her an endoscopy today however she wants to start with conservative measures initially which I think is reasonable.  I will place her on a trial of omeprazole 40 mg a day for the next few weeks given her underlying reflux and see if that helps her pain.  I advised her to continue to avoid NSAIDs.  I will screen her for H. pylori with serology testing.  I will also repeat her LFTs and lipase to make sure normal.  I will also give her some Zofran to use as needed in interim as well.  Hopefully this provides some benefit.  If not her symptoms persist asked her to call me in a few weeks and we may  consider an EGD.  She agreed with the plan.  Elevated alkaline phosphatase level - elevated previously in the setting of having cholecystectomy, will repeat LFTs along with GGT and trend this.  If it remains elevated and positive GGT will need further serologic imaging.  Rectal bleeding - one-time episode which was painless.  No recurrence since then.  Anoscopy performed today which shows evidence of internal hemorrhoids which is the most likely cause.  No evidence of fissure, DRE  otherwise normal.  I asked her to keep an eye on this and observe for recurrent symptoms.  If she has recurrent symptoms we would need to consider colonoscopy or flex sig, she agreed  Abnormal liver imaging - small lesion in the liver noted on ultrasound which is more than likely a hemangioma however ultrasound not definitive.  Discussed options to image this with either CT or MRI.  We will proceed with CT scan to further evaluate this and provide reassurance that this is a benign entity.  We will also make sure there is no other cause for her abdominal pain on this imaging.  She agreed with the plan, further recommendations pending results.  All questions answered    Tried omeprazole 40mg  / day No NSAIDs H pylori IgG negative Lipase negative LFTs normal other than elevated AP but GGT normal   CT scan 05/09/20 - IMPRESSION: 1. Tiny hypodensity in the RIGHT hepatic lobe posteriorly measuring 11 mm (image 26 of series 9) likely corresponding to the echogenic focus on the previous ultrasound. There is a suggestion of but not definitive evidence of peripheral and nodular enhancement. Ultrasound appearance would be highly suggestive of hemangioma and vast majority of small lesions with this appearance are benign in the absence of risk factors such as malignancy or known liver disease. 2. Post cholecystectomy with mild intrahepatic biliary duct distension, some of this present since pre cholecystectomy imaging. Correlate with any clinical or laboratory evidence of cholangitis as biliary radicles are seen beyond secondary branches with mild dilation that is slightly more pronounced that pre cholecystectomy imaging. This finding can be seen in but is not specific for (particularly given that this is observed on CT) primary sclerosing cholangitis. MRCP may be helpful as warranted for further assessment. 3. Mild splenomegaly.   MRCP 05/19/20 - IMPRESSION: 1. Two small hemangiomas in the  posterior right liver. No suspicious liver lesions. 2. Bile ducts are within normal post cholecystectomy limits. CBD diameter 4 mm. No evidence of choledocholithiasis or other biliary Complication.      Past Medical History:  Diagnosis Date  . Allergy   . Anxiety    and panic attacks  . Anxiety   . Arthritis    wrist, knees - otc med prn  . Asthma    no inhaler - uses nebalizer  . Bronchitis    history - per pt  . Depression   . Endometriosis   . Gallstones   . GERD (gastroesophageal reflux disease)   . Headache(784.0)    otc med prn  . Ovarian cyst   . PAC (premature atrial contraction)   . PVC (premature ventricular contraction)   . Seasonal allergies   . Thyroid disease    Hypothyroid - pt states no med - saw 2nd opinion - neg      Past Surgical History:  Procedure Laterality Date  . CHOLECYSTECTOMY N/A 02/02/2020   Procedure: LAPAROSCOPIC CHOLECYSTECTOMY WITH INTRAOPERATIVE CHOLANGIOGRAM;  Surgeon: Ovidio Kin, MD;  Location: [facility];  Service: General;  Laterality:  N/A;  . DERMOID CYST  EXCISION     right side  . DIAGNOSTIC LAPAROSCOPY    . DILATION AND CURETTAGE OF UTERUS  10/25/2013   Procedure: DILATATION AND CURETTAGE;  Surgeon: Annamaria Boots, MD;  Location: [facility];  Service: Gynecology;;  . I & D EXTREMITY Right 04/06/2013   Procedure: REMOVAL OF IMPLANT RIGHT ARM--IMPLANT DISCARDED;  Surgeon: Wyn Forster., MD;  Location: [facility];  Service: Orthopedics;  Laterality: Right;  . LAPAROSCOPY Left 10/08/2012   Procedure: LAPAROSCOPY OPERATIVE;REMOVAL OF NEXPLANON AND  REPLACEMENT OF NEXPLANON DEVICE;  Surgeon: Ok Edwards, MD;  Location: [facility];  Service: Gynecology;  Laterality: Left;  left upper arm and right upper arm  . LAPAROSCOPY N/A 10/25/2013   Procedure: LAPAROSCOPY DIAGNOSTIC WITH REMOVAL OF RIGHT PARATUBAL CYST, CHROMOPERTUBATION, RIGHT OVARIAN CYST DRAINAGE, left hydrosalpinx,lysis of adhesions;  Surgeon: Annamaria Boots, MD;  Location: [facility];  Service: Gynecology;  Laterality: N/A;  . nexplanon implant     stated 04/27/2020  . nexplanon removed     Family History  Problem Relation Age of Onset  . Heart disease Mother   . Hyperlipidemia Father   . Diabetes Father   . COPD Maternal Grandmother   . Lung disease Maternal Grandfather        agent orange  . Hypertension Paternal Grandmother   . Diabetes Paternal Grandmother   . Irritable bowel syndrome Paternal Grandmother   . Stroke Paternal Grandmother   . COPD Paternal Grandfather   . Bone cancer Sister        mets   Social History   Tobacco Use  . Smoking status: Never Smoker  . Smokeless tobacco: Never Used  Vaping Use  . Vaping Use: Never used  Substance Use Topics  . Alcohol use: No    Comment: social -- 3-4 times per year   . Drug use: No   Current Outpatient Medications  Medication Sig Dispense Refill  . acetaminophen (TYLENOL) 500 MG tablet You can take 1000 mg every 6 hours as needed for pain.  You can alternate this with ibuprofen.  You can buy this over the counter at any drug store. Do not take more than 4000 mg of Tylenol/acetaminophen per day, it can harm your liver. 30 tablet 0  . albuterol (PROVENTIL HFA;VENTOLIN HFA) 108 (90 Base) MCG/ACT inhaler Inhale 1-2 puffs into the lungs every 6 (six) hours as needed for wheezing or shortness of breath.    . busPIRone (BUSPAR) 5 MG tablet Take 5 mg by mouth 2 (two) times daily.    Marland Kitchen ibuprofen (ADVIL) 200 MG tablet Your can take 2-3 tablets every 6 hours as needed for pain.  You can alternate this with Tylenol/acetaminophen.  You can buy this over the counter at any drug store.    Marland Kitchen omeprazole (PRILOSEC) 40 MG capsule Take 1 capsule (40 mg total) by mouth daily. 90 capsule 1  . ondansetron (ZOFRAN ODT) 4 MG disintegrating tablet Take 1 tablet (4 mg total) by mouth every 6 (six) hours as needed for nausea or vomiting. 30 tablet 1  . Prenatal Vit-Fe Fumarate-FA  (MULTIVITAMIN-PRENATAL) 27-0.8 MG TABS tablet Take 1 tablet by mouth daily at 12 noon.    . sertraline (ZOLOFT) 100 MG tablet Take 100 mg by mouth daily.     No current facility-administered medications for this visit.   Allergies  Allergen Reactions  . Seroquel [Quetiapine Fumerate] Swelling    Mouth numbness  . Seroquel [  Quetiapine] Swelling    MOUTH & THROAT  . Compazine [Prochlorperazine] Anxiety  . Tape Rash     Review of Systems: All systems reviewed and negative except where noted in HPI.    No results found.  Physical Exam: There were no vitals taken for this visit. Constitutional: Pleasant,well-developed, ***female in no acute distress. HEENT: Normocephalic and atraumatic. Conjunctivae are normal. No scleral icterus. Neck supple.  Cardiovascular: Normal rate, regular rhythm.  Pulmonary/chest: Effort normal and breath sounds normal. No wheezing, rales or rhonchi. Abdominal: Soft, nondistended, nontender. Bowel sounds active throughout. There are no masses palpable. No hepatomegaly. Extremities: no edema Lymphadenopathy: No cervical adenopathy noted. Neurological: Alert and oriented to person place and time. Skin: Skin is warm and dry. No rashes noted. Psychiatric: Normal mood and affect. Behavior is normal.   ASSESSMENT AND PLAN:  Trisha Mangle, *

## 2021-05-22 ENCOUNTER — Ambulatory Visit (INDEPENDENT_AMBULATORY_CARE_PROVIDER_SITE_OTHER): Payer: No Typology Code available for payment source | Admitting: Physician Assistant

## 2021-05-22 ENCOUNTER — Other Ambulatory Visit (INDEPENDENT_AMBULATORY_CARE_PROVIDER_SITE_OTHER): Payer: No Typology Code available for payment source

## 2021-05-22 ENCOUNTER — Encounter: Payer: Self-pay | Admitting: Physician Assistant

## 2021-05-22 VITALS — BP 114/74 | HR 84 | Ht 67.5 in | Wt 230.4 lb

## 2021-05-22 DIAGNOSIS — R109 Unspecified abdominal pain: Secondary | ICD-10-CM

## 2021-05-22 DIAGNOSIS — K625 Hemorrhage of anus and rectum: Secondary | ICD-10-CM

## 2021-05-22 DIAGNOSIS — R1013 Epigastric pain: Secondary | ICD-10-CM | POA: Diagnosis not present

## 2021-05-22 LAB — CBC WITH DIFFERENTIAL/PLATELET
Basophils Absolute: 0.1 10*3/uL (ref 0.0–0.1)
Basophils Relative: 0.6 % (ref 0.0–3.0)
Eosinophils Absolute: 0.2 10*3/uL (ref 0.0–0.7)
Eosinophils Relative: 3.1 % (ref 0.0–5.0)
HCT: 42.8 % (ref 36.0–46.0)
Hemoglobin: 14.5 g/dL (ref 12.0–15.0)
Lymphocytes Relative: 31.6 % (ref 12.0–46.0)
Lymphs Abs: 2.5 10*3/uL (ref 0.7–4.0)
MCHC: 33.8 g/dL (ref 30.0–36.0)
MCV: 87.7 fl (ref 78.0–100.0)
Monocytes Absolute: 0.5 10*3/uL (ref 0.1–1.0)
Monocytes Relative: 6.8 % (ref 3.0–12.0)
Neutro Abs: 4.5 10*3/uL (ref 1.4–7.7)
Neutrophils Relative %: 57.9 % (ref 43.0–77.0)
Platelets: 210 10*3/uL (ref 150.0–400.0)
RBC: 4.87 Mil/uL (ref 3.87–5.11)
RDW: 13.4 % (ref 11.5–15.5)
WBC: 7.8 10*3/uL (ref 4.0–10.5)

## 2021-05-22 LAB — COMPREHENSIVE METABOLIC PANEL
ALT: 9 U/L (ref 0–35)
AST: 14 U/L (ref 0–37)
Albumin: 4.2 g/dL (ref 3.5–5.2)
Alkaline Phosphatase: 159 U/L — ABNORMAL HIGH (ref 39–117)
BUN: 12 mg/dL (ref 6–23)
CO2: 27 mEq/L (ref 19–32)
Calcium: 9.3 mg/dL (ref 8.4–10.5)
Chloride: 105 mEq/L (ref 96–112)
Creatinine, Ser: 0.71 mg/dL (ref 0.40–1.20)
GFR: 114.03 mL/min (ref >60.00)
Glucose, Bld: 71 mg/dL (ref 70–99)
Potassium: 4 mEq/L (ref 3.5–5.1)
Sodium: 138 mEq/L (ref 135–145)
Total Bilirubin: 0.5 mg/dL (ref 0.2–1.2)
Total Protein: 7.2 g/dL (ref 6.0–8.3)

## 2021-05-22 LAB — SEDIMENTATION RATE: Sed Rate: 19 mm/hr (ref 0–20)

## 2021-05-22 LAB — C-REACTIVE PROTEIN: CRP: 1.4 mg/dL (ref 0.5–20.0)

## 2021-05-22 MED ORDER — NA SULFATE-K SULFATE-MG SULF 17.5-3.13-1.6 GM/177ML PO SOLN: 1.0000 | Freq: Once | ORAL | 0 refills | Status: AC

## 2021-05-22 NOTE — Progress Notes (Signed)
Subjective:    Patient ID: Erika Armstrong, female    DOB: [birth year], 31 y.o.   MRN: [id number]  HPI Erika Armstrong is a 31 year old white female, established with Dr. [name].  She was last seen here in December 2021, and comes in today with complaints of abdominal pain and an episode of rectal bleeding. After visit in 2021 at which time she had an elevated alkaline phosphatase she underwent further work-up with a GGT which was normal.  She had CT in December 2021 which showed a 7 mm hypodensity in the right hepatic lobe present since 2015 and most consistent with a hemangioma, there was mild splenomegaly, mild ductal dilation postcholecystectomy.  Because of complaints of abdominal discomfort she underwent subsequent MRI and MRCP which did not show any evidence of steatosis, the 2 very small liver lesions largest 1.1 cm were both consistent with hemangiomas and bile ducts were normal postcholecystectomy with CBD of 4 mm. She had had an episode of rectal bleeding in 2021 as well, she had a anoscopy per Dr. [name] which did confirm internal hemorrhoids, no fissure and there was discussion about potential colonoscopy should she have further issues.  Day patient says that she had 1 episode of rectal bleeding 2 to 3 weeks ago which occurred with a bowel movement and says there was quite a bit of red blood in the commode and some small clots.  This was not associated with straining or hard stools.  No associated rectal pain or abdominal pain.  The following day stools were back to normal and she has been having regular bowel movement since with no further obvious bleeding. She is status post prior cholecystectomy as outlined above.  About 3 weeks ago she had an episode of abdominal pain located in the epigastrium and right upper quadrant which she describes as pain and a swollen feeling in that portion of her abdomen which lasted fairly constantly for about a week and a half she describes it as a pressure  type of feeling.  This has subsided some over the past week.  She has not noticed any change in her symptoms with p.o. intake no associated nausea or vomiting no fever or chills. No regular aspirin or NSAIDs, no new medications antibiotics etc.  She had been given a PPI in the past but says she did not notice any improvement in symptoms with that.  Review of Systems Pertinent positive and negative review of systems were noted in the above HPI section.  All other review of systems was otherwise negative.   Outpatient Encounter Medications as of 05/22/2021  Medication Sig   acetaminophen (TYLENOL) 500 MG tablet You can take 1000 mg every 6 hours as needed for pain.  You can alternate this with ibuprofen.  You can buy this over the counter at any drug store. Do not take more than 4000 mg of Tylenol/acetaminophen per day, it can harm your liver.   Na Sulfate-K Sulfate-Mg Sulf 17.5-3.13-1.6 GM/177ML SOLN Take 1 kit by mouth once for 1 dose.   sertraline (ZOLOFT) 100 MG tablet Take 100 mg by mouth daily.   albuterol (PROVENTIL HFA;VENTOLIN HFA) 108 (90 Base) MCG/ACT inhaler Inhale 1-2 puffs into the lungs every 6 (six) hours as needed for wheezing or shortness of breath. (Patient not taking: Reported on 05/22/2021)   [DISCONTINUED] busPIRone (BUSPAR) 5 MG tablet Take 5 mg by mouth 2 (two) times daily.   [DISCONTINUED] ibuprofen (ADVIL) 200 MG tablet Your can take 2-3 tablets every 6 hours  as needed for pain.  You can alternate this with Tylenol/acetaminophen.  You can buy this over the counter at any drug store.   [DISCONTINUED] omeprazole (PRILOSEC) 40 MG capsule Take 1 capsule (40 mg total) by mouth daily.   [DISCONTINUED] ondansetron (ZOFRAN ODT) 4 MG disintegrating tablet Take 1 tablet (4 mg total) by mouth every 6 (six) hours as needed for nausea or vomiting.   [DISCONTINUED] Prenatal Vit-Fe Fumarate-FA (MULTIVITAMIN-PRENATAL) 27-0.8 MG TABS tablet Take 1 tablet by mouth daily at 12 noon.   No  facility-administered encounter medications on file as of 05/22/2021.   Allergies  Allergen Reactions   Seroquel [Quetiapine Fumerate] Swelling    Mouth numbness   Seroquel [Quetiapine] Swelling    MOUTH & THROAT   Compazine [Prochlorperazine] Anxiety   Tape Rash   Patient Active Problem List   Diagnosis Date Noted   Biliary colic 23/53/6144   History of ectopic pregnancy 10/28/2013   Miscarriage 10/21/2013   Asthma, chronic 04/26/2013   Endometriosis 10/20/2012   Ovarian cyst 02/15/2011   Social History   Socioeconomic History   Marital status: Married    Spouse name: Not on file   Number of children: 3   Years of education: Not on file   Highest education level: Not on file  Occupational History   Occupation: RN  Tobacco Use   Smoking status: Never   Smokeless tobacco: Never  Vaping Use   Vaping Use: Never used  Substance and Sexual Activity   Alcohol use: No    Comment: social -- 3-4 times per year    Drug use: No   Sexual activity: Yes    Birth control/protection: None  Other Topics Concern   Not on file  Social History Narrative   Not on file   Social Determinants of Health   Financial Resource Strain: Not on file  Food Insecurity: Not on file  Transportation Needs: Not on file  Physical Activity: Not on file  Stress: Not on file  Social Connections: Not on file  Intimate Partner Violence: Not on file    Erika Armstrong's family history includes Bone cancer in her sister; COPD in her maternal grandmother and paternal grandfather; Diabetes in her father and paternal grandmother; Heart disease in her mother; Hyperlipidemia in her father; Hypertension in her paternal grandmother; Irritable bowel syndrome in her paternal grandmother; Lung disease in her maternal grandfather; Stroke in her paternal grandmother.      Objective:    Vitals:   05/22/21 1050  BP: 114/74  Pulse: 84    Physical Exam Well-developed well-nourished young white female in no acute  distress.  Height, Weight, 230 BMI 35.5  HEENT; nontraumatic normocephalic, EOMI, PE R LA, sclera anicteric. Oropharynx; not examined today Neck; supple, no JVD Cardiovascular; regular rate and rhythm with S1-S2, no murmur rub or gallop Pulmonary; Clear bilaterally Abdomen; soft, she is tender in the right upper quadrant and right mid quadrant no guarding, nondistended, no palpable mass or hepatosplenomegaly, bowel sounds are active Rectal; not done today Skin; benign exam, no jaundice rash or appreciable lesions Extremities; no clubbing cyanosis or edema skin warm and dry Neuro/Psych; alert and oriented x4, grossly nonfocal mood and affect appropriate        Assessment & Plan:   #8 31 year old white female status post prior cholecystectomy with recent episode of epigastric and right upper quadrant pain lasting for about a week and a half, described as a pressure type feeling.  Symptoms have for the most part  improved over the past week, no associated nausea vomiting, no fever or chills, no change in bowel habits and no change with p.o. intake. Patient has definite tenderness in the right upper quadrant and right mid quadrant on exam today.  Etiology is not clear but concerned about intra-abdominal inflammatory process  #2 rectal bleeding-1 episode about 2 to 3 weeks ago with quite a bit of blood noted in the commode.  This was an isolated episode and has not recurred, this was not associated with straining or constipation. She has had previous episodes of rectal bleeding, in 2021 here she underwent anoscopy which did confirm internal hemorrhoids. This most recent episode of bleeding may be secondary to internal hemorrhoids, however with significant amount of blood noted per patient feel important to rule out other colonic pathology.  With her also having recent episode of abdominal pain and persistent right-sided abdominal discomfort and tenderness rule out inflammatory bowel disease  #3  previously confirmed small hepatic hemangiomas on MRI  #4 history of endometriosis 5.  Asthma  Plan; CBC with differential, sed rate, CRP, c-Met Patient will be scheduled for EGD and colonoscopy with Dr. [name].  Both procedures were discussed in detail with patient including indications risk and benefits and she is agreeable to proceed. If symptoms are persisting and endoscopic evaluation is unrevealing she will need repeat abdominal imaging.  In setting of prior diagnosis of endometriosis this also needs to be considered.  Matan Steen S Dimonique Bourdeau PA-C 05/22/2021   Cc: Joya Gaskins, *

## 2021-05-22 NOTE — Patient Instructions (Signed)
If you are age 31 or younger, your body mass index should be between 19-25. Your Body mass index is 35.55 kg/m. If this is out of the aformentioned range listed, please consider follow up with your Primary Care Provider.  ________________________________________________________  The [facility] providers would like to encourage you to use [facility] to communicate with providers for non-urgent requests or questions.  Due to long hold times on the telephone, sending your provider a message by [facility] may be a faster and more efficient way to get a response.  Please allow 48 business hours for a response.  Please remember that this is for non-urgent requests.  _______________________________________________________  Your provider has requested that you go to the basement level for lab work before leaving today. Press "B" on the elevator. The lab is located at the first door on the left as you exit the elevator.  You have been scheduled for an endoscopy and colonoscopy. Please follow the written instructions given to you at your visit today. Please pick up your prep supplies at the pharmacy within the next 1-3 days. If you use inhalers (even only as needed), please bring them with you on the day of your procedure.  Follow up pending the results of you Colonoscopy/Endoscopy or as needed.  Thank you for entrusting me with your care and choosing [facility].  Amy Esterwood, PA-C

## 2021-05-23 NOTE — Progress Notes (Signed)
Agree with assessment and plan as outlined.  

## 2021-07-05 ENCOUNTER — Encounter: Payer: Self-pay | Admitting: Gastroenterology

## 2021-07-10 ENCOUNTER — Ambulatory Visit (AMBULATORY_SURGERY_CENTER): Payer: No Typology Code available for payment source | Admitting: Gastroenterology

## 2021-07-10 ENCOUNTER — Encounter: Payer: Self-pay | Admitting: Gastroenterology

## 2021-07-10 VITALS — BP 115/76 | HR 56 | Temp 97.7°F | Resp 11 | Ht 67.0 in | Wt 230.0 lb

## 2021-07-10 DIAGNOSIS — K648 Other hemorrhoids: Secondary | ICD-10-CM | POA: Diagnosis not present

## 2021-07-10 DIAGNOSIS — R1031 Right lower quadrant pain: Secondary | ICD-10-CM

## 2021-07-10 DIAGNOSIS — R1013 Epigastric pain: Secondary | ICD-10-CM | POA: Diagnosis not present

## 2021-07-10 DIAGNOSIS — K2970 Gastritis, unspecified, without bleeding: Secondary | ICD-10-CM

## 2021-07-10 DIAGNOSIS — K625 Hemorrhage of anus and rectum: Secondary | ICD-10-CM | POA: Diagnosis not present

## 2021-07-10 DIAGNOSIS — R109 Unspecified abdominal pain: Secondary | ICD-10-CM

## 2021-07-10 DIAGNOSIS — K2950 Unspecified chronic gastritis without bleeding: Secondary | ICD-10-CM | POA: Diagnosis not present

## 2021-07-10 HISTORY — PX: COLONOSCOPY: SHX174

## 2021-07-10 HISTORY — PX: UPPER GASTROINTESTINAL ENDOSCOPY: SHX188

## 2021-07-10 MED ORDER — SODIUM CHLORIDE 0.9 % IV SOLN: 500.0000 mL | Freq: Once | INTRAVENOUS | Status: DC

## 2021-07-10 NOTE — Progress Notes (Signed)
Called to room to assist during endoscopic procedure.  Patient ID and intended procedure confirmed with present staff. Received instructions for my participation in the procedure from the performing physician.  

## 2021-07-10 NOTE — Op Note (Signed)
Erika Armstrong Patient Name: Erika Armstrong Procedure Date: 07/10/2021 1:23 PM MRN: [id number] Endoscopist: Remo Lipps P. [name] , MD Age: 31 Referring MD:  Date of Birth: 04-Dec-1990 Gender: Female Account #: [id number] Procedure:                Colonoscopy Indications:              Rectal bleeding - history of intermittent rectal                            bleeding Medicines:                Monitored Anesthesia Care Procedure:                Pre-Anesthesia Assessment:                           - Prior to the procedure, a History and Physical                            was performed, and patient medications and                            allergies were reviewed. The patient's tolerance of                            previous anesthesia was also reviewed. The risks                            and benefits of the procedure and the sedation                            options and risks were discussed with the patient.                            All questions were answered, and informed consent                            was obtained. Prior Anticoagulants: The patient has                            taken no previous anticoagulant or antiplatelet                            agents. ASA Grade Assessment: II - A patient with                            mild systemic disease. After reviewing the risks                            and benefits, the patient was deemed in                            satisfactory condition to undergo the procedure.  After obtaining informed consent, the colonoscope                            was passed under direct vision. Throughout the                            procedure, the patient's blood pressure, pulse, and                            oxygen saturations were monitored continuously. The                            PCF-HQ190L Colonoscope was introduced through the                            anus and advanced to the the terminal ileum,  with                            identification of the appendiceal orifice and IC                            valve. The colonoscopy was performed without                            difficulty. The patient tolerated the procedure                            well. The quality of the bowel preparation was                            good. The terminal ileum, ileocecal valve,                            appendiceal orifice, and rectum were photographed. Scope In: 1:44:00 PM Scope Out: 1:55:23 PM Scope Withdrawal Time: 0 hours 8 minutes 7 seconds  Total Procedure Duration: 0 hours 11 minutes 23 seconds  Findings:                 Hemorrhoids were found on perianal exam.                           The terminal ileum appeared normal.                           Internal hemorrhoids were found during                            retroflexion. The hemorrhoids were small.                           The exam was otherwise without abnormality. Complications:            No immediate complications. Estimated blood loss:  None. Estimated Blood Loss:     Estimated blood loss: none. Impression:               - Hemorrhoids found on perianal exam.                           - The examined portion of the ileum was normal.                           - Internal hemorrhoids.                           - The examination was otherwise normal.                           - No polyps                           Hemorrhoids are the cause of the patient's                            symptoms, causing mild intermittent bleeding. Recommendation:           - Patient has a contact number available for                            emergencies. The signs and symptoms of potential                            delayed complications were discussed with the                            patient. Return to normal activities tomorrow.                            Written discharge instructions were provided to the                             patient.                           - Resume previous diet.                           - Continue present medications.                           - Daily fiber supplement such as Citrucel /                            Metamucil to keep stools soft                           - Can try Calmol4 suppositories as needed                           - If bleeding persistent / common moving forward  consider hemorrhoid banding                           - repeat colonoscopy age 45 for screening purposes Erika Armstrong. [name], MD 07/10/2021 1:59:53 PM This report has been signed electronically.

## 2021-07-10 NOTE — Progress Notes (Signed)
Report to PACU, RN, vss, BBS= Clear.  

## 2021-07-10 NOTE — Patient Instructions (Signed)
Information given on High fiber diet.  Daily fiber supplement such as Citrucel/Metamucil to keep stools soft.   Can try Calmol4 suppositories as needed.  If bleeding persistent/common moving forward consider hemorrhoid banding.    Repeat colonoscopy age 31 for screening purposes.    Resume previous diet and medications.   Await pathology results.    YOU HAD AN ENDOSCOPIC PROCEDURE TODAY AT [facility]:   Refer to the procedure report that was given to you for any specific questions about what was found during the examination.  If the procedure report does not answer your questions, please call your gastroenterologist to clarify.  If you requested that your care partner not be given the details of your procedure findings, then the procedure report has been included in a sealed envelope for you to review at your convenience later.  YOU SHOULD EXPECT: Some feelings of bloating in the abdomen. Passage of more gas than usual.  Walking can help get rid of the air that was put into your GI tract during the procedure and reduce the bloating. If you had a lower endoscopy (such as a colonoscopy or flexible sigmoidoscopy) you may notice spotting of blood in your stool or on the toilet paper. If you underwent a bowel prep for your procedure, you may not have a normal bowel movement for a few days.  Please Note:  You might notice some irritation and congestion in your nose or some drainage.  This is from the oxygen used during your procedure.  There is no need for concern and it should clear up in a day or so.  SYMPTOMS TO REPORT IMMEDIATELY:  Following lower endoscopy (colonoscopy or flexible sigmoidoscopy):  Excessive amounts of blood in the stool  Significant tenderness or worsening of abdominal pains  Swelling of the abdomen that is new, acute  Fever of 100F or higher  Following upper endoscopy (EGD)  Vomiting of blood or coffee ground material  New chest pain or pain under the  shoulder blades  Painful or persistently difficult swallowing  New shortness of breath  Fever of 100F or higher  Black, tarry-looking stools  For urgent or emergent issues, a gastroenterologist can be reached at any hour by calling [phone]. Do not use MyChart messaging for urgent concerns.    DIET:  We do recommend a small meal at first, but then you may proceed to your regular diet.  Drink plenty of fluids but you should avoid alcoholic beverages for 24 hours.  ACTIVITY:  You should plan to take it easy for the rest of today and you should NOT DRIVE or use heavy machinery until tomorrow (because of the sedation medicines used during the test).    FOLLOW UP: Our staff will call the number listed on your records 48-72 hours following your procedure to check on you and address any questions or concerns that you may have regarding the information given to you following your procedure. If we do not reach you, we will leave a message.  We will attempt to reach you two times.  During this call, we will ask if you have developed any symptoms of COVID 19. If you develop any symptoms (ie: fever, flu-like symptoms, shortness of breath, cough etc.) before then, please call [phone].  If you test positive for Covid 19 in the 2 weeks post procedure, please call and report this information to [place].    If any biopsies were taken you will be contacted by phone or by  letter within the next 1-3 weeks.  Please call us at [phone] if you have not heard about the biopsies in 3 weeks.    SIGNATURES/CONFIDENTIALITY: You and/or your care partner have signed paperwork which will be entered into your electronic medical record.  These signatures attest to the fact that that the information above on your After Visit Summary has been reviewed and is understood.  Full responsibility of the confidentiality of this discharge information lies with you and/or your care-partner.

## 2021-07-10 NOTE — Progress Notes (Signed)
Pt's states no medical or surgical changes since previsit or office visit.  Vitals DT 

## 2021-07-10 NOTE — Progress Notes (Signed)
[facility] History and Physical   Primary Care Physician:  Joya Gaskins, FNP   Reason for Procedure:   Epigastric / RUQ pain, rectal bleeding  Plan:    EGD and colonoscopy     HPI: Erika Armstrong is a 31 y.o. female  here for EGD and colonoscopy to evaluate symptoms as above. S/p cholecystectomy, intermittent upper abdominal pain which has not been improved with PPI. Also history of a few episodes of large amount of rectal bleeding, none since January. Patient denies any bowel symptoms at this time. No family history of colon cancer known. Otherwise feels well without any cardiopulmonary symptoms.    Past Medical History:  Diagnosis Date   Allergy    Anxiety    and panic attacks   Anxiety    Arthritis    wrist, knees - otc med prn   Asthma    no inhaler - uses nebalizer   Bronchitis    history - per pt   Depression    Endometriosis    Gallstones    GERD (gastroesophageal reflux disease)    Headache(784.0)    otc med prn   Ovarian cyst    PAC (premature atrial contraction)    PVC (premature ventricular contraction)    Seasonal allergies    Thyroid disease    Hypothyroid - pt states no med - saw 2nd opinion - neg     Past Surgical History:  Procedure Laterality Date   CHOLECYSTECTOMY N/A 02/02/2020   Procedure: LAPAROSCOPIC CHOLECYSTECTOMY WITH INTRAOPERATIVE CHOLANGIOGRAM;  Surgeon: Alphonsa Overall, MD;  Location: [facility];  Service: General;  Laterality: N/A;   COLONOSCOPY  07/10/2021   DERMOID CYST  EXCISION     right side   DIAGNOSTIC LAPAROSCOPY     DILATION AND CURETTAGE OF UTERUS  10/25/2013   Procedure: DILATATION AND CURETTAGE;  Surgeon: Lyman Speller, MD;  Location: [facility];  Service: Gynecology;;   I & D EXTREMITY Right 04/06/2013   Procedure: REMOVAL OF IMPLANT RIGHT ARM--IMPLANT DISCARDED;  Surgeon: Cammie Sickle., MD;  Location: [facility];  Service: Orthopedics;  Laterality: Right;   LAPAROSCOPY Left  10/08/2012   Procedure: LAPAROSCOPY OPERATIVE;REMOVAL OF [place] AND  REPLACEMENT OF [place];  Surgeon: Terrance Mass, MD;  Location: [facility];  Service: Gynecology;  Laterality: Left;  left upper arm and right upper arm   LAPAROSCOPY N/A 10/25/2013   Procedure: LAPAROSCOPY DIAGNOSTIC WITH REMOVAL OF RIGHT PARATUBAL CYST, CHROMOPERTUBATION, RIGHT OVARIAN CYST DRAINAGE, left hydrosalpinx,lysis of adhesions;  Surgeon: Lyman Speller, MD;  Location: [facility];  Service: Gynecology;  Laterality: N/A;   nexplanon implant     stated 04/27/2020   nexplanon removed     UPPER GASTROINTESTINAL ENDOSCOPY  07/10/2021    Prior to Admission medications   Medication Sig Start Date End Date Taking? Authorizing Provider  acetaminophen (TYLENOL) 500 MG tablet You can take 1000 mg every 6 hours as needed for pain.  You can alternate this with ibuprofen.  You can buy this over the counter at any drug store. Do not take more than 4000 mg of Tylenol/acetaminophen per day, it can harm your liver. 02/02/20  Yes Earnstine Regal, PA-C  sertraline (ZOLOFT) 100 MG tablet Take 100 mg by mouth daily.   Yes [provider]  albuterol (PROVENTIL HFA;VENTOLIN HFA) 108 (90 Base) MCG/ACT inhaler Inhale 1-2 puffs into the lungs every 6 (six) hours as needed for wheezing or shortness of breath. Patient not taking: Reported  on 05/22/2021    [provider]    Current Outpatient Medications  Medication Sig Dispense Refill   acetaminophen (TYLENOL) 500 MG tablet You can take 1000 mg every 6 hours as needed for pain.  You can alternate this with ibuprofen.  You can buy this over the counter at any drug store. Do not take more than 4000 mg of Tylenol/acetaminophen per day, it can harm your liver. 30 tablet 0   sertraline (ZOLOFT) 100 MG tablet Take 100 mg by mouth daily.     albuterol (PROVENTIL HFA;VENTOLIN HFA) 108 (90 Base) MCG/ACT inhaler Inhale 1-2 puffs into the lungs every 6 (six) hours as needed  for wheezing or shortness of breath. (Patient not taking: Reported on 05/22/2021)     Current Facility-Administered Medications  Medication Dose Route Frequency Provider Last Rate Last Admin   0.9 %  sodium chloride infusion  500 mL Intravenous Once [place] Dagher, [name], MD        Allergies as of 07/10/2021 - Review Complete 07/10/2021  Allergen Reaction Noted   Seroquel [quetiapine fumerate] Swelling 02/07/2011   Seroquel [quetiapine] Swelling 03/07/2011   Compazine [prochlorperazine] Anxiety 07/08/2019   Tape Rash 10/01/2013    Family History  Problem Relation Age of Onset   Heart disease Mother    Colon polyps Father    Hyperlipidemia Father    Diabetes Father    Bone cancer Sister        mets   Colon polyps Maternal Aunt    COPD Maternal Grandmother    Lung disease Maternal Grandfather        agent orange   Hypertension Paternal Grandmother    Diabetes Paternal Grandmother    Irritable bowel syndrome Paternal Grandmother    Stroke Paternal Grandmother    COPD Paternal Grandfather    Colon cancer Neg Hx    Esophageal cancer Neg Hx    Stomach cancer Neg Hx    Rectal cancer Neg Hx     Social History   Socioeconomic History   Marital status: Married    Spouse name: Not on file   Number of children: 3   Years of education: Not on file   Highest education level: Not on file  Occupational History   Occupation: RN  Tobacco Use   Smoking status: Never   Smokeless tobacco: Never  Vaping Use   Vaping Use: Never used  Substance and Sexual Activity   Alcohol use: No    Comment: social -- 3-4 times per year    Drug use: Never   Sexual activity: Yes    Birth control/protection: Implant  Other Topics Concern   Not on file  Social History Narrative   Not on file   Social Determinants of Health   Financial Resource Strain: Not on file  Food Insecurity: Not on file  Transportation Needs: Not on file  Physical Activity: Not on file  Stress: Not on file  Social  Connections: Not on file  Intimate Partner Violence: Not on file    Review of Systems: All other review of systems negative except as mentioned in the HPI.  Physical Exam: Vital signs BP 124/83    Pulse 66    Temp 97.7 F (36.5 C) (Temporal)    Resp 13    Ht 5\' 7"  (1.702 m)    Wt 230 lb (104.3 kg)    SpO2 100%    BMI 36.02 kg/m   General:   Alert,  Well-developed, pleasant and cooperative in  NAD Lungs:  Clear throughout to auscultation.   Heart:  Regular rate and rhythm Abdomen:  Soft, nontender and nondistended.   Neuro/Psych:  Alert and cooperative. Normal mood and affect. A and O x 3  Jolly Mango, MD [facility]

## 2021-07-10 NOTE — Op Note (Signed)
Erika Armstrong Armstrong Procedure Date: 07/10/2021 1:25 PM MRN: [id number] Endoscopist: Remo Lipps P. [place] Erika Armstrong , Erika Armstrong Armstrong Age: 31 Referring Erika Armstrong Armstrong:  Date of Birth: 1990/08/10 Gender: Female Account #: [id number] Procedure:                Upper GI endoscopy Indications:              Epigastric abdominal pain, Abdominal pain in the                            right lower quadrant - history of cholecystectomy,                            no improvement with PPI, does not appear to be                            postprandial. CT scan / MRI Jan 2022 without overt                            pathology Medicines:                Monitored Anesthesia Care Procedure:                Pre-Anesthesia Assessment:                           - Prior to the procedure, a History and Physical                            was performed, and patient medications and                            allergies were reviewed. The patient's tolerance of                            previous anesthesia was also reviewed. The risks                            and benefits of the procedure and the sedation                            options and risks were discussed with the patient.                            All questions were answered, and informed consent                            was obtained. Prior Anticoagulants: The patient has                            taken no previous anticoagulant or antiplatelet                            agents. ASA Grade Assessment: II - A patient with  mild systemic disease. After reviewing the risks                            and benefits, the patient was deemed in                            satisfactory condition to undergo the procedure.                           After obtaining informed consent, the endoscope was                            passed under direct vision. Throughout the                            procedure, the patient's blood pressure,  pulse, and                            oxygen saturations were monitored continuously. The                            GIF [id number] #[id number] was introduced through the                            mouth, and advanced to the second part of duodenum.                            The upper GI endoscopy was accomplished without                            difficulty. The patient tolerated the procedure                            well. Scope In: Scope Out: Findings:                 Esophagogastric landmarks were identified: the                            Z-line was found at 40 cm, the gastroesophageal                            junction was found at 40 cm and the upper extent of                            the gastric folds was found at 40 cm from the                            incisors.                           There was a benign gastric inlet patch in the                            proximal  esophagus. The exam of the esophagus was                            otherwise normal.                           The entire examined stomach was normal. Biopsies                            were taken with a cold forceps for Helicobacter                            pylori testing.                           The duodenal bulb and second portion of the                            duodenum were normal. Complications:            No immediate complications. Estimated blood loss:                            Minimal. Estimated Blood Loss:     Estimated blood loss was minimal. Impression:               - Esophagogastric landmarks identified.                           - Benign gastric inlet patch in proximal esophagus.                           - Normal esophagus otherwise                           - Normal stomach. Biopsied.                           - Normal duodenal bulb and second portion of the                            duodenum.                           No overt cause on EGD to account for abdominal                             pain, which could be musculoskeletal, will await                            biopsy results. Recommendation:           - Patient has a contact number available for                            emergencies. The signs and symptoms of potential  delayed complications were discussed with the                            patient. Return to normal activities tomorrow.                            Written discharge instructions were provided to the                            patient.                           - Resume previous diet.                           - Continue present medications.                           - Await pathology results. Remo Lipps P. [name], Erika Armstrong Armstrong 07/10/2021 2:04:11 PM This report has been signed electronically.

## 2021-07-12 ENCOUNTER — Telehealth: Payer: Self-pay | Admitting: *Deleted

## 2021-07-12 NOTE — Telephone Encounter (Signed)
?  Follow up Call- ? ?Call back number 07/10/2021  ?Post procedure Call Back phone  # [phone]  ?Permission to leave phone message Yes  ?Some recent data might be hidden  ?  ? ?Patient questions: ? ?Do you have a fever, pain , or abdominal swelling? No. ?Pain Score  0 * ? ?Have you tolerated food without any problems? Yes.   ? ?Have you been able to return to your normal activities? Yes.   ? ?Do you have any questions about your discharge instructions: ?Diet   No. ?Medications  No. ?Follow up visit  No. ? ?Do you have questions or concerns about your Care? No. ? ?Actions: ?* If pain score is 4 or above: ?No action needed, pain <4. ? ? ?

## 2021-09-11 IMAGING — MR MR ABDOMEN WO/W CM MRCP
17 of 20 series · 39 of 48 positions shown · IV contrast (10.0 mL GADAVIST)
Comparison: 05/01/2020 CT abdomen.

CLINICAL DATA: Right upper quadrant abdominal pain. Indeterminate
right liver lesion on recent CT.

EXAM:
MRI ABDOMEN WITHOUT AND WITH CONTRAST (INCLUDING MRCP)
TECHNIQUE: Multiplanar multisequence MR imaging of the abdomen was performed
both before and after the administration of intravenous contrast.
Heavily T2-weighted images of the biliary and pancreatic ducts were
obtained, and three-dimensional MRCP images were rendered by post
processing.
CONTRAST:  10mL GADAVIST GADOBUTROL 1 MMOL/ML IV SOLN

[Series 3: T2 fat-sat · axial · 6.0mm · 1.14mm/px · 1 of 41 slices shown]
[im 1/41]
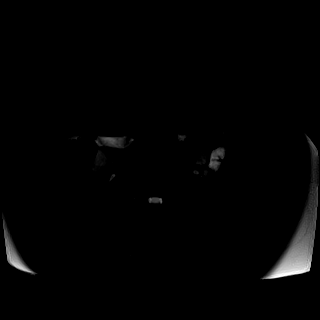

[Series 5: T2 · coronal · 6.0mm · 1.37mm/px · 1 of 33 slices shown (1 of 2)]
[im 1/33]
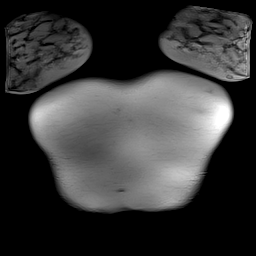

[Series 6: DWI · axial · 6.0mm · 1.36mm/px · z∈[-110,+170]mm · 2 of 80 slices shown (1 of 2)]
[im 1/80]
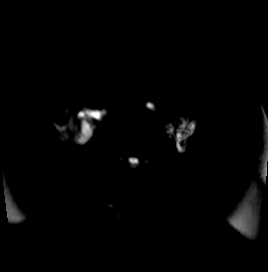
[im 80/80]
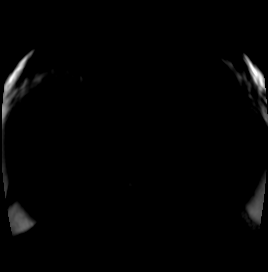

[Series 7: DWI · axial · 6.0mm · 1.36mm/px · 1 of 40 slices shown (2 of 2)]
[im 1/40]
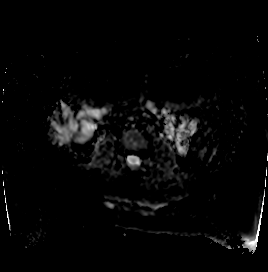

[Series 8: T1 · axial · 3.0mm · 1.09mm/px · z∈[-114,+147]mm · 3 of 88 slices shown (1 of 2)]
[im 1/88]
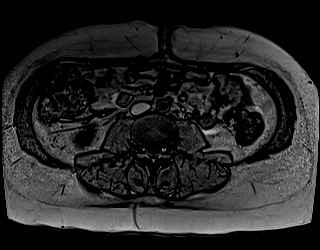
[im 44/88]
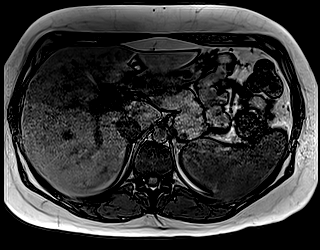
[im 88/88]
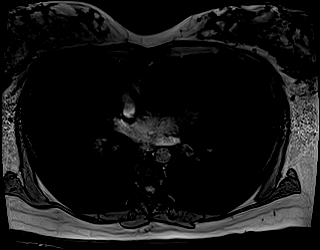

[Series 9: T1 · axial · 3.0mm · 1.09mm/px · z∈[-114,+147]mm · 3 of 88 slices shown (2 of 2)]
[im 1/88]
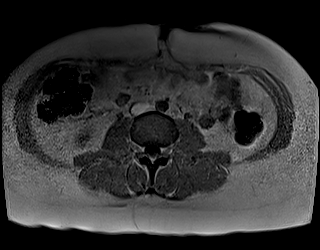
[im 44/88]
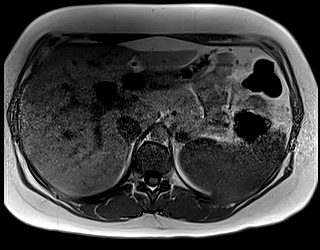
[im 88/88]
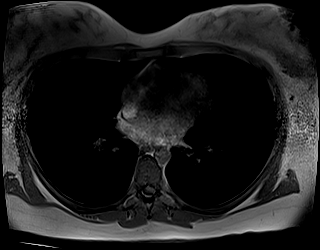

[Series 10: T2 · axial · 6.0mm · 1.37mm/px · 1 of 39 slices shown (2 of 2)]
[im 1/39]
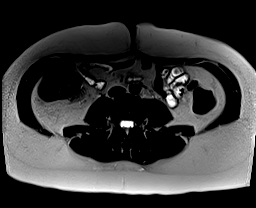

[Series 12: cor obl thk · sagittal · 50.0mm · 0.78mm/px · 1 of 9 slices shown]
[im 1/9]
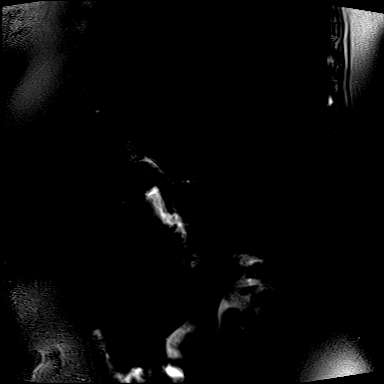

[Series 14: cor_3d_spc_trig · coronal · 1.0mm · 0.46mm/px · 3 of 80 slices shown]
[im 1/80]
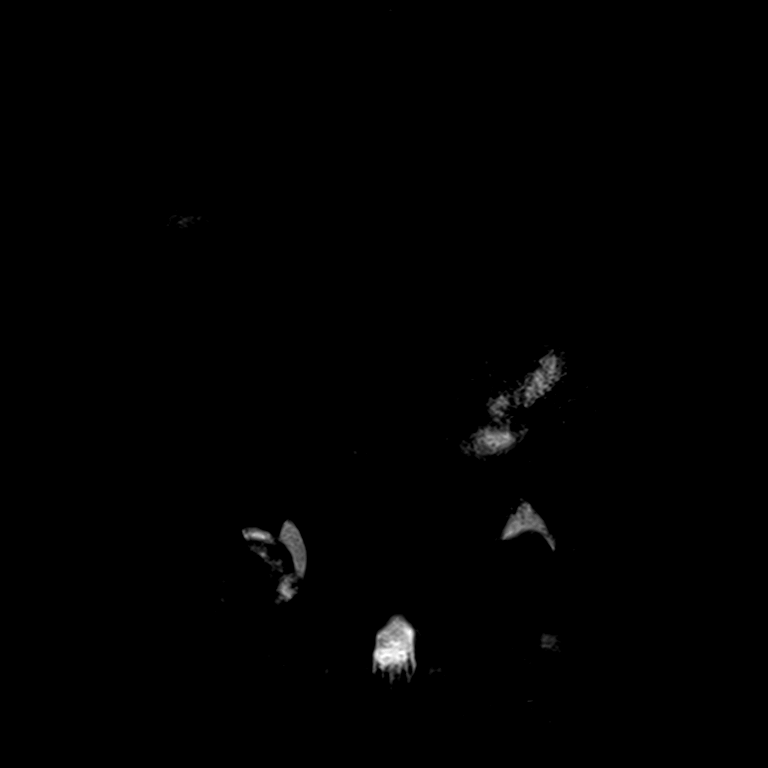
[im 40/80]
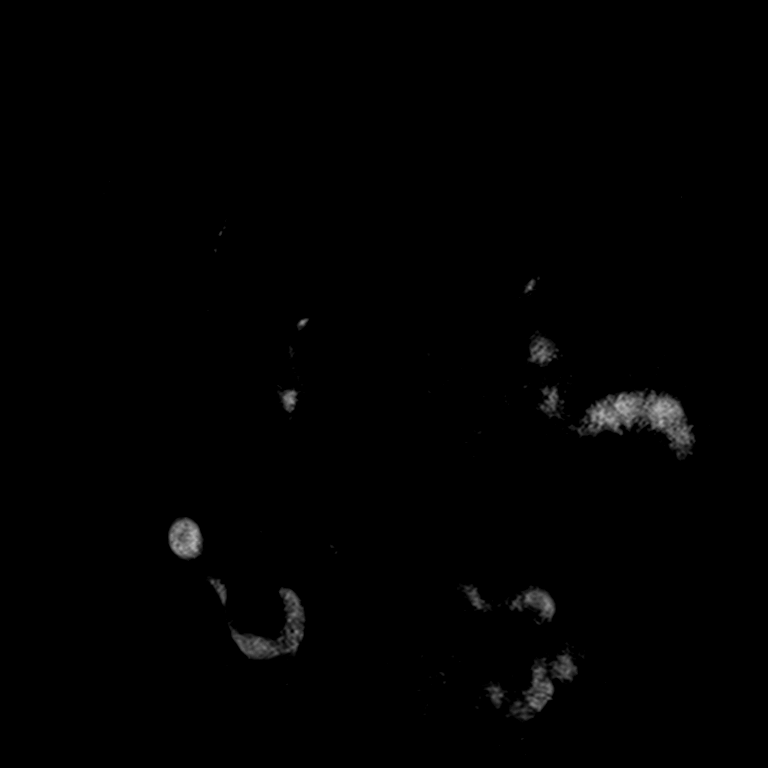
[im 80/80]
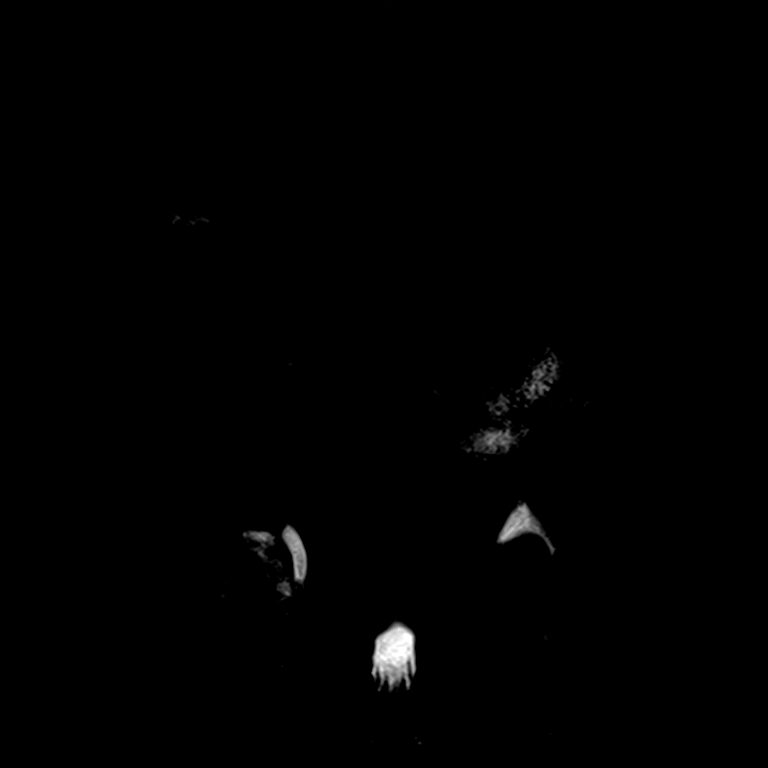

[Series 17: T1 dynamic · axial · 3.0mm · 1.09mm/px · z∈[-130,+155]mm · 3 of 96 slices shown (1 of 8)]
[im 1/96]
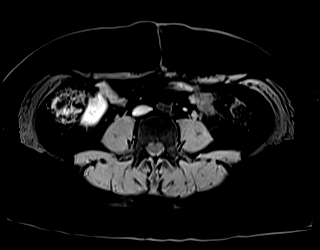
[im 48/96]
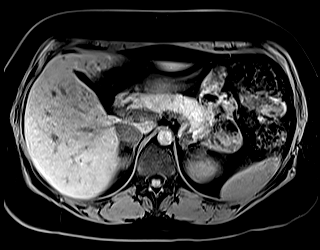
[im 96/96]
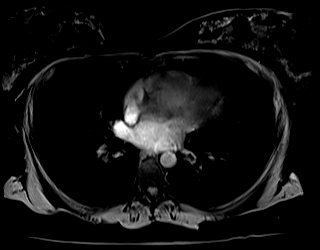

[Series 21: T1 dynamic · axial · 3.0mm · 1.09mm/px · z∈[-130,+155]mm · 3 of 96 slices shown (2 of 8)]
[im 1/96]
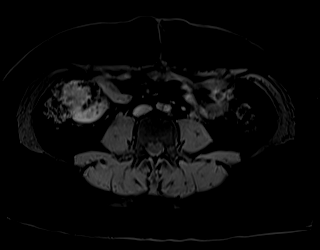
[im 48/96]
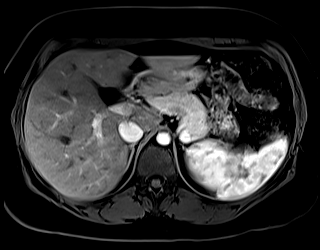
[im 96/96]
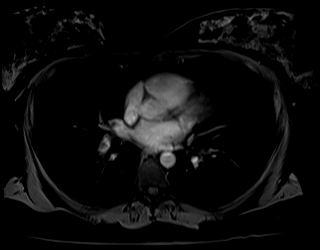

[Series 22: T1 dynamic · axial · 3.0mm · 1.09mm/px · z∈[-130,+155]mm · 3 of 96 slices shown (3 of 8)]
[im 1/96]
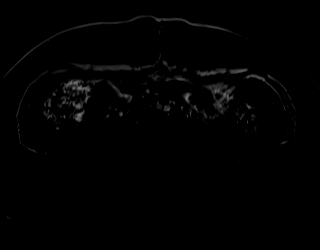
[im 48/96]
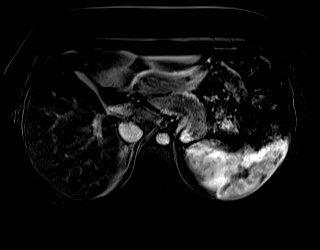
[im 96/96]
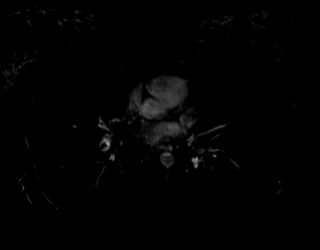

[Series 25: T1 dynamic · axial · 3.0mm · 1.09mm/px · z∈[-130,+155]mm · 3 of 96 slices shown (4 of 8)]
[im 1/96]
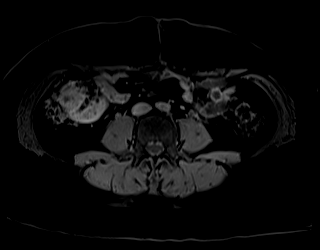
[im 48/96]
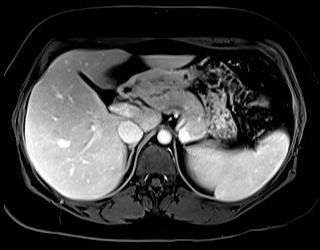
[im 96/96]
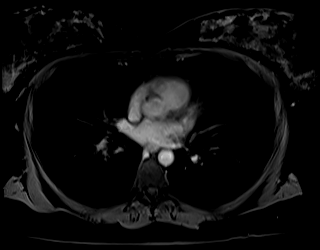

[Series 26: T1 dynamic · axial · 3.0mm · 1.09mm/px · z∈[-130,+155]mm · 3 of 96 slices shown (5 of 8)]
[im 1/96]
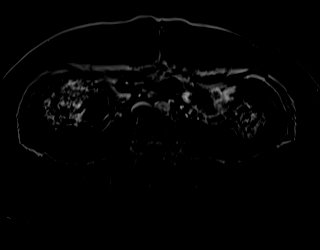
[im 48/96]
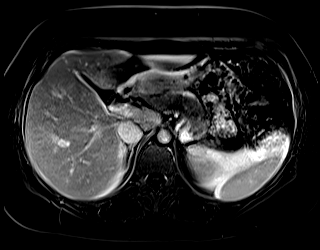
[im 96/96]
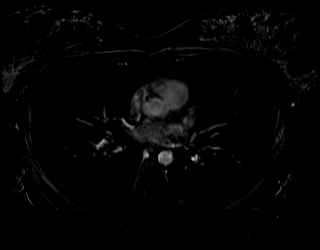

[Series 29: T1 dynamic · axial · 3.0mm · 1.09mm/px · z∈[-130,+155]mm · 3 of 96 slices shown (6 of 8)]
[im 1/96]
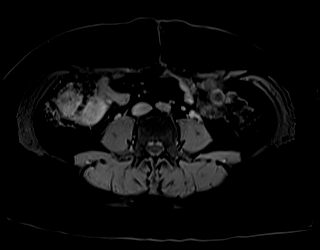
[im 48/96]
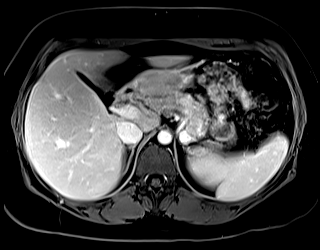
[im 96/96]
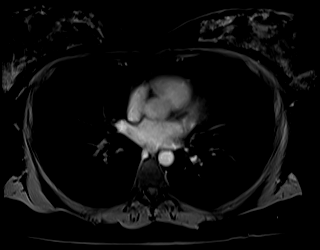

[Series 30: T1 dynamic · axial · 3.0mm · 1.09mm/px · z∈[-130,+155]mm · 3 of 96 slices shown (7 of 8)]
[im 1/96]
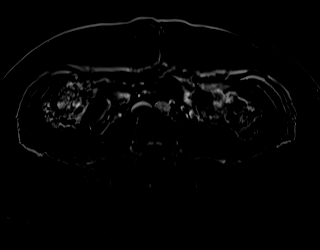
[im 48/96]
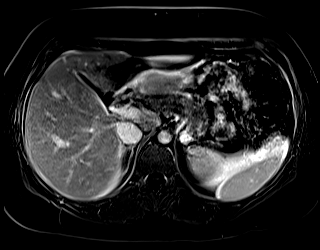
[im 96/96]
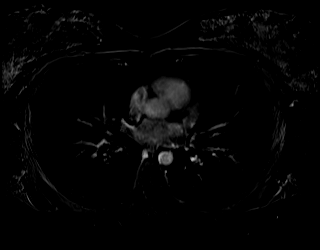

[Series 33: T1 dynamic · coronal · 3.0mm · 1.09mm/px · 2 of 80 slices shown (8 of 8)]
[im 1/80]
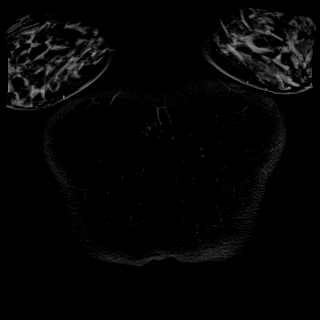
[im 40/80]
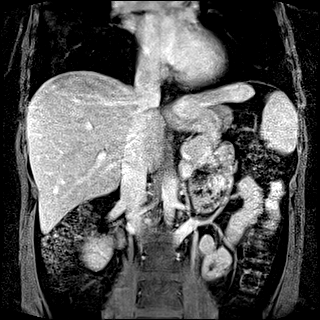

[39 of 48 positions shown; findings below may reference images not displayed]

FINDINGS: Lower chest: No acute abnormality at the lung bases.

Hepatobiliary: Normal liver size and configuration. No hepatic
steatosis. There are 2 mildly T2 hyperintense posterior right liver
masses measuring 1.1 cm (series 3/image 15) and 0.7 cm (series
3/image 13) demonstrating progressive discontinuous peripheral
nodular enhancement compatible with hemangiomas. No additional liver
masses. Cholecystectomy. Bile ducts are within normal post
cholecystectomy limits. Common bile duct diameter 4 mm. No
choledocholithiasis. No biliary masses, strictures or beading.

Pancreas: No pancreatic mass or duct dilation.  No pancreas divisum.

Spleen: Normal size. No mass.

Adrenals/Urinary Tract: Normal adrenals. No hydronephrosis. Normal
kidneys with no renal mass.

Stomach/Bowel: Normal non-distended stomach. Visualized small and
large bowel is normal caliber, with no bowel wall thickening.

Vascular/Lymphatic: Normal caliber abdominal aorta. Patent portal,
splenic, hepatic and renal veins. Retroaortic left renal vein. No
pathologically enlarged lymph nodes in the abdomen.

Other: No abdominal ascites or focal fluid collection.

Musculoskeletal: No aggressive appearing focal osseous lesions.
IMPRESSION: 1. Two small hemangiomas in the posterior right liver. No suspicious
liver lesions.
2. Bile ducts are within normal post cholecystectomy limits. CBD
diameter 4 mm. No evidence of choledocholithiasis or other biliary
complication.

## 2022-01-30 ENCOUNTER — Encounter: Payer: No Typology Code available for payment source | Admitting: Radiology

## 2022-02-05 ENCOUNTER — Encounter: Payer: Self-pay | Admitting: Radiology

## 2022-02-05 ENCOUNTER — Ambulatory Visit (INDEPENDENT_AMBULATORY_CARE_PROVIDER_SITE_OTHER): Payer: No Typology Code available for payment source | Admitting: Radiology

## 2022-02-05 VITALS — BP 100/70 | Ht 68.0 in | Wt 240.0 lb

## 2022-02-05 DIAGNOSIS — R1031 Right lower quadrant pain: Secondary | ICD-10-CM | POA: Diagnosis not present

## 2022-02-05 NOTE — Progress Notes (Signed)
   Erika Armstrong 11/09/1990 [id number]   History:  31 y.o. G7P3 Complains of RLQ pain intermittent x's 3 months. Pain will last minutes, never longer. No pain with intercourse. Denies any GI or urinary symptoms associated with it. Has irregular periods with Nexplanon (placed 12/2019 left arm--per patient) does not noticed pain is associated with menses. Hx ectopic 2015 and 2016. Hx of ovarian cysts as well. Has noticed pain happens more after exercise. Has had pain in the right hip since her last pregnancy 2 years ago.  Gynecologic History No LMP recorded (approximate). Patient has had an implant. Period Pattern: (!) Irregular Contraception/Family planning: Nexplanon Sexually active: yes Last Pap: 2020. Results were: normal   Obstetric History OB History  Gravida Para Term Preterm AB Living  '7 3 1   3 3  '$ SAB IAB Ectopic Multiple Live Births  '1   2   3    '$ # Outcome Date GA Lbr Len/2nd Weight Sex Delivery Anes PTL Lv  7 SAB 2015             Birth Comments: System Generated. Please review and update pregnancy details.  6 Para           5 Para           4 Ectopic           3 Term           2 Ectopic           1 Gravida              The following portions of the patient's history were reviewed and updated as appropriate: allergies, current medications, past family history, past medical history, past social history, past surgical history, and problem list.  Review of Systems Pertinent items noted in HPI and remainder of comprehensive ROS otherwise negative.   Past medical history, past surgical history, family history and social history were all reviewed and documented in the EPIC chart.   Exam:  Vitals:   02/05/22 1056  BP: 100/70  Weight: 240 lb (108.9 kg)  Height: '5\' 8"'$  (1.727 m)   Body mass index is 36.49 kg/m.  General appearance:  Normal Abdominal  Soft,nontender, without masses, guarding or rebound.  Liver/spleen:  No organomegaly noted  Hernia:  None  appreciated Genitourinary   Inguinal/mons:  Normal without inguinal adenopathy  External genitalia:  Normal appearing vulva with no masses, tenderness, or lesions  BUS/Urethra/Skene's glands:  Normal without masses or exudate  Vagina:  Normal appearing with normal color and discharge, no lesions  Cervix:  Normal appearing without discharge or lesions  Uterus:  Normal in size, shape and contour.  Mobile, nontender  Adnexa/parametria:     Rt: Normal in size, without masses or tenderness.   Lt: Normal in size, without masses or tenderness.  Anus and perineum: Normal  Urine dipstick shows positive for RBC's and positive for leukocytes.  Micro exam: 20-40 WBC's per HPF, 3-10 RBC's per HPF, and many + bacteria.    Chaperone offered and declined  Assessment/Plan:   1. RLQ abdominal pain May be musculoskeletal but we will order u/s to r/o gyn cause based on her hx  - Pregnancy, urine; neg - Urinalysis,Complete w/RFL Culture - US Transvaginal Non-OB; Future    Rubbie Battiest B WHNP-BC 11:19 AM 02/05/2022

## 2022-02-07 LAB — URINALYSIS, COMPLETE W/RFL CULTURE
Bilirubin Urine: NEGATIVE
Glucose, UA: NEGATIVE
Hyaline Cast: NONE SEEN /LPF
Ketones, ur: NEGATIVE
Nitrites, Initial: NEGATIVE
Protein, ur: NEGATIVE
Specific Gravity, Urine: 1.025 (ref 1.001–1.035)
pH: 5 (ref 5.0–8.0)

## 2022-02-07 LAB — URINE CULTURE
MICRO NUMBER:: 13969076
SPECIMEN QUALITY:: ADEQUATE

## 2022-02-07 LAB — PREGNANCY, URINE: Preg Test, Ur: NEGATIVE

## 2022-02-07 LAB — CULTURE INDICATED

## 2022-02-14 ENCOUNTER — Ambulatory Visit (INDEPENDENT_AMBULATORY_CARE_PROVIDER_SITE_OTHER): Payer: No Typology Code available for payment source

## 2022-02-14 ENCOUNTER — Ambulatory Visit (INDEPENDENT_AMBULATORY_CARE_PROVIDER_SITE_OTHER): Payer: No Typology Code available for payment source | Admitting: Radiology

## 2022-02-14 VITALS — BP 124/76

## 2022-02-14 DIAGNOSIS — R1031 Right lower quadrant pain: Secondary | ICD-10-CM | POA: Diagnosis not present

## 2022-02-20 NOTE — Progress Notes (Signed)
   Erika Armstrong Oct 15, 1990 [id number]   History:  31 y.o. here for f/u u/s to further assess RLQ pain, worse with exercise. Hx of dermoid tumor and ectopic pregnancy.    Obstetric History OB History  Gravida Para Term Preterm AB Living  '7 3 1   3 3  '$ SAB IAB Ectopic Multiple Live Births  '1   2   3    '$ # Outcome Date GA Lbr Len/2nd Weight Sex Delivery Anes PTL Lv  7 SAB 2015             Birth Comments: System Generated. Please review and update pregnancy details.  6 Para           5 Para           4 Ectopic           3 Term           2 Ectopic           1 Gravida              The following portions of the patient's history were reviewed and updated as appropriate: allergies, current medications, past family history, past medical history, past social history, past surgical history, and problem list.  Review of Systems Pertinent items noted in HPI and remainder of comprehensive ROS otherwise negative.   Past medical history, past surgical history, family history and social history were all reviewed and documented in the EPIC chart.   Exam:  Vitals:   02/14/22 1226  BP: 124/76   There is no height or weight on file to calculate BMI.  Indication: RLQ pain   Anteverted uterus normal size and shape   Symmetrical endometrium 6.15m   Right ovary partial resection due to previous large dermoid, atrophic with an echogenic focal area concerning for possible dermoid cyst/tumor 1.0cm x 0.6cm. Positive perfusion to the ovary Left ovary is normal in size with normal follicle pattern, and positive perfusion   Prominent right adnexal dilated vessel seen    No adnexal masses no free fluid  Ultrasound reviewed by Dr JTalbert Nanand SJohnsie Kindred RDMS as well, as there was suspicion of slow flow within the dilated vessel by original son[facility], RDMS. Occlusion and slow flow was not seen.  Assessment/Plan:   1. RLQ abdominal pain Repeat u/s 1 year to follow up on  possible dermoid Pain likely musculoskeletal   [name] B WHNP-BC 11:12 AM 02/20/2022

## 2023-02-08 ENCOUNTER — Emergency Department (HOSPITAL_COMMUNITY): Payer: No Typology Code available for payment source

## 2023-02-08 ENCOUNTER — Emergency Department (HOSPITAL_COMMUNITY)
Admission: EM | Admit: 2023-02-08 | Discharge: 2023-02-08 | Disposition: A | Discharge status: Home / Self Care | Payer: No Typology Code available for payment source | Attending: Emergency Medicine | Admitting: Emergency Medicine

## 2023-02-08 ENCOUNTER — Other Ambulatory Visit: Payer: Self-pay

## 2023-02-08 ENCOUNTER — Encounter (HOSPITAL_COMMUNITY): Payer: Self-pay

## 2023-02-08 DIAGNOSIS — K529 Noninfective gastroenteritis and colitis, unspecified: Secondary | ICD-10-CM | POA: Insufficient documentation

## 2023-02-08 DIAGNOSIS — R8289 Other abnormal findings on cytological and histological examination of urine: Secondary | ICD-10-CM | POA: Insufficient documentation

## 2023-02-08 DIAGNOSIS — K625 Hemorrhage of anus and rectum: Secondary | ICD-10-CM | POA: Diagnosis present

## 2023-02-08 LAB — CBC WITH DIFFERENTIAL/PLATELET
Abs Immature Granulocytes: 0.03 10*3/uL (ref 0.00–0.07)
Basophils Absolute: 0.1 10*3/uL (ref 0.0–0.1)
Basophils Relative: 1 %
Eosinophils Absolute: 0.2 10*3/uL (ref 0.0–0.5)
Eosinophils Relative: 2 %
HCT: 43.6 % (ref 36.0–46.0)
Hemoglobin: 14.5 g/dL (ref 12.0–15.0)
Immature Granulocytes: 0 %
Lymphocytes Relative: 27 %
Lymphs Abs: 2.2 10*3/uL (ref 0.7–4.0)
MCH: 30 pg (ref 26.0–34.0)
MCHC: 33.3 g/dL (ref 30.0–36.0)
MCV: 90.3 fL (ref 80.0–100.0)
Monocytes Absolute: 0.8 10*3/uL (ref 0.1–1.0)
Monocytes Relative: 10 %
Neutro Abs: 4.9 10*3/uL (ref 1.7–7.7)
Neutrophils Relative %: 60 %
Platelets: 250 10*3/uL (ref 150–400)
RBC: 4.83 MIL/uL (ref 3.87–5.11)
RDW: 13.2 % (ref 11.5–15.5)
WBC: 8.1 10*3/uL (ref 4.0–10.5)
nRBC: 0 % (ref 0.0–0.2)

## 2023-02-08 LAB — TYPE AND SCREEN
ABO/RH(D): O POS
Antibody Screen: NEGATIVE

## 2023-02-08 LAB — HCG, SERUM, QUALITATIVE: Preg, Serum: NEGATIVE

## 2023-02-08 LAB — URINALYSIS, ROUTINE W REFLEX MICROSCOPIC
Bilirubin Urine: NEGATIVE
Glucose, UA: NEGATIVE mg/dL
Ketones, ur: NEGATIVE mg/dL
Nitrite: NEGATIVE
Protein, ur: NEGATIVE mg/dL
Specific Gravity, Urine: 1.02 (ref 1.005–1.030)
pH: 5 (ref 5.0–8.0)

## 2023-02-08 LAB — COMPREHENSIVE METABOLIC PANEL
ALT: 17 U/L (ref 0–44)
AST: 21 U/L (ref 15–41)
Albumin: 3.7 g/dL (ref 3.5–5.0)
Alkaline Phosphatase: 96 U/L (ref 38–126)
Anion gap: 10 (ref 5–15)
BUN: 8 mg/dL (ref 6–20)
CO2: 25 mmol/L (ref 22–32)
Calcium: 8.6 mg/dL — ABNORMAL LOW (ref 8.9–10.3)
Chloride: 104 mmol/L (ref 98–111)
Creatinine, Ser: 0.58 mg/dL (ref 0.44–1.00)
GFR, Estimated: >60 mL/min (ref >60)
Glucose, Bld: 90 mg/dL (ref 70–99)
Potassium: 3.6 mmol/L (ref 3.5–5.1)
Sodium: 139 mmol/L (ref 135–145)
Total Bilirubin: 0.9 mg/dL (ref 0.3–1.2)
Total Protein: 7.6 g/dL (ref 6.5–8.1)

## 2023-02-08 LAB — OCCULT BLOOD X 1 CARD TO LAB, STOOL: Fecal Occult Bld: POSITIVE — AB

## 2023-02-08 MED ORDER — IOHEXOL 350 MG/ML SOLN
100.0000 mL | Freq: Once | INTRAVENOUS | Status: AC | PRN
  Administered 2023-02-08: 100 mL via INTRAVENOUS

## 2023-02-08 MED ORDER — PANTOPRAZOLE SODIUM 40 MG IV SOLR
40.0000 mg | Freq: Once | INTRAVENOUS | Status: AC
  Administered 2023-02-08: 40 mg via INTRAVENOUS
  Filled 2023-02-08: qty 10

## 2023-02-08 MED ORDER — AMOXICILLIN-POT CLAVULANATE 875-125 MG PO TABS
1.0000 | ORAL_TABLET | Freq: Two times a day (BID) | ORAL | 0 refills | Status: DC
Start: 2023-02-08 — End: 2023-07-01

## 2023-02-08 MED ORDER — LACTATED RINGERS IV BOLUS
1000.0000 mL | Freq: Once | INTRAVENOUS | Status: AC
  Administered 2023-02-08: 1000 mL via INTRAVENOUS

## 2023-02-08 NOTE — ED Provider Notes (Signed)
[place] EMERGENCY DEPARTMENT AT [facility] Provider Note   CSN: [id number] Arrival date & time: 02/08/23  1529     History  Chief Complaint  Patient presents with   Rectal Bleeding   Abdominal Pain    Erika Armstrong is a 32 y.o. female.  61-year-old female presents today with right red blood from rectum that occurred once today.  She states that she has had some diarrhea, abdominal discomfort for the past several days.  She was seen by urgent care this morning.  Following the visit at urgent care she had rectal bleeding.  She states it was gross blood along with some clots that cover the toilet bowl.  Has not had another episode since then.  Does not have history of GI bleed.  Denies any significant NSAID use, or alcohol use.  The history is provided by the patient. No language interpreter was used.       Home Medications Prior to Admission medications   Medication Sig Start Date End Date Taking? Authorizing Provider  acetaminophen (TYLENOL) 500 MG tablet You can take 1000 mg every 6 hours as needed for pain.  You can alternate this with ibuprofen.  You can buy this over the counter at any drug store. Do not take more than 4000 mg of Tylenol/acetaminophen per day, it can harm your liver. 02/02/20   Sherrie George, PA-C  albuterol (PROVENTIL HFA;VENTOLIN HFA) 108 (90 Base) MCG/ACT inhaler Inhale 1-2 puffs into the lungs every 6 (six) hours as needed for wheezing or shortness of breath.    [provider]  buPROPion (WELLBUTRIN XL) 150 MG 24 hr tablet Take 150 mg by mouth daily. 01/06/22   [provider]  co-enzyme Q-10 30 MG capsule Take by mouth.    [provider]  etonogestrel (NEXPLANON) 68 MG IMPL implant Inserted 12/2019 left arm--per patient    [provider]  Multiple Vitamin (MULTIVITAMIN PO) Take by mouth.    [provider]  sertraline (ZOLOFT) 100 MG tablet Take 100 mg by mouth daily.    [provider]      Allergies    Seroquel [quetiapine fumerate], Seroquel [quetiapine], Compazine [prochlorperazine], and Tape    Review of Systems   Review of Systems  Constitutional:  Negative for fever.  Gastrointestinal:  Positive for abdominal pain, anal bleeding, blood in stool and diarrhea. Negative for nausea and vomiting.  Genitourinary:  Negative for dysuria and hematuria.  Neurological:  Negative for light-headedness.  All other systems reviewed and are negative.   Physical Exam Updated Vital Signs BP (!) 136/95 (BP Location: Right Arm)   Pulse 98   Temp 98.5 F (36.9 C) (Oral)   Resp 18   Ht 5\' 8"  (1.727 m)   Wt 100.2 kg   SpO2 100%   BMI 33.60 kg/m  Physical Exam Vitals and nursing note reviewed.  Constitutional:      General: She is not in acute distress.    Appearance: Normal appearance. She is not ill-appearing.  HENT:     Head: Normocephalic and atraumatic.     Nose: Nose normal.  Eyes:     Conjunctiva/sclera: Conjunctivae normal.  Cardiovascular:     Rate and Rhythm: Normal rate and regular rhythm.  Pulmonary:     Effort: Pulmonary effort is normal. No respiratory distress.  Abdominal:     General: There is no distension.     Palpations: Abdomen is soft.     Tenderness: There is no  abdominal tenderness. There is no guarding.  Musculoskeletal:        General: No deformity.  Skin:    Findings: No rash.  Neurological:     Mental Status: She is alert.     ED Results / Procedures / Treatments   Labs (all labs ordered are listed, but only abnormal results are displayed) Labs Reviewed  COMPREHENSIVE METABOLIC PANEL - Abnormal; Notable for the following components:      Result Value   Calcium 8.6 (*)    All other components within normal limits  URINALYSIS, ROUTINE W REFLEX MICROSCOPIC - Abnormal; Notable for the following components:   APPearance HAZY (*)    Hgb urine dipstick MODERATE (*)    Leukocytes,Ua SMALL (*)    Bacteria, UA RARE (*)    All  other components within normal limits  URINE CULTURE  HCG, SERUM, QUALITATIVE  CBC WITH DIFFERENTIAL/PLATELET  OCCULT BLOOD X 1 CARD TO LAB, STOOL  POC OCCULT BLOOD, ED  TYPE AND SCREEN    EKG None  Radiology No results found.  Procedures Procedures    Medications Ordered in ED Medications  lactated ringers bolus 1,000 mL (1,000 mLs Intravenous New Bag/Given 02/08/23 1642)  pantoprazole (PROTONIX) injection 40 mg (40 mg Intravenous Given 02/08/23 1713)    ED Course/ Medical Decision Making/ A&P                                 Medical Decision Making Amount and/or Complexity of Data Reviewed Labs: ordered. Radiology: ordered.  Risk Prescription drug management.   32 year old female presents today for rectal bleeding.  Has had 1 episode today.  No prior history of GI bleeding.  Abdomen soft nontender without distention.  Hemodynamically stable however heart rate close to borderline tachycardia.  CBC without leukocytosis or anemia.  CMP without acute concern.  UA does show moderate hemoglobin, small leukocytes, and 11-20 WBCs.  Will send urine culture.  Fecal occult blood test obtained.  Will obtain CT angio GI bleed study.  Fluid bolus, and Protonix dose given.  CT angio GI bleed study without evidence of acute GI bleed.  Does show evidence of colitis.  Will start patient on Augmentin.  She is in agreeable with this plan.  Will give referral to GI.  Discussed importance of following up with gastroenterology.  Patient is stable for discharge.  Discharged in stable condition.  Return precautions discussed.   Final Clinical Impression(s) / ED Diagnoses Final diagnoses:  Colitis    Rx / DC Orders ED Discharge Orders          Ordered    amoxicillin-clavulanate (AUGMENTIN) 875-125 MG tablet  Every 12 hours        02/08/23 2033              Marita Kansas, PA-C 02/08/23 2034    Horton, Clabe Seal, DO 02/09/23 1528

## 2023-02-08 NOTE — ED Triage Notes (Signed)
Pt arrived via POV. C/o abd pain, nausea, and diarrhea for 4x days. Went to UC today. Once they got home they had bloody diarrhea that filled the bowl.  AOx4

## 2023-02-08 NOTE — Discharge Instructions (Signed)
Your workup today showed evidence of colitis.  This could be leading to your rectal bleeding.  If you have significant episodes return for evaluation advise follow-up with gastroenterology.  I have sent antibiotic into the pharmacy for you.

## 2023-02-10 LAB — URINE CULTURE

## 2023-02-25 ENCOUNTER — Telehealth: Payer: Self-pay | Admitting: Hematology and Oncology

## 2023-02-25 NOTE — Telephone Encounter (Signed)
Scheduled appointments per referral. Patient is aware of the appointment time and date as well as the address. Patient was informed to arrive 10-15 minutes prior with updated insurance information. All questions were answered.

## 2023-03-21 ENCOUNTER — Inpatient Hospital Stay: Payer: No Typology Code available for payment source

## 2023-03-21 ENCOUNTER — Inpatient Hospital Stay: Payer: No Typology Code available for payment source | Admitting: Hematology and Oncology

## 2023-03-21 ENCOUNTER — Telehealth: Payer: Self-pay | Admitting: *Deleted

## 2023-03-21 NOTE — Telephone Encounter (Signed)
TCT patient per Dr. Derek Mound request regarding her new patient appt scheduled for today. Spoke with her. Advised that Dr. Leonides Schanz had reviewed her chart, imaging results and labs. Advised that her labs were normal and her spleen ws of normal size  Advised that there was not a reason for her evaluation in this clinic.  She states her spleen goes up and down in size. Advised that per Dr. Leonides Schanz, that would be more a inflammatory or infectious issue. Pt voiced understanding and is ok to cancel the appt for today.

## 2023-06-19 ENCOUNTER — Telehealth: Payer: Self-pay

## 2023-06-19 NOTE — Telephone Encounter (Signed)
 Pt LVM in triage line stating that she has been having period like bleeding since a week before Christmas and then had a gush of blood come out while getting in/out of shower today. Pt stated that she called the appt desk for an appt and now scheduled for 2/10.  Spoke w/ the pt:  Pt reports LMP of 04/28/2023 or 04/30/2023  Pt reports the bleeding has been intermittent as far as flow from light to heavy throughout & at its heaviest, would have to change pad/tampons within q2-3 hours.   Pt reports clotting is also present.   Pt reports some mild cramping here and there but did have some sharp pains on rt side like when she was seen prior but since last night, the pain has subsided.   Pt also reports fatigue that is mainly noticeable since the gush. Denies weakness, dizziness.  Pt also reports that she still has nexplanon  in since 12/2019. However, she was advised by the provider/office that performed the insertion that it was good for contraceptive for 4 years. However, throughout the month of 03/2023. Pt reported having strange hormonal sxs, like nausea, unusual mood swings, & acne. Pt reports taking UPT at home around that time but nothing recent.   Please advise.

## 2023-06-19 NOTE — Telephone Encounter (Signed)
 Pt notified and voiced understanding. Encounter closed.

## 2023-06-19 NOTE — Telephone Encounter (Signed)
 Recommend UPT at home. We will discuss bleeding management and plan for Nexplanon  at her visit. Ibuprofen  800 mg BID x 5 days to help slow bleeding.

## 2023-06-23 ENCOUNTER — Ambulatory Visit: Payer: No Typology Code available for payment source | Admitting: Nurse Practitioner

## 2023-07-01 ENCOUNTER — Ambulatory Visit (INDEPENDENT_AMBULATORY_CARE_PROVIDER_SITE_OTHER): Payer: No Typology Code available for payment source | Admitting: Obstetrics and Gynecology

## 2023-07-01 ENCOUNTER — Encounter: Payer: Self-pay | Admitting: Obstetrics and Gynecology

## 2023-07-01 VITALS — BP 118/82 | HR 86

## 2023-07-01 DIAGNOSIS — N926 Irregular menstruation, unspecified: Secondary | ICD-10-CM

## 2023-07-01 DIAGNOSIS — D369 Benign neoplasm, unspecified site: Secondary | ICD-10-CM

## 2023-07-01 DIAGNOSIS — Z3046 Encounter for surveillance of implantable subdermal contraceptive: Secondary | ICD-10-CM | POA: Diagnosis not present

## 2023-07-01 DIAGNOSIS — Z975 Presence of (intrauterine) contraceptive device: Secondary | ICD-10-CM

## 2023-07-01 LAB — PREGNANCY, URINE: Preg Test, Ur: NEGATIVE

## 2023-07-01 MED ORDER — ETONOGESTREL 68 MG ~~LOC~~ IMPL
68.0000 mg | DRUG_IMPLANT | Freq: Once | SUBCUTANEOUS | Status: AC
Start: 2023-07-01 — End: ?

## 2023-07-01 NOTE — Progress Notes (Signed)
 Acute Office Visit  Subjective:    Patient ID: Erika Armstrong, female    DOB: May 25, 1990, 33 y.o.   MRN: [id number]   HPI 33 y.o. presents today for abnormal bleeding (Abnormal bleeding//jj/Pt c/o or abnormal bleeding that started 04-28-23 & just stopped last week. Patient had nexplanon placed 8/21 & states the provider who placed it told her it was good for 49yrs.) . Patient reports she bled for 1 1/2 months and it has stopped now.  She reports she had a gush of blood that ran down her knees and pain in her lower back.  The night before last, she had a sharp pain on the RLQ that went away.  She has been told she has endometriosis and this was found and some removed at her ectopic pregnancy.  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 She reports complications during her pregnancy.  Her mother was exposed to DES. This is her 3rd nexplanon and this has worked well for her.  She did have one migrate in her right arm and had surgery to remove it.  She would like this one removed and a new one placed.  She will also need her annual.  She has 3 kids and her husband is disabled and they do not want any more children in the future.  She has tried depo in the past and felt weird on it.  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 Tried the nuva ring and had recurrent yeast infections on it.  Patient's last menstrual period was 04/28/2023. Period Pattern: (!) Irregular Menstrual Flow:  (varies) Menstrual Control: Maxi pad, Tampon Dysmenorrhea:  (most of the time mild cramping, 1 day severe cramping) Last pap smear in 2022 at [facility].  Does not have epic but reports it was normal  Review of Systems    No results found for: "DIAGPAP", "HPVHIGH", "ADEQPAP"  High Risk HPV: Positive  Adequacy:  Satisfactory for evaluation, transformation zone component PRESENT  Diagnosis:  Atypical squamous cells of undetermined  significance (ASC-US)  Objective:    OBGyn Exam  BP 118/82   Pulse 86   LMP 04/28/2023 Comment: lasted until last week  SpO2 98%  Wt Readings from Last 3 Encounters:  02/08/23 221 lb (100.2 kg)  02/05/22 240 lb (108.9 kg)  07/10/21 230 lb (104.3 kg)   Past Medical History:  Diagnosis Date   Allergy    Anxiety    and panic attacks   Anxiety    Arthritis    wrist, knees - otc med prn   Asthma    no inhaler - uses nebalizer   Bronchitis    history - per pt   Depression    Endometriosis    Gallstones    GERD (gastroesophageal reflux disease)    Headache(784.0)    otc med prn   Ovarian cyst    PAC (premature atrial contraction)    PVC (premature ventricular contraction)    Seasonal allergies    Thyroid disease    Hypothyroid - pt states no med - saw 2nd opinion - neg     Past Surgical History:  Procedure Laterality Date   CHOLECYSTECTOMY N/A 02/02/2020   Procedure: LAPAROSCOPIC CHOLECYSTECTOMY WITH INTRAOPERATIVE CHOLANGIOGRAM;  Surgeon: Ovidio Kin, MD;  Location: [facility];  Service: General;  Laterality: N/A;   COLONOSCOPY  07/10/2021   DERMOID CYST  EXCISION Bilateral    right side, left side 02/2011,  2014   DIAGNOSTIC LAPAROSCOPY     DILATION AND CURETTAGE OF UTERUS  10/25/2013   Procedure: DILATATION AND CURETTAGE;  Surgeon: Annamaria Boots, MD;  Location: [facility];  Service: Gynecology;;   I & D EXTREMITY Right 04/06/2013   Procedure: REMOVAL OF IMPLANT RIGHT ARM--IMPLANT DISCARDED;  Surgeon: Wyn Forster., MD;  Location: [facility];  Service: Orthopedics;  Laterality: Right;   LAPAROSCOPY Left 10/08/2012   Procedure: LAPAROSCOPY OPERATIVE;REMOVAL OF NEXPLANON AND  REPLACEMENT OF NEXPLANON DEVICE;  Surgeon: Ok Edwards, MD;  Location: [facility];  Service: Gynecology;  Laterality: Left;  left upper arm and right upper arm   LAPAROSCOPY N/A 10/25/2013   Procedure: LAPAROSCOPY DIAGNOSTIC WITH REMOVAL OF RIGHT PARATUBAL CYST,  CHROMOPERTUBATION, RIGHT OVARIAN CYST DRAINAGE, left hydrosalpinx,lysis of adhesions;  Surgeon: Annamaria Boots, MD;  Location: [facility];  Service: Gynecology;  Laterality: N/A;   nexplanon implant     stated 04/27/2020   nexplanon removed     UPPER GASTROINTESTINAL ENDOSCOPY  07/10/2021   Social History   Socioeconomic History   Marital status: Married    Spouse name: Not on file   Number of children: 3   Years of education: Not on file   Highest education level: Not on file  Occupational History   Occupation: RN  Tobacco Use   Smoking status: Never   Smokeless tobacco: Never  Vaping Use   Vaping status: Never Used  Substance and Sexual Activity   Alcohol use: Not Currently   Drug use: Never   Sexual activity: Yes    Partners: Male    Birth control/protection: Implant    Comment: Nexplanon placed 12/2019 left arm per patient  Other Topics Concern   Not on file  Social History Narrative   Not on file   Social Drivers of Health   Financial Resource Strain: Not on file  Food Insecurity: No Food Insecurity (04/22/2022)   [facility]   Hunger Vital Sign    Worried About Running Out of Food in the Last Year: Never true    Ran Out of Food in the Last Year: Never true  Transportation Needs: Not on file  Physical Activity: Not on file  Stress: Not on file  Social Connections: Unknown (04/11/2022)   [facility]   Social Network    Social Network: Not on file        Patient informed chaperone available to be present for breast and/or pelvic exam. Patient has requested no chaperone to be present. Patient has been advised what will be completed during breast and pelvic exam.   Assessment & Plan:  DUB, nexplanon in place, desires removal and placement of new nexplanon Encouraged patient to use a backup method until  new nexplanon is placed She will return for PUS, annual and removal and placement of nexplanon.  30 minutes spent on  reviewing records, imaging,  and one on one patient time and counseling patient and documentation Dr. Judith Blonder

## 2023-07-08 ENCOUNTER — Ambulatory Visit (INDEPENDENT_AMBULATORY_CARE_PROVIDER_SITE_OTHER): Payer: No Typology Code available for payment source

## 2023-07-08 ENCOUNTER — Other Ambulatory Visit: Payer: Self-pay | Admitting: Obstetrics and Gynecology

## 2023-07-08 DIAGNOSIS — D369 Benign neoplasm, unspecified site: Secondary | ICD-10-CM

## 2023-07-08 DIAGNOSIS — N926 Irregular menstruation, unspecified: Secondary | ICD-10-CM | POA: Diagnosis not present

## 2023-07-08 DIAGNOSIS — Z975 Presence of (intrauterine) contraceptive device: Secondary | ICD-10-CM

## 2023-07-10 ENCOUNTER — Encounter: Payer: Self-pay | Admitting: Obstetrics and Gynecology

## 2023-07-11 ENCOUNTER — Encounter: Payer: Self-pay | Admitting: Obstetrics and Gynecology

## 2023-07-11 ENCOUNTER — Ambulatory Visit: Payer: No Typology Code available for payment source | Admitting: Obstetrics and Gynecology

## 2023-07-11 VITALS — BP 110/78 | HR 66

## 2023-07-11 DIAGNOSIS — Z3046 Encounter for surveillance of implantable subdermal contraceptive: Secondary | ICD-10-CM | POA: Diagnosis not present

## 2023-07-11 DIAGNOSIS — Z30017 Encounter for initial prescription of implantable subdermal contraceptive: Secondary | ICD-10-CM

## 2023-07-11 DIAGNOSIS — Z01812 Encounter for preprocedural laboratory examination: Secondary | ICD-10-CM

## 2023-07-11 LAB — PREGNANCY, URINE: Preg Test, Ur: NEGATIVE

## 2023-07-11 MED ORDER — ETONOGESTREL 68 MG ~~LOC~~ IMPL
68.0000 mg | DRUG_IMPLANT | Freq: Once | SUBCUTANEOUS | Status: AC
Start: 2023-07-11 — End: 2023-07-11
  Administered 2023-07-11: 68 mg via SUBCUTANEOUS

## 2023-07-11 NOTE — Progress Notes (Signed)
 Patient presents today for nexplanon removal and reinsertion of new nexplanon. She would like to consider the [facility] possible ovarian dermoid cystectomy in the future, but waiting on finances to proceed. She is having severe pelvic pain and DUB.  Nexplanon Removal  Patient identified, informed consent performed, consent signed. Time out performed.  Nexplanon site identified in left arm.  Area prepped in usual sterile fashon. Ten ml of 1% lidocaine was used to anesthetize the area at the distal end of the implant. A small stab incision was made right beside the implant on the distal portion. The Nexplanon rod was grasped using hemostats and removed without difficulty. There was minimal blood loss. There were no complications. Patient insertion site covered with gauze and a pressure bandage to reduce any bruising.   Nexplanon Insertion Procedure Patient identified, informed consent performed, consent signed.   Patient does understand that irregular bleeding is a very common side effect of this medication. Pregnancy test in clinic today was negative.  Appropriate time out taken.  Patient's left arm was prepped and draped in the usual sterile fashion. The area of insertion was noted on the left arm.  She was prepped with betadine, Nexplanon removed from packaging,  Device confirmed in needle, then inserted full length of needle and withdrawn per handbook instructions. Nexplanon was able to palpated in the patient's arm; patient nexplanon palpated the insert herself. There was minimal blood loss.  Patient insertion site covered with guaze and a pressure bandage to reduce any bruising.  The patient tolerated the procedure well and was given post procedure instructions.    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  A new nexplanon was placed. Card was given. Post care instructions  and concerning s/s discussed.  Patient to return with any concerns   Dr. Karma Greaser
